# Patient Record
Sex: Male | Born: 1965 | Race: Black or African American | Hispanic: No | Marital: Single | Smoking: Former smoker
Health system: Southern US, Community
[De-identification: ages and names within clinical notes are randomized; demographics above are authoritative.]

## PROBLEM LIST (undated history)

## (undated) DIAGNOSIS — N289 Disorder of kidney and ureter, unspecified: Secondary | ICD-10-CM

## (undated) DIAGNOSIS — Z89519 Acquired absence of unspecified leg below knee: Secondary | ICD-10-CM

## (undated) DIAGNOSIS — M1990 Unspecified osteoarthritis, unspecified site: Secondary | ICD-10-CM

## (undated) DIAGNOSIS — F419 Anxiety disorder, unspecified: Secondary | ICD-10-CM

## (undated) DIAGNOSIS — G629 Polyneuropathy, unspecified: Secondary | ICD-10-CM

## (undated) DIAGNOSIS — E119 Type 2 diabetes mellitus without complications: Secondary | ICD-10-CM

## (undated) HISTORY — PX: REPLACEMENT TOTAL KNEE: SUR1224

---

## 2010-12-15 ENCOUNTER — Emergency Department (HOSPITAL_BASED_OUTPATIENT_CLINIC_OR_DEPARTMENT_OTHER)
Admission: EM | Admit: 2010-12-15 | Discharge: 2010-12-16 | Payer: Self-pay | Source: Home / Self Care | Admitting: Emergency Medicine

## 2010-12-16 ENCOUNTER — Inpatient Hospital Stay (HOSPITAL_COMMUNITY)
Admission: EM | Admit: 2010-12-16 | Discharge: 2010-12-18 | Payer: Self-pay | Attending: Internal Medicine | Admitting: Internal Medicine

## 2010-12-17 ENCOUNTER — Encounter (INDEPENDENT_AMBULATORY_CARE_PROVIDER_SITE_OTHER): Payer: Self-pay | Admitting: Internal Medicine

## 2011-01-10 NOTE — Discharge Summary (Addendum)
David Walsh, David Walsh                ACCOUNT NO.:  [id number]  MEDICAL RECORD NO.:  [id number]          PATIENT TYPE:  INP  LOCATION:  [id number]                         FACILITY:  [facility]  PHYSICIAN:  Romero Belling, MD       DATE OF BIRTH:  Oct 26, 1966  DATE OF ADMISSION:  12/16/2010 DATE OF DISCHARGE:  12/18/2010                        DISCHARGE SUMMARY - REFERRING   PRIMARY CARE PHYSICIAN:  At [facility].  DISCHARGE DIAGNOSES: 1. Hypertension. 2. Probable passed stone with right perinephric stranding, stable. 3. Mild rhabdomyolysis. 4. Type 2 diabetes mellitus. 5. Acute renal failure on chronic kidney disease. 6. Hypoalbuminemic state with possible nephrotic syndrome.  DISCHARGE MEDICATIONS:  For the patient are as follows: 1. Amlodipine 10 mg p.o. daily. 2. Hydralazine 25 mg p.o. 3 times a day. 3. K-Dur 20 mEq by mouth daily. 4. Lasix 40 mg p.o. daily. 5. Labetalol 200 mg by mouth twice daily. 6. NovoLog 70/30 twenty units subcutaneously 3 times a day.  DISPOSITION:  The patient is discharged home.  The patient should follow up with his primary care physician within 1 week.  The patient should obtain a referral to see a nephrologist.  The patient should establish care with a nephrologist, as the patient has chronic kidney disease and the patient could possibly have nephrotic syndrome.  Diet should be carbohydrate-modified.  PROCEDURES PERFORMED:  The patient had a CT scan of the abdomen and pelvis without contrast which showed mild right renal enlargement and perinephric stranding, but no hydronephrosis or radiopaque renal or ureteral calculus.  Findings could represent a recently passed stone or nonradiopaque calculus.  Correlate with urinalysis for possible evidence for infection that could suggest pyelonephritis.  The patient's chest x- ray 2-view showed cardiomegaly with probable mild pulmonary venous congestion.  The patient had an ultrasound of the  abdomen which showed no hydronephrosis in the right and left kidney.  CONSULTATIONS ON THIS CASE:  None.  INITIAL HISTORY AND PHYSICAL:  CHIEF COMPLAINT:  Right flank pain, hypertensive, hematuria.  This 45 year old male with a history of obesity, hypertension, and diabetes presents to [facility] with right flank pain.  The pain was nonradiating and was rather constant.  He has not had fevers or chills.  The patient had a CT scan of the abdomen and pelvis which showed stranding in the right perinephric area.  Urinalysis showed hematuria, as well as protein.  HOSPITAL COURSE: 1. Hypertension.  The patient had elevated blood pressure.  The     patient was receiving IV fluids early on the admission.  The     patient has been started on new antihypertensives with amlodipine     and hydralazine, as well as labetalol.  The patient's Lasix dose     has been increased from home. 2. Probable passed stone and right perinephric stranding.  This is     most likely a stone that has passed.  The patient should drink     plenty of fluids.  The patient had a CT and there was no evidence     of having any stone. 3. Mild rhabdomyolysis.  The  patient has mild rhabdomyolysis.  The     patient's CPK was elevated above 1000.  This should resolve with     time. 4. Type 2 diabetes mellitus.  The patient received Lantus and sliding     scale during the hospitalization.  The patient can go home and the     patient can resume his 70/30 insulin at home. 5. Acute renal failure on chronic kidney disease.  We do not have any     baseline kidney functions for the patient.  It is assumed that the     patient has chronic kidney disease.  The patient's creatinine has     ranged from 2.2 up to 2.31 during the hospitalization.  The patient     also has lower extremity edema.  The patient has had greater than     300 protein on the urinalysis.  Also the patient has a low albumin.     The patient had a 2-D  echocardiogram during this admission, which     showed a left ventricular ejection fraction that was normal and     some grade 1 diastolic dysfunction.  I believe the lower extremity     edema is most likely secondary to a hypoalbuminemic state.  This     could probably represent nephrotic syndrome.  That is why I am     recommending that the patient follow up with a nephrologist.     Romero Belling, MD     NH/MEDQ  D:  12/18/2010  T:  12/18/2010  Job:  [id number]  cc:   [facility]  Electronically Signed by Romero Belling MD on 01/10/2011 06:15:49 PM

## 2011-02-26 LAB — DRUGS OF ABUSE SCREEN W/O ALC, ROUTINE URINE
Benzodiazepines.: NEGATIVE
Cocaine Metabolites: NEGATIVE
Creatinine,U: 191.1 mg/dL
Methadone: NEGATIVE
Opiate Screen, Urine: NEGATIVE
Phencyclidine (PCP): NEGATIVE

## 2011-02-26 LAB — CBC
HCT: 31.8 % — ABNORMAL LOW (ref 39.0–52.0)
Hemoglobin: 10 g/dL — ABNORMAL LOW (ref 13.0–17.0)
Hemoglobin: 10.5 g/dL — ABNORMAL LOW (ref 13.0–17.0)
Hemoglobin: 10.8 g/dL — ABNORMAL LOW (ref 13.0–17.0)
MCH: 26.9 pg (ref 26.0–34.0)
MCV: 79.1 fL (ref 78.0–100.0)
MCV: 83 fL (ref 78.0–100.0)
Platelets: 246 10*3/uL (ref 150–400)
RBC: 3.64 MIL/uL — ABNORMAL LOW (ref 4.22–5.81)
RBC: 3.83 MIL/uL — ABNORMAL LOW (ref 4.22–5.81)
RBC: 4.02 MIL/uL — ABNORMAL LOW (ref 4.22–5.81)
WBC: 8.3 10*3/uL (ref 4.0–10.5)
WBC: 8.6 10*3/uL (ref 4.0–10.5)

## 2011-02-26 LAB — BASIC METABOLIC PANEL
CO2: 26 mEq/L (ref 19–32)
Chloride: 107 mEq/L (ref 96–112)
GFR calc Af Amer: 37 mL/min — ABNORMAL LOW (ref >60)
GFR calc Af Amer: 39 mL/min — ABNORMAL LOW (ref >60)
GFR calc non Af Amer: 30 mL/min — ABNORMAL LOW (ref >60)
Potassium: 3.5 mEq/L (ref 3.5–5.1)
Sodium: 138 mEq/L (ref 135–145)
Sodium: 139 mEq/L (ref 135–145)

## 2011-02-26 LAB — IRON AND TIBC
Iron: 37 ug/dL — ABNORMAL LOW (ref 42–135)
Saturation Ratios: 15 % — ABNORMAL LOW (ref 20–55)
TIBC: 243 ug/dL (ref 215–435)

## 2011-02-26 LAB — COMPREHENSIVE METABOLIC PANEL
ALT: 25 U/L (ref 0–53)
Albumin: 2 g/dL — ABNORMAL LOW (ref 3.5–5.2)
Alkaline Phosphatase: 73 U/L (ref 39–117)
Chloride: 108 mEq/L (ref 96–112)
Potassium: 3.4 mEq/L — ABNORMAL LOW (ref 3.5–5.1)
Sodium: 138 mEq/L (ref 135–145)
Total Protein: 5.2 g/dL — ABNORMAL LOW (ref 6.0–8.3)

## 2011-02-26 LAB — URINALYSIS, ROUTINE W REFLEX MICROSCOPIC
Bilirubin Urine: NEGATIVE
Protein, ur: >300 mg/dL — AB
Urobilinogen, UA: 0.2 mg/dL (ref 0.0–1.0)

## 2011-02-26 LAB — CARDIAC PANEL(CRET KIN+CKTOT+MB+TROPI)
CK, MB: 13.6 ng/mL (ref 0.3–4.0)
CK, MB: 14.6 ng/mL (ref 0.3–4.0)
Total CK: 1611 U/L — ABNORMAL HIGH (ref 7–232)
Total CK: 1745 U/L — ABNORMAL HIGH (ref 7–232)
Troponin I: 0.05 ng/mL (ref 0.00–0.06)
Troponin I: 0.05 ng/mL (ref 0.00–0.06)

## 2011-02-26 LAB — RETICULOCYTES: Retic Ct Pct: 1.2 % (ref 0.4–3.1)

## 2011-02-26 LAB — HEPATIC FUNCTION PANEL
Albumin: 2.3 g/dL — ABNORMAL LOW (ref 3.5–5.2)
Total Bilirubin: 0.2 mg/dL — ABNORMAL LOW (ref 0.3–1.2)
Total Protein: 5.4 g/dL — ABNORMAL LOW (ref 6.0–8.3)

## 2011-02-26 LAB — DIFFERENTIAL
Basophils Relative: 0 % (ref 0–1)
Eosinophils Absolute: 0.3 10*3/uL (ref 0.0–0.7)
Eosinophils Absolute: 0.3 10*3/uL (ref 0.0–0.7)
Eosinophils Relative: 3 % (ref 0–5)
Eosinophils Relative: 3 % (ref 0–5)
Lymphs Abs: 2.1 10*3/uL (ref 0.7–4.0)
Monocytes Absolute: 0.7 10*3/uL (ref 0.1–1.0)
Monocytes Relative: 8 % (ref 3–12)
Monocytes Relative: 9 % (ref 3–12)

## 2011-02-26 LAB — FERRITIN: Ferritin: 84 ng/mL (ref 22–322)

## 2011-02-26 LAB — GLUCOSE, CAPILLARY
Glucose-Capillary: 147 mg/dL — ABNORMAL HIGH (ref 70–99)
Glucose-Capillary: 179 mg/dL — ABNORMAL HIGH (ref 70–99)

## 2011-02-26 LAB — HEMOGLOBIN A1C
Hgb A1c MFr Bld: 7.7 % — ABNORMAL HIGH (ref <5.7)
Mean Plasma Glucose: 174 mg/dL — ABNORMAL HIGH (ref <117)

## 2011-02-26 LAB — URINE MICROSCOPIC-ADD ON

## 2011-02-26 LAB — LIPID PANEL
LDL Cholesterol: 226 mg/dL — ABNORMAL HIGH (ref 0–99)
VLDL: 26 mg/dL (ref 0–40)

## 2011-02-26 LAB — FOLATE: Folate: 5.7 ng/mL

## 2011-02-26 LAB — CK: Total CK: 1963 U/L — ABNORMAL HIGH (ref 7–232)

## 2011-02-26 LAB — VITAMIN B12: Vitamin B-12: 330 pg/mL (ref 211–911)

## 2012-12-17 HISTORY — PX: BELOW KNEE LEG AMPUTATION: SUR23

## 2016-12-28 DIAGNOSIS — Z7682 Awaiting organ transplant status: Secondary | ICD-10-CM | POA: Diagnosis not present

## 2017-03-06 DIAGNOSIS — Z7682 Awaiting organ transplant status: Secondary | ICD-10-CM | POA: Diagnosis not present

## 2018-07-15 ENCOUNTER — Emergency Department (HOSPITAL_COMMUNITY): Payer: Medicare Other

## 2018-07-15 ENCOUNTER — Inpatient Hospital Stay (HOSPITAL_COMMUNITY)
Admission: EM | Admit: 2018-07-15 | Discharge: 2018-07-23 | DRG: 602 | Disposition: A | Discharge status: Home / Self Care | Payer: Medicare Other | Attending: Family Medicine | Admitting: Family Medicine

## 2018-07-15 ENCOUNTER — Other Ambulatory Visit: Payer: Self-pay

## 2018-07-15 ENCOUNTER — Encounter (HOSPITAL_COMMUNITY): Payer: Self-pay | Admitting: Emergency Medicine

## 2018-07-15 DIAGNOSIS — Z4901 Encounter for fitting and adjustment of extracorporeal dialysis catheter: Secondary | ICD-10-CM | POA: Diagnosis not present

## 2018-07-15 DIAGNOSIS — Z89511 Acquired absence of right leg below knee: Secondary | ICD-10-CM | POA: Diagnosis not present

## 2018-07-15 DIAGNOSIS — I10 Essential (primary) hypertension: Secondary | ICD-10-CM | POA: Diagnosis present

## 2018-07-15 DIAGNOSIS — M898X9 Other specified disorders of bone, unspecified site: Secondary | ICD-10-CM | POA: Diagnosis present

## 2018-07-15 DIAGNOSIS — Z880 Allergy status to penicillin: Secondary | ICD-10-CM

## 2018-07-15 DIAGNOSIS — D631 Anemia in chronic kidney disease: Secondary | ICD-10-CM | POA: Diagnosis present

## 2018-07-15 DIAGNOSIS — G8929 Other chronic pain: Secondary | ICD-10-CM | POA: Diagnosis present

## 2018-07-15 DIAGNOSIS — L02212 Cutaneous abscess of back [any part, except buttock]: Principal | ICD-10-CM | POA: Diagnosis present

## 2018-07-15 DIAGNOSIS — E1165 Type 2 diabetes mellitus with hyperglycemia: Secondary | ICD-10-CM | POA: Diagnosis present

## 2018-07-15 DIAGNOSIS — L0291 Cutaneous abscess, unspecified: Secondary | ICD-10-CM | POA: Diagnosis not present

## 2018-07-15 DIAGNOSIS — R109 Unspecified abdominal pain: Secondary | ICD-10-CM | POA: Diagnosis present

## 2018-07-15 DIAGNOSIS — T827XXA Infection and inflammatory reaction due to other cardiac and vascular devices, implants and grafts, initial encounter: Secondary | ICD-10-CM

## 2018-07-15 DIAGNOSIS — E1142 Type 2 diabetes mellitus with diabetic polyneuropathy: Secondary | ICD-10-CM | POA: Diagnosis present

## 2018-07-15 DIAGNOSIS — B957 Other staphylococcus as the cause of diseases classified elsewhere: Secondary | ICD-10-CM | POA: Diagnosis present

## 2018-07-15 DIAGNOSIS — R7881 Bacteremia: Secondary | ICD-10-CM

## 2018-07-15 DIAGNOSIS — R1032 Left lower quadrant pain: Secondary | ICD-10-CM | POA: Diagnosis not present

## 2018-07-15 DIAGNOSIS — A419 Sepsis, unspecified organism: Secondary | ICD-10-CM | POA: Diagnosis not present

## 2018-07-15 DIAGNOSIS — N19 Unspecified kidney failure: Secondary | ICD-10-CM

## 2018-07-15 DIAGNOSIS — I120 Hypertensive chronic kidney disease with stage 5 chronic kidney disease or end stage renal disease: Secondary | ICD-10-CM | POA: Diagnosis not present

## 2018-07-15 DIAGNOSIS — N186 End stage renal disease: Secondary | ICD-10-CM | POA: Diagnosis present

## 2018-07-15 DIAGNOSIS — F17290 Nicotine dependence, other tobacco product, uncomplicated: Secondary | ICD-10-CM | POA: Diagnosis not present

## 2018-07-15 DIAGNOSIS — R1030 Lower abdominal pain, unspecified: Secondary | ICD-10-CM | POA: Diagnosis not present

## 2018-07-15 DIAGNOSIS — Z794 Long term (current) use of insulin: Secondary | ICD-10-CM

## 2018-07-15 DIAGNOSIS — Z6833 Body mass index (BMI) 33.0-33.9, adult: Secondary | ICD-10-CM | POA: Diagnosis not present

## 2018-07-15 DIAGNOSIS — E1122 Type 2 diabetes mellitus with diabetic chronic kidney disease: Secondary | ICD-10-CM | POA: Diagnosis not present

## 2018-07-15 DIAGNOSIS — Z992 Dependence on renal dialysis: Secondary | ICD-10-CM | POA: Diagnosis not present

## 2018-07-15 DIAGNOSIS — L02219 Cutaneous abscess of trunk, unspecified: Secondary | ICD-10-CM | POA: Diagnosis not present

## 2018-07-15 DIAGNOSIS — N62 Hypertrophy of breast: Secondary | ICD-10-CM | POA: Diagnosis present

## 2018-07-15 DIAGNOSIS — E669 Obesity, unspecified: Secondary | ICD-10-CM | POA: Diagnosis present

## 2018-07-15 DIAGNOSIS — Z91013 Allergy to seafood: Secondary | ICD-10-CM | POA: Diagnosis not present

## 2018-07-15 DIAGNOSIS — B9689 Other specified bacterial agents as the cause of diseases classified elsewhere: Secondary | ICD-10-CM | POA: Diagnosis not present

## 2018-07-15 DIAGNOSIS — N2581 Secondary hyperparathyroidism of renal origin: Secondary | ICD-10-CM | POA: Diagnosis present

## 2018-07-15 DIAGNOSIS — I5030 Unspecified diastolic (congestive) heart failure: Secondary | ICD-10-CM | POA: Diagnosis not present

## 2018-07-15 DIAGNOSIS — I4891 Unspecified atrial fibrillation: Secondary | ICD-10-CM | POA: Diagnosis present

## 2018-07-15 DIAGNOSIS — L0390 Cellulitis, unspecified: Secondary | ICD-10-CM | POA: Diagnosis present

## 2018-07-15 DIAGNOSIS — E1129 Type 2 diabetes mellitus with other diabetic kidney complication: Secondary | ICD-10-CM

## 2018-07-15 DIAGNOSIS — E039 Hypothyroidism, unspecified: Secondary | ICD-10-CM | POA: Diagnosis present

## 2018-07-15 DIAGNOSIS — R1031 Right lower quadrant pain: Secondary | ICD-10-CM | POA: Diagnosis not present

## 2018-07-15 HISTORY — DX: Disorder of kidney and ureter, unspecified: N28.9

## 2018-07-15 HISTORY — DX: Anxiety disorder, unspecified: F41.9

## 2018-07-15 HISTORY — DX: Polyneuropathy, unspecified: G62.9

## 2018-07-15 HISTORY — DX: Type 2 diabetes mellitus without complications: E11.9

## 2018-07-15 HISTORY — DX: Unspecified osteoarthritis, unspecified site: M19.90

## 2018-07-15 HISTORY — DX: Acquired absence of unspecified leg below knee: Z89.519

## 2018-07-15 LAB — CBC WITH DIFFERENTIAL/PLATELET
BASOS ABS: 0 10*3/uL (ref 0.0–0.1)
BASOS PCT: 0 %
EOS PCT: 4 %
Eosinophils Absolute: 0.3 10*3/uL (ref 0.0–0.7)
HCT: 36.1 % — ABNORMAL LOW (ref 39.0–52.0)
Hemoglobin: 12.3 g/dL — ABNORMAL LOW (ref 13.0–17.0)
Lymphocytes Relative: 21 %
Lymphs Abs: 1.5 10*3/uL (ref 0.7–4.0)
MCH: 29.8 pg (ref 26.0–34.0)
MCHC: 34.1 g/dL (ref 30.0–36.0)
MCV: 87.4 fL (ref 78.0–100.0)
MONO ABS: 0.4 10*3/uL (ref 0.1–1.0)
Monocytes Relative: 5 %
Neutro Abs: 4.8 10*3/uL (ref 1.7–7.7)
Neutrophils Relative %: 70 %
PLATELETS: 262 10*3/uL (ref 150–400)
RBC: 4.13 MIL/uL — AB (ref 4.22–5.81)
RDW: 14.1 % (ref 11.5–15.5)
WBC: 6.9 10*3/uL (ref 4.0–10.5)

## 2018-07-15 LAB — LIPASE, BLOOD: LIPASE: 54 U/L — AB (ref 11–51)

## 2018-07-15 LAB — COMPREHENSIVE METABOLIC PANEL
ALBUMIN: 3.5 g/dL (ref 3.5–5.0)
ALK PHOS: 139 U/L — AB (ref 38–126)
ALT: 16 U/L (ref 0–44)
AST: 23 U/L (ref 15–41)
Anion gap: 12 (ref 5–15)
BILIRUBIN TOTAL: 0.5 mg/dL (ref 0.3–1.2)
BUN: 43 mg/dL — AB (ref 6–20)
CALCIUM: 9.1 mg/dL (ref 8.9–10.3)
CO2: 25 mmol/L (ref 22–32)
Chloride: 95 mmol/L — ABNORMAL LOW (ref 98–111)
Creatinine, Ser: 7.86 mg/dL — ABNORMAL HIGH (ref 0.61–1.24)
GFR calc Af Amer: 8 mL/min — ABNORMAL LOW (ref >60)
GFR, EST NON AFRICAN AMERICAN: 7 mL/min — AB (ref >60)
GLUCOSE: 339 mg/dL — AB (ref 70–99)
Potassium: 5 mmol/L (ref 3.5–5.1)
Sodium: 132 mmol/L — ABNORMAL LOW (ref 135–145)
TOTAL PROTEIN: 7.7 g/dL (ref 6.5–8.1)

## 2018-07-15 LAB — I-STAT CG4 LACTIC ACID, ED
LACTIC ACID, VENOUS: 1.94 mmol/L — AB (ref 0.5–1.9)
Lactic Acid, Venous: 1.63 mmol/L (ref 0.5–1.9)

## 2018-07-15 MED ORDER — SODIUM CHLORIDE 0.9 % IV SOLN
2.0000 g | Freq: Every day | INTRAVENOUS | Status: DC
  Administered 2018-07-16: 2 g via INTRAVENOUS
  Filled 2018-07-15 (×2): qty 20

## 2018-07-15 MED ORDER — LEVOFLOXACIN IN D5W 750 MG/150ML IV SOLN
750.0000 mg | Freq: Once | INTRAVENOUS | Status: AC
  Administered 2018-07-15: 750 mg via INTRAVENOUS
  Filled 2018-07-15: qty 150

## 2018-07-15 MED ORDER — FENTANYL CITRATE (PF) 100 MCG/2ML IJ SOLN
50.0000 ug | Freq: Once | INTRAMUSCULAR | Status: AC
  Administered 2018-07-15: 50 ug via INTRAVENOUS
  Filled 2018-07-15: qty 2

## 2018-07-15 MED ORDER — VANCOMYCIN HCL IN DEXTROSE 1-5 GM/200ML-% IV SOLN
1000.0000 mg | Freq: Once | INTRAVENOUS | Status: AC
  Administered 2018-07-15: 1000 mg via INTRAVENOUS
  Filled 2018-07-15: qty 200

## 2018-07-15 MED ORDER — SODIUM CHLORIDE 0.9 % IV SOLN
2.0000 g | Freq: Once | INTRAVENOUS | Status: AC
  Administered 2018-07-15: 2 g via INTRAVENOUS
  Filled 2018-07-15: qty 2

## 2018-07-15 MED ORDER — LIDOCAINE-EPINEPHRINE (PF) 2 %-1:200000 IJ SOLN
10.0000 mL | Freq: Once | INTRAMUSCULAR | Status: AC
  Administered 2018-07-15: 10 mL
  Filled 2018-07-15: qty 20

## 2018-07-15 NOTE — ED Notes (Signed)
Turkey sandwich given to pt.  

## 2018-07-15 NOTE — H&P (Signed)
History and Physical    David Walsh [id number] DOB: 03-30-1966 DOA: 07/15/2018  PCP: [facility]   Patient coming from: Home   Chief Complaint: Abdominal Pain, flank pain, back abscess.  HPI: David Walsh is a 52 y.o. male with medical history significant for DM, HTN, ESRD, BKA, to the ED today with multiple complaints.  Patient reports abdominal pain, RLQ of 1 month duration with associated localized swelling, at the site where he had HD access in the recent past.  Patient also reports left lower abdominal pain radiating to his groin of about 1 week duration. No back pain. He denies fevers, or shaking chills.  Patient also c/o back abscess of 2 months duration foer which he has at least 2 courses of antibiotics given at the [place] and [place] or [place]. He cannot remember the name of the medications.  ED Course: tachycardia- 109, initial tachypnea- 22, Ct abd - No acute abnormality, new L2 sub-centimeter lucent foci- ?brown tumors in the setting of ESRD (see detailed report). 2 view chest xray- clear.  Lactic acid- 1.9>> 1.63.  Patient was started on broad-spectrum antibiotics-IV Vanco IV aztreonam and IV Levaquin, for initial concern for sepsis.  Patient had I&D done of his back abscess, with purulent drainage.  Sample cultures pending.   Review of Systems: As per HPI otherwise other systems reviewed and are negative and are negative.   Past Medical History:  Diagnosis Date  . Anxiety   . Diabetes mellitus without complication ([place])   . Hx of BKA ([place])   . Neuropathy   . Renal disorder     Allergies  Allergen Reactions  . Penicillins   . Shellfish Allergy    Family history of hypertension  Prior to Admission medications   Not on File    Physical Exam: Vitals:   07/15/18 1741 07/15/18 1800 07/15/18 1830 07/15/18 1930  BP: (!) 164/79 (!) 155/82 (!) 141/89 (!) 156/93  Pulse: 99 99 96 (!) 102  Resp: 16   18  Temp:      TempSrc:      SpO2: 94% 93% 94% 97%  Weight:       Height:        Constitutional: NAD, calm, comfortable Vitals:   07/15/18 1741 07/15/18 1800 07/15/18 1830 07/15/18 1930  BP: (!) 164/79 (!) 155/82 (!) 141/89 (!) 156/93  Pulse: 99 99 96 (!) 102  Resp: 16   18  Temp:      TempSrc:      SpO2: 94% 93% 94% 97%  Weight:      Height:       Eyes: PERRL, lids and conjunctivae normal ENMT: Mucous membranes are moist. Posterior pharynx clear of any exudate or lesions.Normal dentition.  Neck: normal, supple, no masses, no thyromegaly Respiratory: clear to auscultation bilaterally, no wheezing, no crackles. Normal respiratory effort. No accessory muscle use.  HD access site right upper chest, without surrounding erythema or tenderness. Cardiovascular: Regular rate and rhythm, no murmurs / rubs / gallops. No extremity edema. 2+ pedal pulses. No carotid bruits.  Back-    ~3 cm wound middle upper back, with mild surrounding erythema and tenderness, no fluctuance appreciated.  Abdomen: no tenderness, no masses palpated. No hepatosplenomegaly. Bowel sounds positive.  Musculoskeletal: no clubbing / cyanosis. Right BKA. Skin: no rashes, lesions, ulcers. No induration Neurologic: CN 2-12 grossly intact. Strength 5/5 in all 3-upper and left lower extremities.  Psychiatric: Normal judgment and insight. Alert and oriented x 3. Normal mood.  Labs on Admission: I have personally reviewed following labs and imaging studies  CBC: Recent Labs  Lab 07/15/18 1643  WBC 6.9  NEUTROABS 4.8  HGB 12.3*  HCT 36.1*  MCV 87.4  PLT 852   Basic Metabolic Panel: Recent Labs  Lab 07/15/18 1643  NA 132*  K 5.0  CL 95*  CO2 25  GLUCOSE 339*  BUN 43*  CREATININE 7.86*  CALCIUM 9.1   Liver Function Tests: Recent Labs  Lab 07/15/18 1643  AST 23  ALT 16  ALKPHOS 139*  BILITOT 0.5  PROT 7.7  ALBUMIN 3.5   Recent Labs  Lab 07/15/18 1643  LIPASE 54*   Urine analysis:    Component Value Date/Time   COLORURINE YELLOW 12/15/2010 2309    APPEARANCEUR CLEAR 12/15/2010 2309   LABSPEC 1.021 12/15/2010 2309   PHURINE 6.5 12/15/2010 2309   GLUCOSEU 500 (A) 12/15/2010 2309   HGBUR LARGE (A) 12/15/2010 2309   BILIRUBINUR NEGATIVE 12/15/2010 2309   KETONESUR NEGATIVE 12/15/2010 2309   PROTEINUR >300 (A) 12/15/2010 2309   UROBILINOGEN 0.2 12/15/2010 2309   NITRITE NEGATIVE 12/15/2010 2309   LEUKOCYTESUR NEGATIVE 12/15/2010 2309    Radiological Exams on Admission: Ct Abdomen Pelvis Wo Contrast  Result Date: 07/15/2018 CLINICAL DATA:  52 y/o M; right lower quadrant abdominal pain radiating to the left flank for 2 months. Reports mass to the right abdomen. EXAM: CT ABDOMEN AND PELVIS WITHOUT CONTRAST TECHNIQUE: Multidetector CT imaging of the abdomen and pelvis was performed following the standard protocol without IV contrast. COMPARISON:  12/16/2010 CT abdomen and pelvis. FINDINGS: Lower chest: Severe coronary artery calcific atherosclerosis and dense mitral annular calcification. Right central venous catheter tip projects over the right atrium. 2-3 mm nodules at the lung bases, some obscured by atelectasis on the prior CT. Hepatobiliary: No focal liver abnormality is seen. No gallstones, gallbladder wall thickening, or biliary dilatation. Pancreas: Unremarkable. No pancreatic ductal dilatation or surrounding inflammatory changes. Spleen: Normal in size without focal abnormality. Adrenals/Urinary Tract: Adrenal glands are unremarkable. Atrophic kidneys bilaterally. Kidneys are otherwise normal, without renal calculi, focal lesion, or hydronephrosis. Bladder is unremarkable. Stomach/Bowel: Stomach is within normal limits. Appendix appears normal. No evidence of bowel wall thickening, distention, or inflammatory changes. Vascular/Lymphatic: Aortic atherosclerosis. No enlarged abdominal or pelvic lymph nodes. Reproductive: Prostate is unremarkable. Other: No abdominal wall hernia or abnormality. No abdominopelvic ascites. Musculoskeletal: New L2  vertebral body subcentimeter lucent foci (series 4, image 90). No acute fracture. Increased density of bones. IMPRESSION: 1. No acute process identified. 2. 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 Differential includes lytic metastasis or myeloma. No direct connection to the disc space identified to suggest Schmorl's node. Follow-up recommended to ensure stability. 3. Severe coronary and aortic calcific atherosclerosis. Electronically Signed   By: Kristine Garbe M.D.   On: 07/15/2018 17:46   Dg Chest 2 View  Result Date: 07/15/2018 CLINICAL DATA:  Sepsis.  Right lower quadrant pain. EXAM: CHEST - 2 VIEW COMPARISON:  12/16/2010 FINDINGS: There is a right jugular dialysis catheter with the tip extending into the right atrium. Low lung volumes. Left basilar densities are most compatible with atelectasis/scar and may be chronic. Cardiac silhouette appears to be within normal limits and stable but poorly characterized on this low lung volume examination. Negative for pneumothorax. No significant airspace disease or pulmonary edema. No large pleural effusions. IMPRESSION: Low lung volumes without acute findings. Presence of a dialysis catheter. Electronically Signed  By: Markus Daft M.D.   On: 07/15/2018 17:07    EKG: Independently reviewed.  Polarization abnormalities no old EKG to compare.  QTc 484  Assessment/Plan Principal Problem:   Abscess Active Problems:   DM (diabetes mellitus) (HCC)   HTN (hypertension)   ESRD (end stage renal disease) (HCC)   Abdominal pain   Hx of BKA, right (HCC)   Abscess-2 months duration.  Failed outpatient antibiotic therapy. Purulent drainage status post I&D in the ED. WBC- 6.9.  Lactic acid 1.9.> 1.6.  diabetic.  -Follow-up cultures from I&D -Follow blood cultures -IV Vanco per pharmacy and IV ceftriaxone (prior penicillin allergy- per patient, itching and rash, noted)  Abdominal  pain-CT abdomen and pelvis -no acute abnormality (detailed report), right lower quadrant pain possibly related to scarring from recent HD access.  Also no tenderness to explain pain in left groin. 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 Differential includes lytic metastasis or myeloma. No direct connection to the disc space identified to suggest Schmorl's node. Follow-up recommended to ensure stability.  ESRD- T TH sat.  Last HD session today.  Patient states he would like to have [facility] next [facility] on Thursday, even if he is discharged at day -Nephrology consult in a.m.  DM-  Glucose elevated 339.  Patient reports he is on sliding scale at home -Hgb A1c - SSi- s  HTN-elevated.  Reports he is off antihypertensives. -Monitor   DVT prophylaxis: Lovenox Code Status: full Family Communication: Spouse at bedside Disposition Plan:  Per rounding team Consults called: None Admission status: inpt, tele   Bethena Roys MD [facility] Pager [phone] From 6PM-2AM.  Otherwise please contact night-coverage [email or web address] Password [facility]  07/15/2018, 9:51 PM

## 2018-07-15 NOTE — ED Notes (Signed)
Attempted to call report. Receiving RN was not available at this time.

## 2018-07-15 NOTE — ED Triage Notes (Addendum)
Patient c/o RLQ pain radiating to left flank x2 months.Reports mass to right abdomen. Reports he had dialysis today. Also adds intermittent N/V/D x2 months. Patient also c/o abscess to upper back.

## 2018-07-15 NOTE — Progress Notes (Signed)
A consult was received from an ED physician for vancomycin, aztreonam, and levofloxacin per pharmacy dosing.  The patient's profile has been reviewed for ht/wt/allergies/indication/available labs.   Height/weight not currently in the chart, ordered as STAT  A one time order has been placed for levofloxacin 750 mg IV, vancomycin 1000 mg IV, and aztreonam 2 g IV.  Further antibiotics/pharmacy consults should be ordered by admitting physician if indicated.                       Thank you, Lenis Noon, PharmD 07/15/2018  4:23 PM

## 2018-07-15 NOTE — ED Notes (Signed)
Patient refused rectal temp

## 2018-07-15 NOTE — ED Provider Notes (Signed)
[facility] DEPT Provider Note   CSN: [id number] Arrival date & time: 07/15/18  1423     History   Chief Complaint Chief Complaint  Patient presents with  . Flank Pain  . Abdominal Pain    HPI David Walsh is a 52 y.o. male with past medical history of DM, anxiety, BKA, ESRD on HD, who presents today for evaluation of abdominal pain, flank pain, and abscess.  His abdominal pain is in the right lower quadrant.  It is been present for 2 months however became significantly worse over the past few days.  He reports intermittent nausea.  He was able to attend dialysis this morning for his entire session.  He reports that he has had multiple CAT scans recently of this area, however he reports the only thing he knows is that they were fine.  He does not know if he has a hernia in this area.  He used to have peritoneal dialysis access in this area.  He reports that he feels like he might be constipated, however reports last bowel movement was this morning.  He does report that he still makes small amounts of urine.  Left-sided flank pain: He reports that this is been present for the past few days and gradually worsening.  He reports that pain in this area is unusual for him.  Abscess.  He reports that he has a wound that is draining pus on his upper back.  This is been present for over a month.  He has been treated with 2 different antibiotics by different facilities and it has not improved.  He reports that he is supposed to go back this week for recheck.  He has never had it drained.    HPI  Past Medical History:  Diagnosis Date  . Anxiety   . Diabetes mellitus without complication ([place])   . Hx of BKA ([place])   . Neuropathy   . Renal disorder     Patient Active Problem List   Diagnosis Date Noted  . Abscess 07/15/2018  . DM (diabetes mellitus) ([place]) 07/15/2018  . HTN (hypertension) 07/15/2018  . ESRD (end stage renal disease) ([place]) 07/15/2018  .  Abdominal pain 07/15/2018  . Hx of BKA, right ([place]) 07/15/2018    History reviewed. No pertinent surgical history.      Home Medications    Prior to Admission medications   Not on File    Family History History reviewed. No pertinent family history.  Social History Social History   Tobacco Use  . Smoking status: Not on file  Substance Use Topics  . Alcohol use: Not on file  . Drug use: Not on file     Allergies   Penicillins and Shellfish allergy   Review of Systems Review of Systems  Constitutional: Positive for activity change, appetite change and chills. Negative for fever.  Respiratory: Negative for shortness of breath and wheezing.   Cardiovascular: Negative for chest pain.  Gastrointestinal: Positive for abdominal pain, constipation and nausea. Negative for diarrhea and vomiting.  Genitourinary: Negative for decreased urine volume.  Neurological: Negative for weakness.  All other systems reviewed and are negative.    Physical Exam Updated Vital Signs BP (!) 160/83   Pulse (!) 101   Temp 97.9 F (36.6 C) (Oral)   Resp 18   Ht 6\' 6"  (1.981 m)   Wt 126.6 kg (279 lb)   SpO2 92%   BMI 32.24 kg/m   Physical Exam  Constitutional:  He appears well-developed and well-nourished. No distress.  HENT:  Head: Normocephalic and atraumatic.  Eyes: Conjunctivae are normal. Right eye exhibits no discharge. Left eye exhibits no discharge. No scleral icterus.  Neck: Normal range of motion.  Cardiovascular: Normal rate and regular rhythm.  Pulmonary/Chest: Effort normal. No stridor. No respiratory distress.  Abdominal: Bowel sounds are normal. He exhibits no distension. There is generalized tenderness. There is no rigidity, no rebound and no guarding.  Abdomen is obese.  There is swelling and area of scarring in the right lower quadrant.  Palpation in this area does not reduce it.  Abdomen is generally tender to palpation.  There is no CVA tenderness to percussion.    Musculoskeletal: He exhibits no edema or deformity.  Right-sided BKA.  Neurological: He is alert. He exhibits normal muscle tone.  Skin: Skin is warm and dry. He is not diaphoretic.  There is a wound in patient's upper back.  There is purulent material slowly draining from the wound.  Mild surrounding fluctuance without abnormal erythema.  Psychiatric: He has a normal mood and affect. His behavior is normal.  Nursing note and vitals reviewed.    ED Treatments / Results  Labs (all labs ordered are listed, but only abnormal results are displayed) Labs Reviewed  COMPREHENSIVE METABOLIC PANEL - Abnormal; Notable for the following components:      Result Value   Sodium 132 (*)    Chloride 95 (*)    Glucose, Bld 339 (*)    BUN 43 (*)    Creatinine, Ser 7.86 (*)    Alkaline Phosphatase 139 (*)    GFR calc non Af Amer 7 (*)    GFR calc Af Amer 8 (*)    All other components within normal limits  CBC WITH DIFFERENTIAL/PLATELET - Abnormal; Notable for the following components:   RBC 4.13 (*)    Hemoglobin 12.3 (*)    HCT 36.1 (*)    All other components within normal limits  LIPASE, BLOOD - Abnormal; Notable for the following components:   Lipase 54 (*)    All other components within normal limits  I-STAT CG4 LACTIC ACID, ED - Abnormal; Notable for the following components:   Lactic Acid, Venous 1.94 (*)    All other components within normal limits  CULTURE, BLOOD (ROUTINE X 2)  CULTURE, BLOOD (ROUTINE X 2)  AEROBIC/ANAEROBIC CULTURE (SURGICAL/DEEP WOUND)  URINALYSIS, ROUTINE W REFLEX MICROSCOPIC  I-STAT CG4 LACTIC ACID, ED    EKG None  Radiology Ct Abdomen Pelvis Wo Contrast  Result Date: 07/15/2018 CLINICAL DATA:  52 y/o M; right lower quadrant abdominal pain radiating to the left flank for 2 months. Reports mass to the right abdomen. EXAM: CT ABDOMEN AND PELVIS WITHOUT CONTRAST TECHNIQUE: Multidetector CT imaging of the abdomen and pelvis was performed following the  standard protocol without IV contrast. COMPARISON:  12/16/2010 CT abdomen and pelvis. FINDINGS: Lower chest: Severe coronary artery calcific atherosclerosis and dense mitral annular calcification. Right central venous catheter tip projects over the right atrium. 2-3 mm nodules at the lung bases, some obscured by atelectasis on the prior CT. Hepatobiliary: No focal liver abnormality is seen. No gallstones, gallbladder wall thickening, or biliary dilatation. Pancreas: Unremarkable. No pancreatic ductal dilatation or surrounding inflammatory changes. Spleen: Normal in size without focal abnormality. Adrenals/Urinary Tract: Adrenal glands are unremarkable. Atrophic kidneys bilaterally. Kidneys are otherwise normal, without renal calculi, focal lesion, or hydronephrosis. Bladder is unremarkable. Stomach/Bowel: Stomach is within normal limits. Appendix appears normal. No evidence  of bowel wall thickening, distention, or inflammatory changes. Vascular/Lymphatic: Aortic atherosclerosis. No enlarged abdominal or pelvic lymph nodes. Reproductive: Prostate is unremarkable. Other: No abdominal wall hernia or abnormality. No abdominopelvic ascites. Musculoskeletal: New L2 vertebral body subcentimeter lucent foci (series 4, image 90). No acute fracture. Increased density of bones. IMPRESSION: 1. No acute process identified. 2. 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 Differential includes lytic metastasis or myeloma. No direct connection to the disc space identified to suggest Schmorl's node. Follow-up recommended to ensure stability. 3. Severe coronary and aortic calcific atherosclerosis. Electronically Signed   By: Kristine Garbe M.D.   On: 07/15/2018 17:46   Dg Chest 2 View  Result Date: 07/15/2018 CLINICAL DATA:  Sepsis.  Right lower quadrant pain. EXAM: CHEST - 2 VIEW COMPARISON:  12/16/2010 FINDINGS: There is a right jugular dialysis  catheter with the tip extending into the right atrium. Low lung volumes. Left basilar densities are most compatible with atelectasis/scar and may be chronic. Cardiac silhouette appears to be within normal limits and stable but poorly characterized on this low lung volume examination. Negative for pneumothorax. No significant airspace disease or pulmonary edema. No large pleural effusions. IMPRESSION: Low lung volumes without acute findings. Presence of a dialysis catheter. Electronically Signed   By: Markus Daft M.D.   On: 07/15/2018 17:07    Procedures .Marland KitchenIncision and Drainage Date/Time: 07/15/2018 7:30 PM Performed by: Lorin Glass, PA-C Authorized by: Lorin Glass, PA-C   Consent:    Consent obtained:  Verbal   Consent given by:  Patient   Risks discussed:  Bleeding, incomplete drainage, pain and infection (Damage to other structures, need for additional procedures)   Alternatives discussed:  No treatment, alternative treatment and referral Location:    Type:  Abscess   Size:  Tunnled subcutaneously. about 8cm in largest diameter.    Location:  Trunk   Trunk location:  Back Pre-procedure details:    Skin preparation:  Chloraprep Anesthesia (see MAR for exact dosages):    Anesthesia method:  Local infiltration   Local anesthetic:  Lidocaine 2% WITH epi Procedure type:    Complexity:  Complex Procedure details:    Incision types:  Stab incision   Incision depth:  Subcutaneous   Scalpel blade:  11   Wound management:  Probed and deloculated and irrigated with saline   Drainage:  Purulent   Drainage amount:  Moderate   Wound treatment:  Wound left open   Packing materials:  None Post-procedure details:    Patient tolerance of procedure:  Tolerated well, no immediate complications Comments:     Abscess was chronic in nature, appears to be tunneled in multiple directions/diffusely.  Drainage of purulent material and flushing was performed, I highly suspect he will have  a loculated area that will need additional drainage.    CRITICAL CARE Performed by: Wyn Quaker Total critical care time: 30 minutes Critical care time was exclusive of separately billable procedures and treating other patients. Critical care was necessary to treat or prevent imminent or life-threatening deterioration. Critical care was time spent personally by me on the following activities: development of treatment plan with patient and/or surrogate as well as nursing, discussions with consultants, evaluation of patient's response to treatment, examination of patient, obtaining history from patient or surrogate, ordering and performing treatments and interventions, ordering and review of laboratory studies, ordering and review of radiographic studies, pulse oximetry and re-evaluation of patient's condition.  Medications Ordered in ED Medications  levofloxacin (LEVAQUIN) IVPB 750 mg ( Intravenous Stopped 07/15/18 1917)  aztreonam (AZACTAM) 2 g in sodium chloride 0.9 % 100 mL IVPB ( Intravenous Stopped 07/15/18 2019)  vancomycin (VANCOCIN) IVPB 1000 mg/200 mL premix ( Intravenous Stopped 07/15/18 2204)  fentaNYL (SUBLIMAZE) injection 50 mcg (50 mcg Intravenous Given 07/15/18 1729)  lidocaine-EPINEPHrine (XYLOCAINE W/EPI) 2 %-1:200000 (PF) injection 10 mL (10 mLs Infiltration Given 07/15/18 1819)  fentaNYL (SUBLIMAZE) injection 50 mcg (50 mcg Intravenous Given 07/15/18 2101)     Initial Impression / Assessment and Plan / ED Course  I have reviewed the triage vital signs and the nursing notes.  Pertinent labs & imaging results that were available during my care of the patient were reviewed by me and considered in my medical decision making (see chart for details).  Clinical Course as of Jul 15 2213  Tue Jul 15, 2018  1739 Sepsis re-evaluation completed.    [EH]  2101 RN reported patient is a requesting additional pain medicine.  Hospitalist has not yet seen patient, therefore additional  fentanyl ordered.  Wound cultures were obtained from the wound, and more pus was able to be expressed from the wound however this was scant.   [EH]    Clinical Course User Index [EH] Lorin Glass, PA-C   Patient presents today for evaluation of flank and abdominal pain and mentions that he has a wound on his back.  Initially his presentation was concerning for sepsis, as he was both tachycardic and tachypneic.  He was afebrile with oral temperature and refused rectal temp. his white count is not elevated, therefore do not feel like his clinical picture remains consistent with sepsis.  He had initially been started on broad-spectrum antibiotics given complaints fighting both possible intra-abdominal and skin sources.  Patient reported abdominal pain in left flank, and right lower quadrant.  He is ESRD on HD and still makes some urine, therefore CT abdomen without contrast was obtained without cause for his symptoms found, he does have a new L2 lucency.    He does have an abscess on his back in the upper midline.  He has never had surgery in this area.  I&D was performed with copious amounts of purulent material despite not having a localized swelling.  Suspect that this abscess is more chronic given that he has had approximately 1 month of antibiotics and is still having symptoms.  Suspect that this is the cause for his tachycardia in addition to anxiety.  As his I&D was not localized due to the chronicity rather more spread out and diffuse in the subcutaneous tissues with multiple loculations I suspect that he will most likely require additional procedures to fully drain this.  Cultures were obtained from the wound.     Patient was not given 30/kg fluid bolus as his lactic was under 4 and he was not hypotensive.    I spoke with the hospitalist who agreed to admit patient.  As he appears to have failed outpatient antibiotic therapy with 2 different antibiotics.  Patient will need to be admitted  most [facility] to [facility] as he is a dialysis patient.   Final Clinical Impressions(s) / ED Diagnoses   Final diagnoses:  Abscess    ED Discharge Orders    None       Ollen Gross 07/15/18 2214    Dorie Rank, MD 07/15/18 2226

## 2018-07-15 NOTE — ED Notes (Signed)
ED TO INPATIENT HANDOFF REPORT  Name/Age/Gender David Walsh 52 y.o. male  Code Status Advance Directive Documentation     Most Recent Value  Type of Advance Directive  Living will, Healthcare Power of Attorney  Pre-existing out of facility DNR order (yellow form or pink MOST form)  -  "MOST" Form in Place?  -      Home/SNF/Other Home  Chief Complaint Stomach/Back Pain  Level of Care/Admitting Diagnosis ED Disposition    ED Disposition Condition [place]: [place] [100100]  Level of Care: Telemetry [5]  Diagnosis: Abscess [591638]  Admitting Physician: Bethena Roys [[id number]]  Attending Physician: Bethena Roys [phone]  Estimated length of stay: past midnight tomorrow  Certification:: I certify this patient will need inpatient services for at least 2 midnights  PT Class (Do Not Modify): Inpatient [101]  PT Acc Code (Do Not Modify): Private [1]       Medical History Past Medical History:  Diagnosis Date  . Anxiety   . Diabetes mellitus without complication ([place])   . Hx of BKA ([place])   . Neuropathy   . Renal disorder     Allergies Allergies  Allergen Reactions  . Penicillins   . Shellfish Allergy     IV Location/Drains/Wounds Patient Lines/Drains/Airways Status   Active Line/Drains/Airways    None          Labs/Imaging Results for orders placed or performed during the hospital encounter of 07/15/18 (from the past 48 hour(s))  Comprehensive metabolic panel     Status: Abnormal   Collection Time: 07/15/18  4:43 PM  Result Value Ref Range   Sodium 132 (L) 135 - 145 mmol/L   Potassium 5.0 3.5 - 5.1 mmol/L   Chloride 95 (L) 98 - 111 mmol/L   CO2 25 22 - 32 mmol/L   Glucose, Bld 339 (H) 70 - 99 mg/dL   BUN 43 (H) 6 - 20 mg/dL   Creatinine, Ser 7.86 (H) 0.61 - 1.24 mg/dL   Calcium 9.1 8.9 - 10.3 mg/dL   Total Protein 7.7 6.5 - 8.1 g/dL   Albumin 3.5 3.5 - 5.0 g/dL   AST 23 15 - 41 U/L   ALT 16  0 - 44 U/L   Alkaline Phosphatase 139 (H) 38 - 126 U/L   Total Bilirubin 0.5 0.3 - 1.2 mg/dL   GFR calc non Af Amer 7 (L) >60 mL/min   GFR calc Af Amer 8 (L) >60 mL/min    Comment: (NOTE) The eGFR has been calculated using the CKD EPI equation. This calculation has not been validated in all clinical situations. eGFR's persistently <60 mL/min signify possible Chronic Kidney Disease.    Anion gap 12 5 - 15    Comment: Performed at [facility], [place] [address] [place], [place] [address]  CBC with Differential     Status: Abnormal   Collection Time: 07/15/18  4:43 PM  Result Value Ref Range   WBC 6.9 4.0 - 10.5 K/uL   RBC 4.13 (L) 4.22 - 5.81 MIL/uL   Hemoglobin 12.3 (L) 13.0 - 17.0 g/dL   HCT 36.1 (L) 39.0 - 52.0 %   MCV 87.4 78.0 - 100.0 fL   MCH 29.8 26.0 - 34.0 pg   MCHC 34.1 30.0 - 36.0 g/dL   RDW 14.1 11.5 - 15.5 %   Platelets 262 150 - 400 K/uL   Neutrophils Relative % 70 %   Neutro Abs 4.8  1.7 - 7.7 K/uL   Lymphocytes Relative 21 %   Lymphs Abs 1.5 0.7 - 4.0 K/uL   Monocytes Relative 5 %   Monocytes Absolute 0.4 0.1 - 1.0 K/uL   Eosinophils Relative 4 %   Eosinophils Absolute 0.3 0.0 - 0.7 K/uL   Basophils Relative 0 %   Basophils Absolute 0.0 0.0 - 0.1 K/uL    Comment: Performed at [facility], [place] [address], [place], [place] [address]  Lipase, blood     Status: Abnormal   Collection Time: 07/15/18  4:43 PM  Result Value Ref Range   Lipase 54 (H) 11 - 51 U/L    Comment: Performed at [facility], [place] [address] [place], [place] [address]  I-Stat CG4 Lactic Acid, ED     Status: Abnormal   Collection Time: 07/15/18  5:01 PM  Result Value Ref Range   Lactic Acid, Venous 1.94 (H) 0.5 - 1.9 mmol/L  I-Stat CG4 Lactic Acid, ED     Status: None   Collection Time: 07/15/18  8:06 PM  Result Value Ref Range   Lactic Acid, Venous 1.63 0.5 - 1.9 mmol/L   Ct Abdomen Pelvis Wo Contrast  Result Date:  07/15/2018 CLINICAL DATA:  52 y/o M; right lower quadrant abdominal pain radiating to the left flank for 2 months. Reports mass to the right abdomen. EXAM: CT ABDOMEN AND PELVIS WITHOUT CONTRAST TECHNIQUE: Multidetector CT imaging of the abdomen and pelvis was performed following the standard protocol without IV contrast. COMPARISON:  12/16/2010 CT abdomen and pelvis. FINDINGS: Lower chest: Severe coronary artery calcific atherosclerosis and dense mitral annular calcification. Right central venous catheter tip projects over the right atrium. 2-3 mm nodules at the lung bases, some obscured by atelectasis on the prior CT. Hepatobiliary: No focal liver abnormality is seen. No gallstones, gallbladder wall thickening, or biliary dilatation. Pancreas: Unremarkable. No pancreatic ductal dilatation or surrounding inflammatory changes. Spleen: Normal in size without focal abnormality. Adrenals/Urinary Tract: Adrenal glands are unremarkable. Atrophic kidneys bilaterally. Kidneys are otherwise normal, without renal calculi, focal lesion, or hydronephrosis. Bladder is unremarkable. Stomach/Bowel: Stomach is within normal limits. Appendix appears normal. No evidence of bowel wall thickening, distention, or inflammatory changes. Vascular/Lymphatic: Aortic atherosclerosis. No enlarged abdominal or pelvic lymph nodes. Reproductive: Prostate is unremarkable. Other: No abdominal wall hernia or abnormality. No abdominopelvic ascites. Musculoskeletal: New L2 vertebral body subcentimeter lucent foci (series 4, image 90). No acute fracture. Increased density of bones. IMPRESSION: 1. No acute process identified. 2. 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 Differential includes lytic metastasis or myeloma. No direct connection to the disc space identified to suggest Schmorl's node. Follow-up recommended to ensure stability. 3. Severe coronary and aortic calcific  atherosclerosis. Electronically Signed   By: Kristine Garbe M.D.   On: 07/15/2018 17:46   Dg Chest 2 View  Result Date: 07/15/2018 CLINICAL DATA:  Sepsis.  Right lower quadrant pain. EXAM: CHEST - 2 VIEW COMPARISON:  12/16/2010 FINDINGS: There is a right jugular dialysis catheter with the tip extending into the right atrium. Low lung volumes. Left basilar densities are most compatible with atelectasis/scar and may be chronic. Cardiac silhouette appears to be within normal limits and stable but poorly characterized on this low lung volume examination. Negative for pneumothorax. No significant airspace disease or pulmonary edema. No large pleural effusions. IMPRESSION: Low lung volumes without acute findings. Presence of a dialysis catheter. Electronically Signed   By:  Markus Daft M.D.   On: 07/15/2018 17:07    Pending Labs Unresulted Labs (From admission, onward)   Start     Ordered   07/15/18 2053  Aerobic/Anaerobic Culture (surgical/deep wound)  Once,   R     07/15/18 2052   07/15/18 1616  Urinalysis, Routine w reflex microscopic  STAT,   STAT     07/15/18 1616   07/15/18 1614  Culture, blood (routine x 2)  BLOOD CULTURE X 2,   STAT     07/15/18 1614   Signed and Held  HIV antibody (Routine Testing)  Once,   R     Signed and Held      Vitals/Pain Today's Vitals   07/15/18 2200 07/15/18 2204 07/15/18 2210 07/15/18 2317  BP: (!) 160/83   (!) 154/87  Pulse: (!) 101   98  Resp:    20  Temp:      TempSrc:      SpO2: 92%   94%  Weight:  279 lb (126.6 kg)    Height:  6' 6"  (1.981 m)    PainSc:   5      Isolation Precautions No active isolations  Medications Medications  levofloxacin (LEVAQUIN) IVPB 750 mg ( Intravenous Stopped 07/15/18 1917)  aztreonam (AZACTAM) 2 g in sodium chloride 0.9 % 100 mL IVPB ( Intravenous Stopped 07/15/18 2019)  vancomycin (VANCOCIN) IVPB 1000 mg/200 mL premix ( Intravenous Stopped 07/15/18 2204)  fentaNYL (SUBLIMAZE) injection 50 mcg (50 mcg  Intravenous Given 07/15/18 1729)  lidocaine-EPINEPHrine (XYLOCAINE W/EPI) 2 %-1:200000 (PF) injection 10 mL (10 mLs Infiltration Given 07/15/18 1819)  fentaNYL (SUBLIMAZE) injection 50 mcg (50 mcg Intravenous Given 07/15/18 2101)    Mobility walks

## 2018-07-15 NOTE — ED Notes (Signed)
ED Provider at bedside. 

## 2018-07-15 NOTE — ED Notes (Signed)
Pt aware that a urine sample is needed.Pt given a urinal and ask to alert staff when he has a sample ready.

## 2018-07-16 ENCOUNTER — Other Ambulatory Visit: Payer: Self-pay

## 2018-07-16 ENCOUNTER — Encounter (HOSPITAL_COMMUNITY): Payer: Self-pay | Admitting: *Deleted

## 2018-07-16 LAB — BASIC METABOLIC PANEL
Anion gap: 14 (ref 5–15)
BUN: 50 mg/dL — ABNORMAL HIGH (ref 6–20)
CALCIUM: 9.5 mg/dL (ref 8.9–10.3)
CO2: 25 mmol/L (ref 22–32)
Chloride: 94 mmol/L — ABNORMAL LOW (ref 98–111)
Creatinine, Ser: 9.56 mg/dL — ABNORMAL HIGH (ref 0.61–1.24)
GFR, EST AFRICAN AMERICAN: 6 mL/min — AB (ref >60)
GFR, EST NON AFRICAN AMERICAN: 6 mL/min — AB (ref >60)
Glucose, Bld: 258 mg/dL — ABNORMAL HIGH (ref 70–99)
Potassium: 4.9 mmol/L (ref 3.5–5.1)
Sodium: 133 mmol/L — ABNORMAL LOW (ref 135–145)

## 2018-07-16 LAB — GLUCOSE, CAPILLARY
GLUCOSE-CAPILLARY: 443 mg/dL — AB (ref 70–99)
GLUCOSE-CAPILLARY: 273 mg/dL — AB (ref 70–99)
Glucose-Capillary: 170 mg/dL — ABNORMAL HIGH (ref 70–99)
Glucose-Capillary: 268 mg/dL — ABNORMAL HIGH (ref 70–99)
Glucose-Capillary: 276 mg/dL — ABNORMAL HIGH (ref 70–99)

## 2018-07-16 LAB — BLOOD CULTURE ID PANEL (REFLEXED)
ACINETOBACTER BAUMANNII: NOT DETECTED
CANDIDA PARAPSILOSIS: NOT DETECTED
Candida albicans: NOT DETECTED
Candida glabrata: NOT DETECTED
Candida krusei: NOT DETECTED
Candida tropicalis: NOT DETECTED
ENTEROCOCCUS SPECIES: NOT DETECTED
Enterobacter cloacae complex: NOT DETECTED
Enterobacteriaceae species: NOT DETECTED
Escherichia coli: NOT DETECTED
HAEMOPHILUS INFLUENZAE: NOT DETECTED
Klebsiella oxytoca: NOT DETECTED
Klebsiella pneumoniae: NOT DETECTED
LISTERIA MONOCYTOGENES: NOT DETECTED
METHICILLIN RESISTANCE: NOT DETECTED
Neisseria meningitidis: NOT DETECTED
PSEUDOMONAS AERUGINOSA: NOT DETECTED
Proteus species: NOT DETECTED
STAPHYLOCOCCUS AUREUS BCID: NOT DETECTED
STREPTOCOCCUS AGALACTIAE: NOT DETECTED
STREPTOCOCCUS PNEUMONIAE: NOT DETECTED
Serratia marcescens: NOT DETECTED
Staphylococcus species: DETECTED — AB
Streptococcus pyogenes: NOT DETECTED
Streptococcus species: NOT DETECTED

## 2018-07-16 LAB — MRSA PCR SCREENING: MRSA BY PCR: NEGATIVE

## 2018-07-16 LAB — PHOSPHORUS: Phosphorus: 8 mg/dL — ABNORMAL HIGH (ref 2.5–4.6)

## 2018-07-16 LAB — HIV ANTIBODY (ROUTINE TESTING W REFLEX): HIV Screen 4th Generation wRfx: NONREACTIVE

## 2018-07-16 MED ORDER — VANCOMYCIN HCL IN DEXTROSE 1-5 GM/200ML-% IV SOLN
1000.0000 mg | Freq: Once | INTRAVENOUS | Status: AC
  Administered 2018-07-16: 1000 mg via INTRAVENOUS
  Filled 2018-07-16 (×2): qty 200

## 2018-07-16 MED ORDER — VANCOMYCIN HCL IN DEXTROSE 1-5 GM/200ML-% IV SOLN
1000.0000 mg | INTRAVENOUS | Status: DC
  Administered 2018-07-17: 1000 mg via INTRAVENOUS
  Filled 2018-07-16: qty 200

## 2018-07-16 MED ORDER — HYDRALAZINE HCL 20 MG/ML IJ SOLN
10.0000 mg | Freq: Four times a day (QID) | INTRAMUSCULAR | Status: DC | PRN
  Administered 2018-07-16: 10 mg via INTRAVENOUS
  Filled 2018-07-16: qty 1

## 2018-07-16 MED ORDER — CHLORHEXIDINE GLUCONATE CLOTH 2 % EX PADS
6.0000 | MEDICATED_PAD | Freq: Every day | CUTANEOUS | Status: DC
  Administered 2018-07-16: 6 via TOPICAL

## 2018-07-16 MED ORDER — ACETAMINOPHEN 650 MG RE SUPP
650.0000 mg | Freq: Four times a day (QID) | RECTAL | Status: DC | PRN
Start: 2018-07-16 — End: 2018-07-23

## 2018-07-16 MED ORDER — SEVELAMER CARBONATE 800 MG PO TABS
1600.0000 mg | ORAL_TABLET | Freq: Three times a day (TID) | ORAL | Status: DC
Start: 2018-07-16 — End: 2018-07-20
  Administered 2018-07-16 – 2018-07-20 (×9): 1600 mg via ORAL
  Filled 2018-07-16 (×9): qty 2

## 2018-07-16 MED ORDER — HEPARIN SODIUM (PORCINE) 5000 UNIT/ML IJ SOLN
5000.0000 [IU] | Freq: Three times a day (TID) | INTRAMUSCULAR | Status: DC
  Administered 2018-07-16 – 2018-07-17 (×4): 5000 [IU] via SUBCUTANEOUS
  Filled 2018-07-16 (×4): qty 1

## 2018-07-16 MED ORDER — ONDANSETRON HCL 4 MG PO TABS
4.0000 mg | ORAL_TABLET | Freq: Four times a day (QID) | ORAL | Status: DC | PRN
  Administered 2018-07-21: 4 mg via ORAL
  Filled 2018-07-16: qty 1

## 2018-07-16 MED ORDER — INSULIN ASPART 100 UNIT/ML ~~LOC~~ SOLN
12.0000 [IU] | Freq: Once | SUBCUTANEOUS | Status: AC
Start: 2018-07-16 — End: 2018-07-16
  Administered 2018-07-16: 12 [IU] via SUBCUTANEOUS

## 2018-07-16 MED ORDER — ONDANSETRON HCL 4 MG/2ML IJ SOLN
4.0000 mg | Freq: Four times a day (QID) | INTRAMUSCULAR | Status: DC | PRN
  Administered 2018-07-16 – 2018-07-23 (×4): 4 mg via INTRAVENOUS
  Filled 2018-07-16 (×5): qty 2

## 2018-07-16 MED ORDER — INSULIN ASPART 100 UNIT/ML ~~LOC~~ SOLN
0.0000 [IU] | Freq: Three times a day (TID) | SUBCUTANEOUS | Status: DC
Start: 2018-07-16 — End: 2018-07-23
  Administered 2018-07-16 (×2): 8 [IU] via SUBCUTANEOUS
  Administered 2018-07-16 – 2018-07-17 (×2): 3 [IU] via SUBCUTANEOUS
  Administered 2018-07-17: 5 [IU] via SUBCUTANEOUS
  Administered 2018-07-18: 11 [IU] via SUBCUTANEOUS
  Administered 2018-07-18: 8 [IU] via SUBCUTANEOUS
  Administered 2018-07-18: 11 [IU] via SUBCUTANEOUS
  Administered 2018-07-19: 8 [IU] via SUBCUTANEOUS
  Administered 2018-07-20: 11 [IU] via SUBCUTANEOUS
  Administered 2018-07-20: 8 [IU] via SUBCUTANEOUS
  Administered 2018-07-20: 3 [IU] via SUBCUTANEOUS
  Administered 2018-07-21: 11 [IU] via SUBCUTANEOUS
  Administered 2018-07-21: 8 [IU] via SUBCUTANEOUS
  Administered 2018-07-22: 5 [IU] via SUBCUTANEOUS
  Administered 2018-07-22: 15 [IU] via SUBCUTANEOUS
  Administered 2018-07-23: 8 [IU] via SUBCUTANEOUS
  Administered 2018-07-23: 5 [IU] via SUBCUTANEOUS

## 2018-07-16 MED ORDER — ACETAMINOPHEN 325 MG PO TABS: 650.0000 mg | ORAL_TABLET | Freq: Four times a day (QID) | ORAL | Status: DC | PRN

## 2018-07-16 MED ORDER — INSULIN ASPART 100 UNIT/ML ~~LOC~~ SOLN
4.0000 [IU] | Freq: Once | SUBCUTANEOUS | Status: AC
Start: 2018-07-16 — End: 2018-07-16
  Administered 2018-07-16: 4 [IU] via SUBCUTANEOUS

## 2018-07-16 MED ORDER — AMLODIPINE BESYLATE 5 MG PO TABS
5.0000 mg | ORAL_TABLET | Freq: Every day | ORAL | Status: DC
  Administered 2018-07-16 – 2018-07-23 (×8): 5 mg via ORAL
  Filled 2018-07-16 (×9): qty 1

## 2018-07-16 MED ORDER — HYDROCODONE-ACETAMINOPHEN 5-325 MG PO TABS
1.0000 | ORAL_TABLET | ORAL | Status: DC | PRN
  Administered 2018-07-16 – 2018-07-23 (×9): 2 via ORAL
  Filled 2018-07-16 (×10): qty 2

## 2018-07-16 MED ORDER — POLYETHYLENE GLYCOL 3350 17 G PO PACK
17.0000 g | PACK | Freq: Every day | ORAL | Status: DC
  Administered 2018-07-19 – 2018-07-23 (×3): 17 g via ORAL
  Filled 2018-07-16 (×6): qty 1

## 2018-07-16 MED ORDER — FENTANYL CITRATE (PF) 100 MCG/2ML IJ SOLN
50.0000 ug | INTRAMUSCULAR | Status: DC | PRN
  Administered 2018-07-16 – 2018-07-22 (×2): 50 ug via INTRAVENOUS
  Filled 2018-07-16 (×2): qty 2

## 2018-07-16 NOTE — Progress Notes (Signed)
Attempted to call RN back after hanging IV bolus (as explained to RN when she called to give report). Unable to get through to [facility] ED. Myself and other RN and information @[facility]  attempted. Finally got thru and report obtained.

## 2018-07-16 NOTE — Progress Notes (Signed)
Received pt via [facility] via stretcher. See assessment.

## 2018-07-16 NOTE — Progress Notes (Signed)
Pt needs binders takes 2 Renvela before meals and snacks. Paged FYI Dr. Sloan Leiter

## 2018-07-16 NOTE — Progress Notes (Signed)
PROGRESS NOTE        PATIENT DETAILS Name: David Walsh Age: 52 y.o. Sex: male Date of Birth: 1965-12-24 Admit Date: 07/15/2018 Admitting Physician Ejiroghene Arlyce Dice, MD [facility]  Brief Narrative: Patient is a 52 y.o. male with history of ESRD on HD TTS, right lower extremity BKA, DM, hypertension, chronic abdominal pain (ongoing for the past 2 years) presented to the ED with abdominal pain and a upper back abscess.  Patient underwent I&D-started on empiric antibiotics and is admitted to the hospitalist service.  Subjective: Complains of bilateral intermittent abdominal pain that has been ongoing for the past 2 years.  No nausea vomiting today.  Claims that he had a abscess in his back for which he is required several rounds of antibiotics in the outpatient setting.  Assessment/Plan: Small upper back abscess: Underwent I&D in the emergency room-continue with vancomycin.  Unable to express any purulent material on squeezing wound this morning.  Gram-positive bacteremia: 1 out of 2 blood cultures positive for gram-positive cocci in clusters-patient does have a HD catheter in place.  Continue vancomycin-await final culture results.  Abdominal pain: Abdominal exam is benign-CT scan of the abdomen does not show any acute abnormalities.  I suspect further work-up can be pursued in the outpatient setting.  ESRD: Undergoes HD at the [facility] center TTS-we will consult nephrology.  He completed his hemodialysis on 7/30  DM-2: Claims he is on a sliding scale at home-med rec still not completed-has spoken with pharmacy, continue with SSI for now.  Hypertension: Awaiting med rec to be completed by pharmacy-blood pressure elevated-start amlodipine and follow.  Hx of BKA, right   DVT Prophylaxis: Prophylactic Heparin   Code Status: Full code   Family Communication: None at bedside  Disposition Plan: Remain inpatient-will require a few more days of  hospitalization before consideration of discharge.  Antimicrobial agents: Anti-infectives (From admission, onward)   Start     Dose/Rate Route Frequency Ordered Stop   07/17/18 1200  vancomycin (VANCOCIN) IVPB 1000 mg/200 mL premix     1,000 mg 200 mL/hr over 60 Minutes Intravenous Every T-Th-Sa (Hemodialysis) 07/16/18 0129     07/16/18 0015  vancomycin (VANCOCIN) IVPB 1000 mg/200 mL premix     1,000 mg 200 mL/hr over 60 Minutes Intravenous  Once 07/16/18 0006 07/16/18 0609   07/15/18 2345  cefTRIAXone (ROCEPHIN) 2 g in sodium chloride 0.9 % 100 mL IVPB  Status:  Discontinued     2 g 200 mL/hr over 30 Minutes Intravenous Daily at bedtime 07/15/18 2329 07/16/18 1552   07/15/18 1630  levofloxacin (LEVAQUIN) IVPB 750 mg     750 mg 100 mL/hr over 90 Minutes Intravenous  Once 07/15/18 1616 07/15/18 1917   07/15/18 1630  aztreonam (AZACTAM) 2 g in sodium chloride 0.9 % 100 mL IVPB     2 g 200 mL/hr over 30 Minutes Intravenous  Once 07/15/18 1616 07/15/18 2019   07/15/18 1630  vancomycin (VANCOCIN) IVPB 1000 mg/200 mL premix     1,000 mg 200 mL/hr over 60 Minutes Intravenous  Once 07/15/18 1616 07/15/18 2204      Procedures: None  CONSULTS:  None  Time spent: 25- minutes-Greater than 50% of this time was spent in counseling, explanation of diagnosis, planning of further management, and coordination of care.  MEDICATIONS: Scheduled Meds: . heparin  5,000 Units Subcutaneous  Q8H  . insulin aspart  0-15 Units Subcutaneous TID WC  . polyethylene glycol  17 g Oral Daily  . sevelamer carbonate  1,600 mg Oral TID WC   Continuous Infusions: . [START ON 07/17/2018] vancomycin     PRN Meds:.acetaminophen **OR** acetaminophen, fentaNYL (SUBLIMAZE) injection, HYDROcodone-acetaminophen, ondansetron **OR** ondansetron (ZOFRAN) IV   PHYSICAL EXAM: Vital signs: Vitals:   07/15/18 2317 07/16/18 0115 07/16/18 0359 07/16/18 0854  BP: (!) 154/87 (!) 204/107 (!) 179/92 (!) 174/98  Pulse: 98 96  92 84  Resp: 20 18 18 18   Temp:   97.8 F (36.6 C) 97.8 F (36.6 C)  TempSrc:   Oral Oral  SpO2: 94% 97% 98% 95%  Weight:      Height:       Filed Weights   07/15/18 1648 07/15/18 2204  Weight: 126.6 kg (279 lb) 126.6 kg (279 lb)   Body mass index is 32.24 kg/m.   General appearance :Awake, alert, not in any distress. Eyes:Pink conjunctiva HEENT: Atraumatic and Normocephalic Neck: supple Resp:Good air entry bilaterally, no added sounds  CVS: S1 S2 regular, no murmurs.  GI: Bowel sounds present, Non tender and not distended with no gaurding, rigidity or rebound. Extremities: Right BKA Neurology:  speech clear,Non focal, sensation is grossly intact. Psychiatric: Normal judgment and insight. Alert and oriented x 3. Normal mood. Musculoskeletal:No digital cyanosis Skin:No Rash, warm and dry Wounds:N/A  I have personally reviewed following labs and imaging studies  LABORATORY DATA: CBC: Recent Labs  Lab 07/15/18 1643  WBC 6.9  NEUTROABS 4.8  HGB 12.3*  HCT 36.1*  MCV 87.4  PLT 448    Basic Metabolic Panel: Recent Labs  Lab 07/15/18 1643 07/16/18 0920  NA 132* 133*  K 5.0 4.9  CL 95* 94*  CO2 25 25  GLUCOSE 339* 258*  BUN 43* 50*  CREATININE 7.86* 9.56*  CALCIUM 9.1 9.5    GFR: Estimated Creatinine Clearance: 13.5 mL/min (A) (by C-G formula based on SCr of 9.56 mg/dL (H)).  Liver Function Tests: Recent Labs  Lab 07/15/18 1643  AST 23  ALT 16  ALKPHOS 139*  BILITOT 0.5  PROT 7.7  ALBUMIN 3.5   Recent Labs  Lab 07/15/18 1643  LIPASE 54*   No results for input(s): AMMONIA in the last 168 hours.  Coagulation Profile: No results for input(s): INR, PROTIME in the last 168 hours.  Cardiac Enzymes: No results for input(s): CKTOTAL, CKMB, CKMBINDEX, TROPONINI in the last 168 hours.  BNP (last 3 results) No results for input(s): PROBNP in the last 8760 hours.  HbA1C: No results for input(s): HGBA1C in the last 72 hours.  CBG: Recent Labs    Lab 07/16/18 0112 07/16/18 0739 07/16/18 1200  GLUCAP 443* 273* 170*    Lipid Profile: No results for input(s): CHOL, HDL, LDLCALC, TRIG, CHOLHDL, LDLDIRECT in the last 72 hours.  Thyroid Function Tests: No results for input(s): TSH, T4TOTAL, FREET4, T3FREE, THYROIDAB in the last 72 hours.  Anemia Panel: No results for input(s): VITAMINB12, FOLATE, FERRITIN, TIBC, IRON, RETICCTPCT in the last 72 hours.  Urine analysis:    Component Value Date/Time   COLORURINE YELLOW 12/15/2010 2309   APPEARANCEUR CLEAR 12/15/2010 2309   LABSPEC 1.021 12/15/2010 2309   PHURINE 6.5 12/15/2010 2309   GLUCOSEU 500 (A) 12/15/2010 2309   HGBUR LARGE (A) 12/15/2010 2309   BILIRUBINUR NEGATIVE 12/15/2010 2309   KETONESUR NEGATIVE 12/15/2010 2309   PROTEINUR >300 (A) 12/15/2010 2309   UROBILINOGEN 0.2 12/15/2010 2309  NITRITE NEGATIVE 12/15/2010 2309   LEUKOCYTESUR NEGATIVE 12/15/2010 2309    Sepsis Labs: Lactic Acid, Venous    Component Value Date/Time   LATICACIDVEN 1.63 07/15/2018 2006    MICROBIOLOGY: Recent Results (from the past 240 hour(s))  Culture, blood (routine x 2)     Status: None (Preliminary result)   Collection Time: 07/15/18  4:43 PM  Result Value Ref Range Status   Specimen Description   Final    BLOOD BLOOD LEFT FOREARM Performed at [facility], [place] [address], [place], [place] [address]    Special Requests   Final    BOTTLES DRAWN AEROBIC AND ANAEROBIC Blood Culture adequate volume Performed at [facility] [address] [place], [place] [address]    Culture   Final    NO GROWTH < 24 HOURS Performed at [facility] [address], [place], [place] [address]    Report Status PENDING  Incomplete  Culture, blood (routine x 2)     Status: None (Preliminary result)   Collection Time: 07/15/18  4:47 PM  Result Value Ref Range Status   Specimen Description   Final    BLOOD LEFT ANTECUBITAL Performed at [facility] [address], [place], [place] [address]    Special Requests   Final    BOTTLES DRAWN AEROBIC AND ANAEROBIC Blood Culture adequate volume Performed at [facility] [address], [place], [place] [address]    Culture  Setup Time   Final    GRAM POSITIVE COCCI IN CLUSTERS ANAEROBIC BOTTLE ONLY Organism ID to follow CRITICAL RESULT CALLED TO, READ BACK BY AND VERIFIED WITH: Jene Every PharmD 15:15 07/16/18 ([email or web address])    Culture   Final    NO GROWTH < 24 HOURS Performed at [facility], [address], [place],  [address]    Report Status PENDING  Incomplete  Blood Culture ID Panel (Reflexed)     Status: Abnormal   Collection Time: 07/15/18  4:47 PM  Result Value Ref Range Status   Enterococcus species NOT DETECTED NOT DETECTED Final   Listeria monocytogenes NOT DETECTED NOT DETECTED Final   Staphylococcus species DETECTED (A) NOT DETECTED Final    Comment: Methicillin (oxacillin) susceptible coagulase negative staphylococcus. Possible blood culture contaminant (unless isolated from more than one blood culture draw or clinical case suggests pathogenicity). No antibiotic treatment is indicated for blood  culture contaminants. CRITICAL RESULT CALLED TO, READ BACK BY AND VERIFIED WITH: Jene Every PharmD 15:15 07/16/18 ([email or web address])    Staphylococcus aureus NOT DETECTED NOT DETECTED Final   Methicillin resistance NOT DETECTED NOT DETECTED Final   Streptococcus species NOT DETECTED NOT DETECTED Final   Streptococcus agalactiae NOT DETECTED NOT DETECTED Final   Streptococcus pneumoniae NOT DETECTED NOT DETECTED Final   Streptococcus pyogenes NOT DETECTED NOT DETECTED Final   Acinetobacter baumannii NOT DETECTED NOT DETECTED Final   Enterobacteriaceae species NOT DETECTED NOT DETECTED Final   Enterobacter cloacae complex NOT DETECTED NOT DETECTED Final   Escherichia coli NOT DETECTED NOT DETECTED Final   Klebsiella oxytoca NOT DETECTED NOT  DETECTED Final   Klebsiella pneumoniae NOT DETECTED NOT DETECTED Final   Proteus species NOT DETECTED NOT DETECTED Final   Serratia marcescens NOT DETECTED NOT DETECTED Final   Haemophilus influenzae NOT DETECTED NOT DETECTED Final   Neisseria meningitidis NOT DETECTED NOT DETECTED Final   Pseudomonas aeruginosa NOT DETECTED NOT DETECTED Final   Candida albicans NOT DETECTED NOT  DETECTED Final   Candida glabrata NOT DETECTED NOT DETECTED Final   Candida krusei NOT DETECTED NOT DETECTED Final   Candida parapsilosis NOT DETECTED NOT DETECTED Final   Candida tropicalis NOT DETECTED NOT DETECTED Final    Comment: Performed at [facility], [place] [address] [place], [place] [address]  Aerobic/Anaerobic Culture (surgical/deep wound)     Status: None (Preliminary result)   Collection Time: 07/15/18  8:53 PM  Result Value Ref Range Status   Specimen Description   Final    WOUND BACK Performed at [facility] [address], [place], [place] [address]    Special Requests   Final    NONE Performed at [facility], [place] [address] [place], [place] [address]    Gram Stain   Final    RARE WBC PRESENT, PREDOMINANTLY PMN RARE GRAM POSITIVE COCCI Performed at [facility], [place] [address] [place], [place] [address]    Culture PENDING  Incomplete   Report Status PENDING  Incomplete  MRSA PCR Screening     Status: None   Collection Time: 07/16/18  3:20 AM  Result Value Ref Range Status   MRSA by PCR NEGATIVE NEGATIVE Final    Comment:        The GeneXpert MRSA Assay (FDA approved for NASAL specimens only), is one component of a comprehensive MRSA colonization surveillance program. It is not intended to diagnose MRSA infection nor to guide or monitor treatment for MRSA infections. Performed at [facility], [place] [address], [place], [place] [address]     RADIOLOGY STUDIES/RESULTS: Ct Abdomen Pelvis Wo Contrast  Result Date:  07/15/2018 CLINICAL DATA:  52 y/o M; right lower quadrant abdominal pain radiating to the left flank for 2 months. Reports mass to the right abdomen. EXAM: CT ABDOMEN AND PELVIS WITHOUT CONTRAST TECHNIQUE: Multidetector CT imaging of the abdomen and pelvis was performed following the standard protocol without IV contrast. COMPARISON:  12/16/2010 CT abdomen and pelvis. FINDINGS: Lower chest: Severe coronary artery calcific atherosclerosis and dense mitral annular calcification. Right central venous catheter tip projects over the right atrium. 2-3 mm nodules at the lung bases, some obscured by atelectasis on the prior CT. Hepatobiliary: No focal liver abnormality is seen. No gallstones, gallbladder wall thickening, or biliary dilatation. Pancreas: Unremarkable. No pancreatic ductal dilatation or surrounding inflammatory changes. Spleen: Normal in size without focal abnormality. Adrenals/Urinary Tract: Adrenal glands are unremarkable. Atrophic kidneys bilaterally. Kidneys are otherwise normal, without renal calculi, focal lesion, or hydronephrosis. Bladder is unremarkable. Stomach/Bowel: Stomach is within normal limits. Appendix appears normal. No evidence of bowel wall thickening, distention, or inflammatory changes. Vascular/Lymphatic: Aortic atherosclerosis. No enlarged abdominal or pelvic lymph nodes. Reproductive: Prostate is unremarkable. Other: No abdominal wall hernia or abnormality. No abdominopelvic ascites. Musculoskeletal: New L2 vertebral body subcentimeter lucent foci (series 4, image 90). No acute fracture. Increased density of bones. IMPRESSION: 1. No acute process identified. 2. 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 Differential includes lytic metastasis or myeloma. No direct connection to the disc space identified to suggest Schmorl's node. Follow-up recommended to ensure stability. 3. Severe coronary and aortic calcific  atherosclerosis. Electronically Signed   By: Kristine Garbe M.D.   On: 07/15/2018 17:46   Dg Chest 2 View  Result Date: 07/15/2018 CLINICAL DATA:  Sepsis.  Right lower quadrant pain. EXAM: CHEST - 2 VIEW COMPARISON:  12/16/2010 FINDINGS: There is a right  jugular dialysis catheter with the tip extending into the right atrium. Low lung volumes. Left basilar densities are most compatible with atelectasis/scar and may be chronic. Cardiac silhouette appears to be within normal limits and stable but poorly characterized on this low lung volume examination. Negative for pneumothorax. No significant airspace disease or pulmonary edema. No large pleural effusions. IMPRESSION: Low lung volumes without acute findings. Presence of a dialysis catheter. Electronically Signed   By: Markus Daft M.D.   On: 07/15/2018 17:07     LOS: 1 day   Oren Binet, MD  [facility]  If 7PM-7AM, please contact night-coverage  Please page via [email or web address]-click on MD name and type text message  07/16/2018, 3:56 PM

## 2018-07-16 NOTE — Progress Notes (Signed)
PHARMACY - PHYSICIAN COMMUNICATION CRITICAL VALUE ALERT - BLOOD CULTURE IDENTIFICATION (BCID)  David Walsh is an 52 y.o. male who presented to [facility] on 07/15/2018 with a chief complaint of back abscess.    Assessment:  Pt has been treated with multiple course of abx for this at several sites. I&D was done here. He was placed on empiric abx of vanc/ceftriaxone.   Name of physician (or Provider) Contacted: Dr. Sloan Leiter  Current antibiotics: Vanc/ceftriaxone  Changes to prescribed antibiotics recommended:  Continue vanc. Dc ceftriaxone  Results for orders placed or performed during the hospital encounter of 07/15/18  Blood Culture ID Panel (Reflexed) (Collected: 07/15/2018  4:47 PM)  Result Value Ref Range   Enterococcus species NOT DETECTED NOT DETECTED   Listeria monocytogenes NOT DETECTED NOT DETECTED   Staphylococcus species DETECTED (A) NOT DETECTED   Staphylococcus aureus NOT DETECTED NOT DETECTED   Methicillin resistance NOT DETECTED NOT DETECTED   Streptococcus species NOT DETECTED NOT DETECTED   Streptococcus agalactiae NOT DETECTED NOT DETECTED   Streptococcus pneumoniae NOT DETECTED NOT DETECTED   Streptococcus pyogenes NOT DETECTED NOT DETECTED   Acinetobacter baumannii NOT DETECTED NOT DETECTED   Enterobacteriaceae species NOT DETECTED NOT DETECTED   Enterobacter cloacae complex NOT DETECTED NOT DETECTED   Escherichia coli NOT DETECTED NOT DETECTED   Klebsiella oxytoca NOT DETECTED NOT DETECTED   Klebsiella pneumoniae NOT DETECTED NOT DETECTED   Proteus species NOT DETECTED NOT DETECTED   Serratia marcescens NOT DETECTED NOT DETECTED   Haemophilus influenzae NOT DETECTED NOT DETECTED   Neisseria meningitidis NOT DETECTED NOT DETECTED   Pseudomonas aeruginosa NOT DETECTED NOT DETECTED   Candida albicans NOT DETECTED NOT DETECTED   Candida glabrata NOT DETECTED NOT DETECTED   Candida krusei NOT DETECTED NOT DETECTED   Candida parapsilosis NOT DETECTED NOT DETECTED    Candida tropicalis NOT DETECTED NOT DETECTED   Onnie Boer, PharmD, BCIDP, AAHIVP, CPP Infectious Disease Pharmacist Pager: [phone] 07/16/2018 4:00 PM

## 2018-07-16 NOTE — Progress Notes (Addendum)
Inpatient Diabetes Program Recommendations  AACE/ADA: New Consensus Statement on Inpatient Glycemic Control (2015)  Target Ranges:  Prepandial:   less than 140 mg/dL      Peak postprandial:   less than 180 mg/dL (1-2 hours)      Critically ill patients:  140 - 180 mg/dL   Review of Glycemic Control  Diabetes history: DM 2 Outpatient Diabetes medications: SSI at home Current orders for Inpatient glycemic control: Novolog 0-15 units tid  Inpatient Diabetes Program Recommendations:    Consider an updated A1c level to determine glucose control over the past 2-3 months.   Addendum 1130 am: Spoke with patient at bedside. Patient reports he takes Lantus 5 units Daily and Novolog 0-6 units tid at home. Patient reports having an A1c of 11% 2 months ago when he went for a check up. Patient report being enrolled and going to a DM class next week due to uncontrolled levels. Asked patient about his elevated glucose on admission. Patient reports not getting his insulin for 24 hours at that point. Patient reports changing his lifestyle and hiring a Facilities manager for his renal diet. Patient does not have needs at this time. Patient had questions about bariatric surgery for DM management.  Thanks,  Tama Headings RN, MSN, BC-ADM Inpatient Diabetes Coordinator Team Pager [phone] (8a-5p)

## 2018-07-16 NOTE — ED Notes (Signed)
[facility] contacted for transfer.

## 2018-07-16 NOTE — Consult Note (Signed)
Reason for Consult: To manage dialysis and dialysis related needs Referring Physician: Dr. Garrel Ridgel Damaso is an 52 y.o. male.  HPI:  52yo AAM with ESRD on HD, DM, HTN who presented for evaluation of abdominal pain as well as an abscess on his back.   His abdominal pain is in the RLQ at the site of a PD catheter that was removed 4 years ago.  It's been a chronic issue and has been evaluated at [place], [place], [place] without clear etiology.  The CT scan here doesn't show any etiology.   He also had an abscess on his back I&Dd and he was admitted while cultures were pending.  Rec'd IV vanc, aztreonam, levaquin due to +SIRS criteria on presentation.  Plan was to discharge today but 1 of 2 blood cultures returned +, speciation pending.  He denies fevers, chills at admission.   Current HD catheter has been in for 4(!) years.  No h/o bacteremia.   Dialyzes at [facility] --> will obtain records, says he runs 4.5 hours  Past Medical History:  Diagnosis Date  . Anxiety   . Arthritis   . Diabetes mellitus without complication ([facility])   . Hx of BKA ([facility])   . Neuropathy   . Renal disorder     History reviewed. No pertinent surgical history.  Family History  Problem Relation Age of Onset  . Arthritis Mother   . Heart attack Father   . Hypertension Sister     Social History:  reports that he has been smoking cigars.  He does not have any smokeless tobacco history on file. He reports that he drank alcohol. He reports that he has current or past drug history.  Allergies:  Allergies  Allergen Reactions  . Shellfish Allergy Anaphylaxis  . Penicillins Itching and Rash    Has patient had a PCN reaction causing immediate rash, facial/tongue/throat swelling, SOB or lightheadedness with hypotension: Yes Has patient had a PCN reaction causing severe rash involving mucus membranes or skin necrosis: No Has patient had a PCN reaction that required hospitalization: No Has patient had a PCN reaction  occurring within the last 10 years: No If all of the above answers are "NO", then may proceed with Cephalosporin use.     Medications: I have reviewed the patient's current medications. Hectorol, ESA, venofer doses - will obtain from outpt unit tomorrow.   Results for orders placed or performed during the hospital encounter of 07/15/18 (from the past 48 hour(s))  Comprehensive metabolic panel     Status: Abnormal   Collection Time: 07/15/18  4:43 PM  Result Value Ref Range   Sodium 132 (L) 135 - 145 mmol/L   Potassium 5.0 3.5 - 5.1 mmol/L   Chloride 95 (L) 98 - 111 mmol/L   CO2 25 22 - 32 mmol/L   Glucose, Bld 339 (H) 70 - 99 mg/dL   BUN 43 (H) 6 - 20 mg/dL   Creatinine, Ser 7.86 (H) 0.61 - 1.24 mg/dL   Calcium 9.1 8.9 - 10.3 mg/dL   Total Protein 7.7 6.5 - 8.1 g/dL   Albumin 3.5 3.5 - 5.0 g/dL   AST 23 15 - 41 U/L   ALT 16 0 - 44 U/L   Alkaline Phosphatase 139 (H) 38 - 126 U/L   Total Bilirubin 0.5 0.3 - 1.2 mg/dL   GFR calc non Af Amer 7 (L) >60 mL/min   GFR calc Af Amer 8 (L) >60 mL/min    Comment: (NOTE) The  eGFR has been calculated using the CKD EPI equation. This calculation has not been validated in all clinical situations. eGFR's persistently <60 mL/min signify possible Chronic Kidney Disease.    Anion gap 12 5 - 15    Comment: Performed at [facility], [place] [address] [place], [place] [address]  CBC with Differential     Status: Abnormal   Collection Time: 07/15/18  4:43 PM  Result Value Ref Range   WBC 6.9 4.0 - 10.5 K/uL   RBC 4.13 (L) 4.22 - 5.81 MIL/uL   Hemoglobin 12.3 (L) 13.0 - 17.0 g/dL   HCT 36.1 (L) 39.0 - 52.0 %   MCV 87.4 78.0 - 100.0 fL   MCH 29.8 26.0 - 34.0 pg   MCHC 34.1 30.0 - 36.0 g/dL   RDW 14.1 11.5 - 15.5 %   Platelets 262 150 - 400 K/uL   Neutrophils Relative % 70 %   Neutro Abs 4.8 1.7 - 7.7 K/uL   Lymphocytes Relative 21 %   Lymphs Abs 1.5 0.7 - 4.0 K/uL   Monocytes Relative 5 %   Monocytes Absolute 0.4 0.1 -  1.0 K/uL   Eosinophils Relative 4 %   Eosinophils Absolute 0.3 0.0 - 0.7 K/uL   Basophils Relative 0 %   Basophils Absolute 0.0 0.0 - 0.1 K/uL    Comment: Performed at [facility], [place] [address], [place], [place] [address]  Lipase, blood     Status: Abnormal   Collection Time: 07/15/18  4:43 PM  Result Value Ref Range   Lipase 54 (H) 11 - 51 U/L    Comment: Performed at [facility], [place] [address] [place], [place] [address]  Culture, blood (routine x 2)     Status: None (Preliminary result)   Collection Time: 07/15/18  4:43 PM  Result Value Ref Range   Specimen Description      BLOOD BLOOD LEFT FOREARM Performed at [facility] [address], [place], [place] [address]    Special Requests      BOTTLES DRAWN AEROBIC AND ANAEROBIC Blood Culture adequate volume Performed at [facility] [address], [place], [place] [address]    Culture      NO GROWTH < 24 HOURS Performed at [facility] [address] [place], [place] [address]    Report Status PENDING   Culture, blood (routine x 2)     Status: None (Preliminary result)   Collection Time: 07/15/18  4:47 PM  Result Value Ref Range   Specimen Description      BLOOD LEFT ANTECUBITAL Performed at [facility] [address], [place], [place] [address]    Special Requests      BOTTLES DRAWN AEROBIC AND ANAEROBIC Blood Culture adequate volume Performed at [facility] [address], [place], [place] [address]    Culture  Setup Time      GRAM POSITIVE COCCI IN CLUSTERS ANAEROBIC BOTTLE ONLY Organism ID to follow CRITICAL RESULT CALLED TO, READ BACK BY AND VERIFIED WITH: Jene Every PharmD 15:15 07/16/18 ([email or web address])    Culture      NO GROWTH < 24 HOURS Performed at [facility], [address], [place], [place] [address]    Report Status PENDING   Blood Culture ID Panel (Reflexed)     Status:  Abnormal   Collection Time: 07/15/18  4:47 PM  Result Value Ref Range   Enterococcus  species NOT DETECTED NOT DETECTED   Listeria monocytogenes NOT DETECTED NOT DETECTED   Staphylococcus species DETECTED (A) NOT DETECTED    Comment: Methicillin (oxacillin) susceptible coagulase negative staphylococcus. Possible blood culture contaminant (unless isolated from more than one blood culture draw or clinical case suggests pathogenicity). No antibiotic treatment is indicated for blood  culture contaminants. CRITICAL RESULT CALLED TO, READ BACK BY AND VERIFIED WITH: Jene Every PharmD 15:15 07/16/18 ([email or web address])    Staphylococcus aureus NOT DETECTED NOT DETECTED   Methicillin resistance NOT DETECTED NOT DETECTED   Streptococcus species NOT DETECTED NOT DETECTED   Streptococcus agalactiae NOT DETECTED NOT DETECTED   Streptococcus pneumoniae NOT DETECTED NOT DETECTED   Streptococcus pyogenes NOT DETECTED NOT DETECTED   Acinetobacter baumannii NOT DETECTED NOT DETECTED   Enterobacteriaceae species NOT DETECTED NOT DETECTED   Enterobacter cloacae complex NOT DETECTED NOT DETECTED   Escherichia coli NOT DETECTED NOT DETECTED   Klebsiella oxytoca NOT DETECTED NOT DETECTED   Klebsiella pneumoniae NOT DETECTED NOT DETECTED   Proteus species NOT DETECTED NOT DETECTED   Serratia marcescens NOT DETECTED NOT DETECTED   Haemophilus influenzae NOT DETECTED NOT DETECTED   Neisseria meningitidis NOT DETECTED NOT DETECTED   Pseudomonas aeruginosa NOT DETECTED NOT DETECTED   Candida albicans NOT DETECTED NOT DETECTED   Candida glabrata NOT DETECTED NOT DETECTED   Candida krusei NOT DETECTED NOT DETECTED   Candida parapsilosis NOT DETECTED NOT DETECTED   Candida tropicalis NOT DETECTED NOT DETECTED    Comment: Performed at [facility] [address], [place], [place] [address]  I-Stat CG4 Lactic Acid, ED     Status: Abnormal   Collection Time: 07/15/18  5:01 PM  Result Value Ref Range   Lactic Acid, Venous  1.94 (H) 0.5 - 1.9 mmol/L  I-Stat CG4 Lactic Acid, ED     Status: None   Collection Time: 07/15/18  8:06 PM  Result Value Ref Range   Lactic Acid, Venous 1.63 0.5 - 1.9 mmol/L  Aerobic/Anaerobic Culture (surgical/deep wound)     Status: None (Preliminary result)   Collection Time: 07/15/18  8:53 PM  Result Value Ref Range   Specimen Description      WOUND BACK Performed at [facility] [address] [place], [place] [address]    Special Requests      NONE Performed at [facility], [place] [address], [place], [place] [address]    Gram Stain      RARE WBC PRESENT, PREDOMINANTLY PMN RARE GRAM POSITIVE COCCI Performed at [facility], [place] [address], [place], [place] [address]    Culture PENDING    Report Status PENDING   Glucose, capillary     Status: Abnormal   Collection Time: 07/16/18  1:12 AM  Result Value Ref Range   Glucose-Capillary 443 (H) 70 - 99 mg/dL  HIV antibody (Routine Testing)     Status: None   Collection Time: 07/16/18  2:49 AM  Result Value Ref Range   HIV Screen 4th Generation wRfx Non Reactive Non Reactive    Comment: (NOTE) Performed At: [facility] [place], [place] [id number] Rush Farmer MD DX:[id number]   MRSA PCR Screening     Status: None   Collection Time: 07/16/18  3:20 AM  Result Value Ref Range   MRSA by PCR NEGATIVE NEGATIVE    Comment:        The GeneXpert MRSA Assay (FDA approved for NASAL specimens only), is one  component of a comprehensive MRSA colonization surveillance program. It is not intended to diagnose MRSA infection nor to guide or monitor treatment for MRSA infections. Performed at [facility], [place] [address] [place], [place] [address]   Glucose, capillary     Status: Abnormal   Collection Time: 07/16/18  7:39 AM  Result Value Ref Range   Glucose-Capillary 273 (H) 70 - 99 mg/dL  Basic metabolic panel     Status: Abnormal   Collection Time:  07/16/18  9:20 AM  Result Value Ref Range   Sodium 133 (L) 135 - 145 mmol/L   Potassium 4.9 3.5 - 5.1 mmol/L   Chloride 94 (L) 98 - 111 mmol/L   CO2 25 22 - 32 mmol/L   Glucose, Bld 258 (H) 70 - 99 mg/dL   BUN 50 (H) 6 - 20 mg/dL   Creatinine, Ser 9.56 (H) 0.61 - 1.24 mg/dL   Calcium 9.5 8.9 - 10.3 mg/dL   GFR calc non Af Amer 6 (L) >60 mL/min   GFR calc Af Amer 6 (L) >60 mL/min    Comment: (NOTE) The eGFR has been calculated using the CKD EPI equation. This calculation has not been validated in all clinical situations. eGFR's persistently <60 mL/min signify possible Chronic Kidney Disease.    Anion gap 14 5 - 15    Comment: Performed at [facility] [address] [place], [place] [address]  Glucose, capillary     Status: Abnormal   Collection Time: 07/16/18 12:00 PM  Result Value Ref Range   Glucose-Capillary 170 (H) 70 - 99 mg/dL  Glucose, capillary     Status: Abnormal   Collection Time: 07/16/18  4:39 PM  Result Value Ref Range   Glucose-Capillary 268 (H) 70 - 99 mg/dL    Ct Abdomen Pelvis Wo Contrast  Result Date: 07/15/2018 CLINICAL DATA:  52 y/o M; right lower quadrant abdominal pain radiating to the left flank for 2 months. Reports mass to the right abdomen. EXAM: CT ABDOMEN AND PELVIS WITHOUT CONTRAST TECHNIQUE: Multidetector CT imaging of the abdomen and pelvis was performed following the standard protocol without IV contrast. COMPARISON:  12/16/2010 CT abdomen and pelvis. FINDINGS: Lower chest: Severe coronary artery calcific atherosclerosis and dense mitral annular calcification. Right central venous catheter tip projects over the right atrium. 2-3 mm nodules at the lung bases, some obscured by atelectasis on the prior CT. Hepatobiliary: No focal liver abnormality is seen. No gallstones, gallbladder wall thickening, or biliary dilatation. Pancreas: Unremarkable. No pancreatic ductal dilatation or surrounding inflammatory changes. Spleen: Normal in size without  focal abnormality. Adrenals/Urinary Tract: Adrenal glands are unremarkable. Atrophic kidneys bilaterally. Kidneys are otherwise normal, without renal calculi, focal lesion, or hydronephrosis. Bladder is unremarkable. Stomach/Bowel: Stomach is within normal limits. Appendix appears normal. No evidence of bowel wall thickening, distention, or inflammatory changes. Vascular/Lymphatic: Aortic atherosclerosis. No enlarged abdominal or pelvic lymph nodes. Reproductive: Prostate is unremarkable. Other: No abdominal wall hernia or abnormality. No abdominopelvic ascites. Musculoskeletal: New L2 vertebral body subcentimeter lucent foci (series 4, image 90). No acute fracture. Increased density of bones. IMPRESSION: 1. No acute process identified. 2. 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 Differential includes lytic metastasis or myeloma. No direct connection to the disc space identified to suggest Schmorl's node. Follow-up recommended to ensure stability. 3. Severe coronary and aortic calcific atherosclerosis. Electronically Signed   By: Kristine Garbe M.D.   On: 07/15/2018 17:46  Dg Chest 2 View  Result Date: 07/15/2018 CLINICAL DATA:  Sepsis.  Right lower quadrant pain. EXAM: CHEST - 2 VIEW COMPARISON:  12/16/2010 FINDINGS: There is a right jugular dialysis catheter with the tip extending into the right atrium. Low lung volumes. Left basilar densities are most compatible with atelectasis/scar and may be chronic. Cardiac silhouette appears to be within normal limits and stable but poorly characterized on this low lung volume examination. Negative for pneumothorax. No significant airspace disease or pulmonary edema. No large pleural effusions. IMPRESSION: Low lung volumes without acute findings. Presence of a dialysis catheter. Electronically Signed   By: Markus Daft M.D.   On: 07/15/2018 17:07    ROS: 10 system ROS per HPI above Blood  pressure (!) 174/98, pulse 84, temperature 97.8 F (36.6 C), temperature source Oral, resp. rate 18, height 6' 6"  (1.981 m), weight 126.6 kg (279 lb), SpO2 95 %. Gen: appears well seated in chair Eyes: anicteric ENT: MMM CV: RRR Lungs: clear Abd: area between umbilicus and RLQ with ~0GQQP circular mild protrusion at the site he says hurts, mildly TTP, no erythema or drainage, no hernia.  L flank also with an area TTP without other discernable abnormality Accesss; RIJ TDC c/d/i  Assessment/Plan: 1 Bacteremia:  Will need line exchanged if truly bacteremic.  Antibiotics per primary.  Likely will be able to complete course at outpt HD as needed. 2 ESRD: Plan HD tomorrow.  Will obtain records from outpt unit.   3 Hypertension: Hypertensive now, follow post HD.  He says his BP normally controlled.  Amlodipine 63m on MAR. 4. 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  5. 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 Ca 9.5.  Will obtain records for VDA/RA doses with HD; PTH value.    KJannifer HickA 07/16/2018, 4:44 PM

## 2018-07-16 NOTE — Progress Notes (Addendum)
Pharmacy Antibiotic Note  David Walsh is a 52 y.o. male admitted on 07/15/2018 with cellulitis/abscess.  Pharmacy has been consulted for Vancomycin dosing. WBC WNL. ESRD on HD TTS.   Plan: Vancomycin 2000 mg IV x 1, then 1000 mg IV qHD TTS Ceftriaxone per MD Trend WBC, temp, HD schedule F/U infectious work-up Drug levels as indicated   Height: 6\' 6"  (198.1 cm) Weight: 279 lb (126.6 kg) IBW/kg (Calculated) : 91.4  Temp (24hrs), Avg:97.9 F (36.6 C), Min:97.9 F (36.6 C), Max:97.9 F (36.6 C)  Recent Labs  Lab 07/15/18 1643 07/15/18 1701 07/15/18 2006  WBC 6.9  --   --   CREATININE 7.86*  --   --   LATICACIDVEN  --  1.94* 1.63    Estimated Creatinine Clearance: 16.4 mL/min (A) (by C-G formula based on SCr of 7.86 mg/dL (H)).    Allergies  Allergen Reactions  . Penicillins   . Shellfish Allergy     Narda Bonds 07/16/2018 1:26 AM

## 2018-07-17 ENCOUNTER — Inpatient Hospital Stay (HOSPITAL_COMMUNITY): Payer: Medicare Other

## 2018-07-17 ENCOUNTER — Encounter (HOSPITAL_COMMUNITY): Payer: Self-pay | Admitting: Physician Assistant

## 2018-07-17 DIAGNOSIS — R1030 Lower abdominal pain, unspecified: Secondary | ICD-10-CM

## 2018-07-17 LAB — RENAL FUNCTION PANEL
ALBUMIN: 3.4 g/dL — AB (ref 3.5–5.0)
ANION GAP: 19 — AB (ref 5–15)
BUN: 63 mg/dL — ABNORMAL HIGH (ref 6–20)
CALCIUM: 9.4 mg/dL (ref 8.9–10.3)
CO2: 22 mmol/L (ref 22–32)
Chloride: 93 mmol/L — ABNORMAL LOW (ref 98–111)
Creatinine, Ser: 11.79 mg/dL — ABNORMAL HIGH (ref 0.61–1.24)
GFR calc Af Amer: 5 mL/min — ABNORMAL LOW (ref >60)
GFR calc non Af Amer: 4 mL/min — ABNORMAL LOW (ref >60)
Glucose, Bld: 315 mg/dL — ABNORMAL HIGH (ref 70–99)
PHOSPHORUS: 9.6 mg/dL — AB (ref 2.5–4.6)
POTASSIUM: 5.5 mmol/L — AB (ref 3.5–5.1)
SODIUM: 134 mmol/L — AB (ref 135–145)

## 2018-07-17 LAB — CBC
HEMATOCRIT: 21.3 % — AB (ref 39.0–52.0)
HEMATOCRIT: 34.9 % — AB (ref 39.0–52.0)
HEMOGLOBIN: 6.7 g/dL — AB (ref 13.0–17.0)
Hemoglobin: 11.3 g/dL — ABNORMAL LOW (ref 13.0–17.0)
MCH: 29.1 pg (ref 26.0–34.0)
MCH: 29.4 pg (ref 26.0–34.0)
MCHC: 31.5 g/dL (ref 30.0–36.0)
MCHC: 32.4 g/dL (ref 30.0–36.0)
MCV: 92.6 fL (ref 78.0–100.0)
MCV: 90.9 fL (ref 78.0–100.0)
PLATELETS: 219 10*3/uL (ref 150–400)
Platelets: 281 10*3/uL (ref 150–400)
RBC: 2.3 MIL/uL — AB (ref 4.22–5.81)
RBC: 3.84 MIL/uL — AB (ref 4.22–5.81)
RDW: 14 % (ref 11.5–15.5)
RDW: 14.1 % (ref 11.5–15.5)
WBC: 9.3 10*3/uL (ref 4.0–10.5)
WBC: 6.1 10*3/uL (ref 4.0–10.5)

## 2018-07-17 LAB — HEPATITIS B SURFACE ANTIGEN: Hepatitis B Surface Ag: NEGATIVE

## 2018-07-17 LAB — GLUCOSE, CAPILLARY
GLUCOSE-CAPILLARY: 247 mg/dL — AB (ref 70–99)
GLUCOSE-CAPILLARY: 220 mg/dL — AB (ref 70–99)
Glucose-Capillary: 191 mg/dL — ABNORMAL HIGH (ref 70–99)

## 2018-07-17 MED ORDER — VANCOMYCIN HCL IN DEXTROSE 1-5 GM/200ML-% IV SOLN
INTRAVENOUS | Status: AC
Start: 2018-07-17 — End: 2018-07-17
  Filled 2018-07-17: qty 200

## 2018-07-17 MED ORDER — PANTOPRAZOLE SODIUM 40 MG PO TBEC: 40.0000 mg | DELAYED_RELEASE_TABLET | Freq: Every day | ORAL | Status: DC

## 2018-07-17 MED ORDER — SENNA 8.6 MG PO TABS
2.0000 | ORAL_TABLET | Freq: Every day | ORAL | Status: DC
  Administered 2018-07-17 – 2018-07-22 (×6): 17.2 mg via ORAL
  Filled 2018-07-17 (×6): qty 2

## 2018-07-17 MED ORDER — HEPARIN SODIUM (PORCINE) 5000 UNIT/ML IJ SOLN
5000.0000 [IU] | Freq: Three times a day (TID) | INTRAMUSCULAR | Status: DC
  Administered 2018-07-18: 5000 [IU] via SUBCUTANEOUS
  Filled 2018-07-17 (×3): qty 1

## 2018-07-17 MED ORDER — PANTOPRAZOLE SODIUM 40 MG PO TBEC
40.0000 mg | DELAYED_RELEASE_TABLET | Freq: Every day | ORAL | Status: DC
  Administered 2018-07-17 – 2018-07-23 (×6): 40 mg via ORAL
  Filled 2018-07-17 (×8): qty 1

## 2018-07-17 MED ORDER — INSULIN GLARGINE 100 UNIT/ML ~~LOC~~ SOLN
5.0000 [IU] | Freq: Every day | SUBCUTANEOUS | Status: DC
  Administered 2018-07-17: 5 [IU] via SUBCUTANEOUS
  Filled 2018-07-17: qty 0.05

## 2018-07-17 MED ORDER — VANCOMYCIN HCL IN DEXTROSE 1-5 GM/200ML-% IV SOLN
INTRAVENOUS | Status: AC
  Filled 2018-07-17: qty 200

## 2018-07-17 NOTE — Progress Notes (Addendum)
PROGRESS NOTE        PATIENT DETAILS Name: David Walsh Age: 52 y.o. Sex: male Date of Birth: March 16, 1966 Admit Date: 07/15/2018 Admitting Physician Ejiroghene Arlyce Dice, MD VOZ:[facility]  Brief Narrative: Patient is a 52 y.o. male with history of ESRD on HD TTS, right lower extremity BKA, DM, hypertension, chronic abdominal pain (ongoing for the past 2 years) presented to the ED with abdominal pain and a upper back abscess.  Patient underwent I&D-started on empiric antibiotics and is admitted to the hospitalist service.  Subjective: Seen earlier at dialysis-and again this afternoon.  He has worsening abdominal pain today-claims he vomited last night.  Assessment/Plan: Small upper back abscess: Underwent I&D in the emergency room, continue with vancomycin.  Wound in the upper back looks stable without any purulent discharge.   Coag negative staph bacteremia: 1 out of 2 blood cultures positive for gram-negative staph, since patient does have a HD catheter in place-nephrology recommending HD catheter removal, with plans to replace late Friday or Saturday (brief central line holiday).  Continue vancomycin.    Abdominal pain: Worsening abdominal pain today compared to yesterday- apparently this has been ongoing for past 2 years but worsening recently.  He did have a PD catheter in place in the past.  CT scan of the abdomen on admission did not show any acute abnormalities.  Claims he had a colonoscopy at the [place] system last year that was unremarkable.  He does have more tenderness in the bilateral mid abdominal area-we will go ahead and check a x-ray of the abdomen.  Per patient last BM was on 7/30, CT did not show any significant constipation.  ESRD: Defer care to nephrology.  DM-2: Start low-dose Lantus 5 units nightly, continue SSI and follow CBGs.  Hypertension: Relatively controlled with amlodipine-still awaiting med rec to be completed by pharmacy.     Hx of BKA, right   DVT Prophylaxis: Prophylactic Heparin   Code Status: Full code   Family Communication: None at bedside  Disposition Plan: Remain inpatient-will require a few more days of hospitalization before consideration of discharge.  Antimicrobial agents: Anti-infectives (From admission, onward)   Start     Dose/Rate Route Frequency Ordered Stop   07/17/18 1200  vancomycin (VANCOCIN) IVPB 1000 mg/200 mL premix     1,000 mg 200 mL/hr over 60 Minutes Intravenous Every T-Th-Sa (Hemodialysis) 07/16/18 0129     07/17/18 0849  vancomycin (VANCOCIN) 1-5 GM/200ML-% IVPB    Note to Pharmacy:  Rodell Perna   : cabinet override      07/17/18 0849 07/17/18 1020   07/17/18 0849  vancomycin (VANCOCIN) 1-5 GM/200ML-% IVPB    Note to Pharmacy:  Rodell Perna   : cabinet override      07/17/18 0849 07/17/18 1020   07/16/18 0015  vancomycin (VANCOCIN) IVPB 1000 mg/200 mL premix     1,000 mg 200 mL/hr over 60 Minutes Intravenous  Once 07/16/18 0006 07/16/18 2117   07/15/18 2345  cefTRIAXone (ROCEPHIN) 2 g in sodium chloride 0.9 % 100 mL IVPB  Status:  Discontinued     2 g 200 mL/hr over 30 Minutes Intravenous Daily at bedtime 07/15/18 2329 07/16/18 1552   07/15/18 1630  levofloxacin (LEVAQUIN) IVPB 750 mg     750 mg 100 mL/hr over 90 Minutes Intravenous  Once 07/15/18 1616 07/15/18 1917  07/15/18 1630  aztreonam (AZACTAM) 2 g in sodium chloride 0.9 % 100 mL IVPB     2 g 200 mL/hr over 30 Minutes Intravenous  Once 07/15/18 1616 07/15/18 2019   07/15/18 1630  vancomycin (VANCOCIN) IVPB 1000 mg/200 mL premix     1,000 mg 200 mL/hr over 60 Minutes Intravenous  Once 07/15/18 1616 07/15/18 2204      Procedures: None  CONSULTS:  None  Time spent: 25- minutes-Greater than 50% of this time was spent in counseling, explanation of diagnosis, planning of further management, and coordination of care.  MEDICATIONS: Scheduled Meds: . amLODipine  5 mg Oral Daily  .  Chlorhexidine Gluconate Cloth  6 each Topical Q0600  . [START ON 07/18/2018] heparin  5,000 Units Subcutaneous Q8H  . insulin aspart  0-15 Units Subcutaneous TID WC  . insulin glargine  5 Units Subcutaneous QHS  . polyethylene glycol  17 g Oral Daily  . senna  2 tablet Oral QHS  . sevelamer carbonate  1,600 mg Oral TID WC   Continuous Infusions: . vancomycin 1,000 mg (07/17/18 1058)   PRN Meds:.acetaminophen **OR** acetaminophen, fentaNYL (SUBLIMAZE) injection, hydrALAZINE, HYDROcodone-acetaminophen, ondansetron **OR** ondansetron (ZOFRAN) IV   PHYSICAL EXAM: Vital signs: Vitals:   07/17/18 1130 07/17/18 1200 07/17/18 1218 07/17/18 1257  BP: 122/60 126/72 (!) 141/75 (!) 155/83  Pulse: 92 (!) 102 98 97  Resp: (!) 22 (!) 21 20 18   Temp:   98.5 F (36.9 C) 98.2 F (36.8 C)  TempSrc:   Oral Oral  SpO2:   98% 92%  Weight:   133.5 kg (294 lb 5 oz)   Height:       Filed Weights   07/16/18 2047 07/17/18 0735 07/17/18 1218  Weight: 126.5 kg (278 lb 14.1 oz) (!) 136.1 kg (300 lb 0.7 oz) 133.5 kg (294 lb 5 oz)   Body mass index is 34.01 kg/m.   General appearance:Awake, alert, not in any distress.  Eyes:no scleral icterus. HEENT: Atraumatic and Normocephalic Neck: supple, no JVD. Resp:Good air entry bilaterally,no rales or rhonchi CVS: S1 S2 regular, no murmurs.  GI: Bowel sounds present, tender in the mid abdominal area bilaterally-but overall soft. Extremities: Right BKA Neurology:  Non focal Psychiatric: Normal judgment and insight. Normal mood. Musculoskeletal:No digital cyanosis Skin:No Rash, warm and dry Wounds:N/A  I have personally reviewed following labs and imaging studies  LABORATORY DATA: CBC: Recent Labs  Lab 07/15/18 1643 07/17/18 0625 07/17/18 0828  WBC 6.9 9.3 6.1  NEUTROABS 4.8  --   --   HGB 12.3* 6.7* 11.3*  HCT 36.1* 21.3* 34.9*  MCV 87.4 92.6 90.9  PLT 262 281 270    Basic Metabolic Panel: Recent Labs  Lab 07/15/18 1643 07/16/18 0920  07/16/18 1807 07/17/18 0625  NA 132* 133*  --  134*  K 5.0 4.9  --  5.5*  CL 95* 94*  --  93*  CO2 25 25  --  22  GLUCOSE 339* 258*  --  315*  BUN 43* 50*  --  63*  CREATININE 7.86* 9.56*  --  11.79*  CALCIUM 9.1 9.5  --  9.4  PHOS  --   --  8.0* 9.6*    GFR: Estimated Creatinine Clearance: 11.2 mL/min (A) (by C-G formula based on SCr of 11.79 mg/dL (H)).  Liver Function Tests: Recent Labs  Lab 07/15/18 1643 07/17/18 0625  AST 23  --   ALT 16  --   ALKPHOS 139*  --   BILITOT  0.5  --   PROT 7.7  --   ALBUMIN 3.5 3.4*   Recent Labs  Lab 07/15/18 1643  LIPASE 54*   No results for input(s): AMMONIA in the last 168 hours.  Coagulation Profile: No results for input(s): INR, PROTIME in the last 168 hours.  Cardiac Enzymes: No results for input(s): CKTOTAL, CKMB, CKMBINDEX, TROPONINI in the last 168 hours.  BNP (last 3 results) No results for input(s): PROBNP in the last 8760 hours.  HbA1C: No results for input(s): HGBA1C in the last 72 hours.  CBG: Recent Labs  Lab 07/16/18 0739 07/16/18 1200 07/16/18 1639 07/16/18 2045 07/17/18 1320  GLUCAP 273* 170* 268* 276* 191*    Lipid Profile: No results for input(s): CHOL, HDL, LDLCALC, TRIG, CHOLHDL, LDLDIRECT in the last 72 hours.  Thyroid Function Tests: No results for input(s): TSH, T4TOTAL, FREET4, T3FREE, THYROIDAB in the last 72 hours.  Anemia Panel: No results for input(s): VITAMINB12, FOLATE, FERRITIN, TIBC, IRON, RETICCTPCT in the last 72 hours.  Urine analysis:    Component Value Date/Time   COLORURINE YELLOW 12/15/2010 2309   APPEARANCEUR CLEAR 12/15/2010 2309   LABSPEC 1.021 12/15/2010 2309   PHURINE 6.5 12/15/2010 2309   GLUCOSEU 500 (A) 12/15/2010 2309   HGBUR LARGE (A) 12/15/2010 2309   BILIRUBINUR NEGATIVE 12/15/2010 2309   KETONESUR NEGATIVE 12/15/2010 2309   PROTEINUR >300 (A) 12/15/2010 2309   UROBILINOGEN 0.2 12/15/2010 2309   NITRITE NEGATIVE 12/15/2010 2309   LEUKOCYTESUR  NEGATIVE 12/15/2010 2309    Sepsis Labs: Lactic Acid, Venous    Component Value Date/Time   LATICACIDVEN 1.63 07/15/2018 2006    MICROBIOLOGY: Recent Results (from the past 240 hour(s))  Culture, blood (routine x 2)     Status: None (Preliminary result)   Collection Time: 07/15/18  4:43 PM  Result Value Ref Range Status   Specimen Description   Final    BLOOD BLOOD LEFT FOREARM Performed at [facility], [place] [address] [place], [place] [address]    Special Requests   Final    BOTTLES DRAWN AEROBIC AND ANAEROBIC Blood Culture adequate volume Performed at [facility] [address], [place], [place] [address]    Culture   Final    NO GROWTH 2 DAYS Performed at [facility] [address] [place], [place] [address]    Report Status PENDING  Incomplete  Culture, blood (routine x 2)     Status: Abnormal (Preliminary result)   Collection Time: 07/15/18  4:47 PM  Result Value Ref Range Status   Specimen Description   Final    BLOOD LEFT ANTECUBITAL Performed at [facility] [address] [place], [place] [address]    Special Requests   Final    BOTTLES DRAWN AEROBIC AND ANAEROBIC Blood Culture adequate volume Performed at [facility] [address] [place], [place] [address]    Culture  Setup Time   Final    GRAM POSITIVE COCCI IN CLUSTERS ANAEROBIC BOTTLE ONLY CRITICAL RESULT CALLED TO, READ BACK BY AND VERIFIED WITH: Jene Every PharmD 15:15 07/16/18 ([email or web address]) Performed at [facility], [place] [address], [place],  [address]    Culture STAPHYLOCOCCUS SPECIES (COAGULASE NEGATIVE) (A)  Final   Report Status PENDING  Incomplete  Blood Culture ID Panel (Reflexed)     Status: Abnormal   Collection Time: 07/15/18  4:47 PM  Result Value Ref Range Status   Enterococcus species NOT DETECTED NOT  DETECTED Final   Listeria monocytogenes NOT DETECTED NOT DETECTED Final   Staphylococcus  species DETECTED (A) NOT DETECTED Final    Comment: Methicillin (oxacillin) susceptible coagulase negative staphylococcus. Possible blood culture contaminant (unless isolated from more than one blood culture draw or clinical case suggests pathogenicity). No antibiotic treatment is indicated for blood  culture contaminants. CRITICAL RESULT CALLED TO, READ BACK BY AND VERIFIED WITH: Jene Every PharmD 15:15 07/16/18 ([email or web address])    Staphylococcus aureus NOT DETECTED NOT DETECTED Final   Methicillin resistance NOT DETECTED NOT DETECTED Final   Streptococcus species NOT DETECTED NOT DETECTED Final   Streptococcus agalactiae NOT DETECTED NOT DETECTED Final   Streptococcus pneumoniae NOT DETECTED NOT DETECTED Final   Streptococcus pyogenes NOT DETECTED NOT DETECTED Final   Acinetobacter baumannii NOT DETECTED NOT DETECTED Final   Enterobacteriaceae species NOT DETECTED NOT DETECTED Final   Enterobacter cloacae complex NOT DETECTED NOT DETECTED Final   Escherichia coli NOT DETECTED NOT DETECTED Final   Klebsiella oxytoca NOT DETECTED NOT DETECTED Final   Klebsiella pneumoniae NOT DETECTED NOT DETECTED Final   Proteus species NOT DETECTED NOT DETECTED Final   Serratia marcescens NOT DETECTED NOT DETECTED Final   Haemophilus influenzae NOT DETECTED NOT DETECTED Final   Neisseria meningitidis NOT DETECTED NOT DETECTED Final   Pseudomonas aeruginosa NOT DETECTED NOT DETECTED Final   Candida albicans NOT DETECTED NOT DETECTED Final   Candida glabrata NOT DETECTED NOT DETECTED Final   Candida krusei NOT DETECTED NOT DETECTED Final   Candida parapsilosis NOT DETECTED NOT DETECTED Final   Candida tropicalis NOT DETECTED NOT DETECTED Final    Comment: Performed at [facility], [address], [place], [place] [address]  Aerobic/Anaerobic Culture (surgical/deep wound)     Status: None (Preliminary result)   Collection Time: 07/15/18  8:53 PM  Result Value Ref Range Status   Specimen Description   Final     WOUND BACK Performed at [facility] [address] [place], [place] [address]    Special Requests   Final    NONE Performed at [facility], [place] [address] [place], [place] [address]    Gram Stain   Final    RARE WBC PRESENT, PREDOMINANTLY PMN RARE GRAM POSITIVE COCCI    Culture   Final    NO GROWTH 1 DAY Performed at [facility] [address], [place], [place] [address]    Report Status PENDING  Incomplete  MRSA PCR Screening     Status: None   Collection Time: 07/16/18  3:20 AM  Result Value Ref Range Status   MRSA by PCR NEGATIVE NEGATIVE Final    Comment:        The GeneXpert MRSA Assay (FDA approved for NASAL specimens only), is one component of a comprehensive MRSA colonization surveillance program. It is not intended to diagnose MRSA infection nor to guide or monitor treatment for MRSA infections. Performed at [facility], [place] [address] [place], [place] [address]     RADIOLOGY STUDIES/RESULTS: Ct Abdomen Pelvis Wo Contrast  Result Date: 07/15/2018 CLINICAL DATA:  52 y/o M; right lower quadrant abdominal pain radiating to the left flank for 2 months. Reports mass to the right abdomen. EXAM: CT ABDOMEN AND PELVIS WITHOUT CONTRAST TECHNIQUE: Multidetector CT imaging of the abdomen and pelvis was performed following the standard protocol without IV contrast. COMPARISON:  12/16/2010 CT abdomen and pelvis. FINDINGS: Lower chest: Severe coronary artery calcific atherosclerosis and dense  mitral annular calcification. Right central venous catheter tip projects over the right atrium. 2-3 mm nodules at the lung bases, some obscured by atelectasis on the prior CT. Hepatobiliary: No focal liver abnormality is seen. No gallstones, gallbladder wall thickening, or biliary dilatation. Pancreas: Unremarkable. No pancreatic ductal dilatation or surrounding inflammatory changes. Spleen: Normal in size without focal abnormality.  Adrenals/Urinary Tract: Adrenal glands are unremarkable. Atrophic kidneys bilaterally. Kidneys are otherwise normal, without renal calculi, focal lesion, or hydronephrosis. Bladder is unremarkable. Stomach/Bowel: Stomach is within normal limits. Appendix appears normal. No evidence of bowel wall thickening, distention, or inflammatory changes. Vascular/Lymphatic: Aortic atherosclerosis. No enlarged abdominal or pelvic lymph nodes. Reproductive: Prostate is unremarkable. Other: No abdominal wall hernia or abnormality. No abdominopelvic ascites. Musculoskeletal: New L2 vertebral body subcentimeter lucent foci (series 4, image 90). No acute fracture. Increased density of bones. IMPRESSION: 1. No acute process identified. 2. 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 Differential includes lytic metastasis or myeloma. No direct connection to the disc space identified to suggest Schmorl's node. Follow-up recommended to ensure stability. 3. Severe coronary and aortic calcific atherosclerosis. Electronically Signed   By: Kristine Garbe M.D.   On: 07/15/2018 17:46   Dg Chest 2 View  Result Date: 07/15/2018 CLINICAL DATA:  Sepsis.  Right lower quadrant pain. EXAM: CHEST - 2 VIEW COMPARISON:  12/16/2010 FINDINGS: There is a right jugular dialysis catheter with the tip extending into the right atrium. Low lung volumes. Left basilar densities are most compatible with atelectasis/scar and may be chronic. Cardiac silhouette appears to be within normal limits and stable but poorly characterized on this low lung volume examination. Negative for pneumothorax. No significant airspace disease or pulmonary edema. No large pleural effusions. IMPRESSION: Low lung volumes without acute findings. Presence of a dialysis catheter. Electronically Signed   By: Markus Daft M.D.   On: 07/15/2018 17:07     LOS: 2 days   Oren Binet, MD  [facility]  If 7PM-7AM, please contact night-coverage  Please page via [email or web address]-Password [email or web address]-click on MD name and type text message  07/17/2018, 2:09 PM

## 2018-07-17 NOTE — Progress Notes (Signed)
Repeat Hem 11.3. Paged Dr. Donnie Aho.

## 2018-07-17 NOTE — Progress Notes (Addendum)
[facility] Progress Note   Subjective:   Feeling slightly better.  No new complaints. Telling me about his saga with the abdominal pain - says he may be willing to get AVF in future  Objective Vitals:   07/16/18 1713 07/16/18 2047 07/17/18 0520 07/17/18 0748  BP: (!) 178/98 (!) 192/96 (!) 161/98 (!) 194/101  Pulse: 90 94 98 97  Resp: 18 18 18 19   Temp: 98.4 F (36.9 C) 99.1 F (37.3 C) 97.6 F (36.4 C)   TempSrc: Oral Oral Oral   SpO2: 96% 98% 98%   Weight:  126.5 kg (278 lb 14.1 oz)    Height:       Physical Exam General:NAD, obese, pleasant male Heart:RRR Lungs:CTAB Abdomen:obese, +BS, mild swelling around old PD cath site  Extremities:no edema Dialysis Access: [facility] accessed  [facility] Weights   07/15/18 1648 07/15/18 2204 07/16/18 2047  Weight: 126.6 kg (279 lb) 126.6 kg (279 lb) 126.5 kg (278 lb 14.1 oz)    Intake/Output Summary (Last 24 hours) at 07/17/2018 0813 Last data filed at 07/17/2018 0520 Gross per 24 hour  Intake 0 ml  Output 0 ml  Net 0 ml    Additional Objective Labs: Basic Metabolic Panel: Recent Labs  Lab 07/15/18 1643 07/16/18 0920 07/16/18 1807  NA 132* 133*  --   K 5.0 4.9  --   CL 95* 94*  --   CO2 25 25  --   GLUCOSE 339* 258*  --   BUN 43* 50*  --   CREATININE 7.86* 9.56*  --   CALCIUM 9.1 9.5  --   PHOS  --   --  8.0*   Liver Function Tests: Recent Labs  Lab 07/15/18 1643  AST 23  ALT 16  ALKPHOS 139*  BILITOT 0.5  PROT 7.7  ALBUMIN 3.5   Recent Labs  Lab 07/15/18 1643  LIPASE 54*   CBC: Recent Labs  Lab 07/15/18 1643  WBC 6.9  NEUTROABS 4.8  HGB 12.3*  HCT 36.1*  MCV 87.4  PLT 262   Blood Culture    Component Value Date/Time   SDES  07/15/2018 2053    WOUND BACK Performed at [facility], [place] [address] [place], [place] [address]    SPECREQUEST  07/15/2018 2053    NONE Performed at [facility], [place] [address], [place], [place] [address]     CULT PENDING 07/15/2018 2053   REPTSTATUS PENDING 07/15/2018 2053    CBG: Recent Labs  Lab 07/16/18 0112 07/16/18 0739 07/16/18 1200 07/16/18 1639 07/16/18 2045  GLUCAP 443* 273* 170* 268* 276*   Studies/Results: Ct Abdomen Pelvis Wo Contrast  Result Date: 07/15/2018 CLINICAL DATA:  52 y/o M; right lower quadrant abdominal pain radiating to the left flank for 2 months. Reports mass to the right abdomen. EXAM: CT ABDOMEN AND PELVIS WITHOUT CONTRAST TECHNIQUE: Multidetector CT imaging of the abdomen and pelvis was performed following the standard protocol without IV contrast. COMPARISON:  12/16/2010 CT abdomen and pelvis. FINDINGS: Lower chest: Severe coronary artery calcific atherosclerosis and dense mitral annular calcification. Right central venous catheter tip projects over the right atrium. 2-3 mm nodules at the lung bases, some obscured by atelectasis on the prior CT. Hepatobiliary: No focal liver abnormality is seen. No gallstones, gallbladder wall thickening, or biliary dilatation. Pancreas: Unremarkable. No pancreatic ductal dilatation or surrounding inflammatory changes. Spleen: Normal in size without focal abnormality. Adrenals/Urinary Tract: Adrenal glands are unremarkable. Atrophic kidneys bilaterally.  Kidneys are otherwise normal, without renal calculi, focal lesion, or hydronephrosis. Bladder is unremarkable. Stomach/Bowel: Stomach is within normal limits. Appendix appears normal. No evidence of bowel wall thickening, distention, or inflammatory changes. Vascular/Lymphatic: Aortic atherosclerosis. No enlarged abdominal or pelvic lymph nodes. Reproductive: Prostate is unremarkable. Other: No abdominal wall hernia or abnormality. No abdominopelvic ascites. Musculoskeletal: New L2 vertebral body subcentimeter lucent foci (series 4, image 90). No acute fracture. Increased density of bones. IMPRESSION: 1. No acute process identified. 2. 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 Differential includes lytic metastasis or myeloma. No direct connection to the disc space identified to suggest Schmorl's node. Follow-up recommended to ensure stability. 3. Severe coronary and aortic calcific atherosclerosis. Electronically Signed   By: Kristine Garbe M.D.   On: 07/15/2018 17:46   Dg Chest 2 View  Result Date: 07/15/2018 CLINICAL DATA:  Sepsis.  Right lower quadrant pain. EXAM: CHEST - 2 VIEW COMPARISON:  12/16/2010 FINDINGS: There is a right jugular dialysis catheter with the tip extending into the right atrium. Low lung volumes. Left basilar densities are most compatible with atelectasis/scar and may be chronic. Cardiac silhouette appears to be within normal limits and stable but poorly characterized on this low lung volume examination. Negative for pneumothorax. No significant airspace disease or pulmonary edema. No large pleural effusions. IMPRESSION: Low lung volumes without acute findings. Presence of a dialysis catheter. Electronically Signed   By: Markus Daft M.D.   On: 07/15/2018 17:07    Medications: . vancomycin     . amLODipine  5 mg Oral Daily  . Chlorhexidine Gluconate Cloth  6 each Topical Q0600  . heparin  5,000 Units Subcutaneous Q8H  . insulin aspart  0-15 Units Subcutaneous TID WC  . polyethylene glycol  17 g Oral Daily  . sevelamer carbonate  1,600 mg Oral TID WC    Dialysis Orders:  [facility] [place]- TTS  Assessment/Plan: 1. Bacteremia -  With resultant abscess +BC for staph-MSSE. Need line change. Consulting IR for [facility] removal today post HD for line holiday - plan to  place a new TDC on  Late Fri vs Sat. On Vanc- probably can simplify to ancef ?Marland Kitchen  Can get abx as OP at kidney center 2. ESRD - HD with [facility].  UFG 3.5L. Consulting IR for [facility] removal today post HD for line holiday, then place new one on sat to facilitate dialysis per regular schedule. Will need to stay in pt thru Sat to  make sure new line is functional.  Understands the utility of having AVF but would not place in the setting of active infection- in a few weeks would be fine, sounds like [facility] are in motion [facility] 3. Anemia of CKD- Hgb 11.3, no indication for ESA at this time.  4. Secondary hyperparathyroidism - Ca in goal. Phos elevated. Continue binders (renvela).  5. HTN/volume - BP elevated. Expect improvement post HD. Also on amlodipine 5 mg daily  6. Nutrition - Alb 3.4. Renal diet with fluid restrictions.   Jen Mow, PA-C [facility] Pager: [phone] 07/17/2018,8:13 AM  LOS: 2 days    Patient seen and examined, agree with above note with above modifications. Seen in HD- NAD- blood cultures positive for MSSE- line has been in a long time and with complicating abscess.  I think needs line removal and replacement- have contacted IR for removal after HD today - to replace late Fri or Sat to  get next HD Sat here (make sure line works).  Plans to get AVF as OP which I have told him is a good idea  Corliss Parish, MD 07/17/2018

## 2018-07-17 NOTE — Progress Notes (Signed)
Referring Physician(s): Dr Shirl Harris  Supervising Physician: Markus Daft  Patient Status:  David Walsh - In-pt  Chief Complaint:  ESRD Bacteremia  Subjective:  Pt came to [facility] for abd pain and back abscess Back abscess was treated as OP with 2 courses of antibiotics I/D in ED 7/30- [facility] +Staph 7/30 Rt IJ tunneled dialysis catheter placed at [facility] 3 yrs ago With new bacteremia-- Renal MD asking for removal - Holiday - and replacement On Vancomycin IV now  Pt states he is due for dialysis arm graft to be placed in [facility] in a few weeks He also states that his wife has been deemed a match to donate kidney-- this too is in process    Of arrangement.    Allergies: Shellfish allergy and Penicillins  Medications: Prior to Admission medications   Not on File     Vital Signs: BP (!) 157/93   Pulse 98   Temp 97.8 F (36.6 C) (Oral)   Resp 20   Ht 6\' 6"  (1.981 m)   Wt (!) 300 lb 0.7 oz (136.1 kg)   SpO2 98%   BMI 34.67 kg/m   Physical Exam  Constitutional: He is oriented to person, place, and time.  Neurological: He is alert and oriented to person, place, and time.  Psychiatric: He has a normal mood and affect. His behavior is normal.  Vitals reviewed.   Imaging: Ct Abdomen Pelvis Wo Contrast  Result Date: 07/15/2018 CLINICAL DATA:  52 y/o M; right lower quadrant abdominal pain radiating to the left flank for 2 months. Reports mass to the right abdomen. EXAM: CT ABDOMEN AND PELVIS WITHOUT CONTRAST TECHNIQUE: Multidetector CT imaging of the abdomen and pelvis was performed following the standard protocol without IV contrast. COMPARISON:  12/16/2010 CT abdomen and pelvis. FINDINGS: Lower chest: Severe coronary artery calcific atherosclerosis and dense mitral annular calcification. Right central venous catheter tip projects over the right atrium. 2-3 mm nodules at the lung bases, some obscured by atelectasis on the prior CT. Hepatobiliary: No focal  liver abnormality is seen. No gallstones, gallbladder wall thickening, or biliary dilatation. Pancreas: Unremarkable. No pancreatic ductal dilatation or surrounding inflammatory changes. Spleen: Normal in size without focal abnormality. Adrenals/Urinary Tract: Adrenal glands are unremarkable. Atrophic kidneys bilaterally. Kidneys are otherwise normal, without renal calculi, focal lesion, or hydronephrosis. Bladder is unremarkable. Stomach/Bowel: Stomach is within normal limits. Appendix appears normal. No evidence of bowel wall thickening, distention, or inflammatory changes. Vascular/Lymphatic: Aortic atherosclerosis. No enlarged abdominal or pelvic lymph nodes. Reproductive: Prostate is unremarkable. Other: No abdominal wall hernia or abnormality. No abdominopelvic ascites. Musculoskeletal: New L2 vertebral body subcentimeter lucent foci (series 4, image 90). No acute fracture. Increased density of bones. IMPRESSION: 1. No acute process identified. 2. 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 Differential includes lytic metastasis or myeloma. No direct connection to the disc space identified to suggest Schmorl's node. Follow-up recommended to ensure stability. 3. Severe coronary and aortic calcific atherosclerosis. Electronically Signed   By: Kristine Garbe M.D.   On: 07/15/2018 17:46   Dg Chest 2 View  Result Date: 07/15/2018 CLINICAL DATA:  Sepsis.  Right lower quadrant pain. EXAM: CHEST - 2 VIEW COMPARISON:  12/16/2010 FINDINGS: There is a right jugular dialysis catheter with the tip extending into the right atrium. Low lung volumes. Left basilar densities are most compatible with atelectasis/scar and may be chronic. Cardiac silhouette appears to be within normal limits  and stable but poorly characterized on this low lung volume examination. Negative for pneumothorax. No significant airspace disease or pulmonary edema. No large  pleural effusions. IMPRESSION: Low lung volumes without acute findings. Presence of a dialysis catheter. Electronically Signed   By: Markus Daft M.D.   On: 07/15/2018 17:07    Labs:  CBC: Recent Labs    07/15/18 1643 07/17/18 0625 07/17/18 0828  WBC 6.9 9.3 6.1  HGB 12.3* 6.7* 11.3*  HCT 36.1* 21.3* 34.9*  PLT 262 281 219    COAGS: No results for input(s): INR, APTT in the last 8760 hours.  BMP: Recent Labs    07/15/18 1643 07/16/18 0920 07/17/18 0625  NA 132* 133* 134*  K 5.0 4.9 5.5*  CL 95* 94* 93*  CO2 25 25 22   GLUCOSE 339* 258* 315*  BUN 43* 50* 63*  CALCIUM 9.1 9.5 9.4  CREATININE 7.86* 9.56* 11.79*  GFRNONAA 7* 6* 4*  GFRAA 8* 6* 5*    LIVER FUNCTION TESTS: Recent Labs    07/15/18 1643 07/17/18 0625  BILITOT 0.5  --   AST 23  --   ALT 16  --   ALKPHOS 139*  --   PROT 7.7  --   ALBUMIN 3.5 3.4*    Assessment and Plan:  Bacteremia + staph For Rt IJ tunneled dialysis catheter removal  (MD wants replacement Sat-- will discuss with IR Rad for scheduling--poss Mon?) Pt is aware of procedure benefits and risks- including But not limited to: infection; vessel damage; bleeding Agreeable to proceed Consent signed and in chart  Electronically Signed: Jeremie Giangrande A, PA-C 07/17/2018, 9:47 AM   I spent a total of 25 Minutes at the the patient's bedside AND on the patient's hospital floor or unit, greater than 50% of which was counseling/coordinating care for HD tunneled cath removal

## 2018-07-17 NOTE — Progress Notes (Signed)
Paged Dr. Sloan Leiter, pts critical lab Hemoglobin 6.7,  FYI.

## 2018-07-17 NOTE — Progress Notes (Signed)
Inpatient Diabetes Program Recommendations  AACE/ADA: New Consensus Statement on Inpatient Glycemic Control (2015)  Target Ranges:  Prepandial:   less than 140 mg/dL      Peak postprandial:   less than 180 mg/dL (1-2 hours)      Critically ill patients:  140 - 180 mg/dL   Review of Glycemic Control  Diabetes history: DM 2 Outpatient Diabetes medications: Lantus 5 units Daily, Novolog 0-6 units tid Current orders for Inpatient glycemic control: Novolog 0-15 units tid  Inpatient Diabetes Program Recommendations:    Per patient report he takes Lantus 5 units in the morning at home. Please consider adding Lantus 5 units Daily.  Thanks,  Tama Headings RN, MSN, BC-ADM Inpatient Diabetes Coordinator Team Pager [phone] (8a-5p)

## 2018-07-17 NOTE — Consult Note (Addendum)
[facility] Consult: 2:57 PM 07/17/2018  LOS: 2 days    Referring Provider: Dr Sloan Leiter  Primary Care Physician:  [place] Primary Gastroenterologist:  At [facility] in [facility]    Reason for Consultation:  Abdominal pain and vomiting.    HPI: David Walsh is a 52 y.o. male.  Hx ESRD on hemodialysis.  Obesity.  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  IDDM.   Anemia of chronic dz.  A fib, not on AC.    Chronic abdominal pain.  Primarily this is in the lower abdomen and more on the right side.  Present for many years, says it started during the 2 years or so he had PD catheter in place in RLQ.  It is not exacerbated as a result of hemodialysis.  Not exacerbated by food.  Not relieved by bowel movements.  Moves his bowels daily, they are brown and nonbloody.   Has been evaluated in the ED at [facility] and [facility] and at [place] in [place].  CT scans have been unrevealing as to cause.   He just had a CTs abdomen pelvis without contrast on the 17th and 23rd May, one was at [facility], the other at [facility].   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  Scattered hypoattenuating lesions in the lumbar spine new compared with 2015 favored to reflect venous malformations versus Schmorl's nodes.  Gynecomastia.  Minimal nodular stranding in the anterior left abdominal wall, likely related to injection sites.   The second CT showed some fecal retention and a small, fat-containing, mid abdominal hernia on the right as well as at the umbilicus region.  There was suspicion for small hemangiomas in the L2 vertebral body. He had a colonoscopy in the spring 2018 that was unremarkable.    Over several years the patient has improved his diet and his weight has dropped ~ 100#. Generally he has a  good appetite.  No nausea.  No dysphagia.  Patient has not ever had upper endoscopy.  He is not on PPI or H2 blocker at home.  Because of his kidney disease he does not take NSAIDs and he does not take daily aspirin.  He does not drink alcohol.  Last night pain got worse and was pretty bad in the left lower quadrant as well as the right lower quadrant.  He had nonbloody emesis, mostly clear material.  He vomited same material this morning.  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  Other differential includes lytic mets or myeloma.  It also showed severe coronary and aortic calcific atherosclerosis.   All GI organs were unremarkable   On his upper back there was an abscess.  This is been incised/drained.  Initial Hgb at 630 this morning was 6.7, it was rechecked and measured 11.3 at 830 this morning. LFTs reveal elevated alkaline phosphatase at 139.  His lipase is slightly elevated at 54.  Total bilirubin, transaminases normal.  Glucose 339.  Past Medical History:  Diagnosis Date  . Anxiety   . Arthritis   . Diabetes mellitus without complication ([place])   . Hx of BKA ([place])   . Neuropathy   . Renal disorder     Past Surgical History:  Procedure Laterality Date  . BELOW KNEE LEG AMPUTATION  2014   Right.  Due to non-healing foot ulcer.  . REPLACEMENT TOTAL KNEE Left     Prior to Admission medications   Not on File    Scheduled Meds: . amLODipine  5 mg Oral Daily  . Chlorhexidine Gluconate Cloth  6 each Topical Q0600  . [START ON 07/18/2018] heparin  5,000 Units Subcutaneous Q8H  . insulin aspart  0-15 Units Subcutaneous TID WC  . insulin glargine  5 Units Subcutaneous QHS  . polyethylene glycol  17 g Oral Daily  . senna  2 tablet Oral QHS  . sevelamer carbonate  1,600 mg Oral TID WC   Infusions: . vancomycin 1,000 mg (07/17/18 1058)   PRN Meds: acetaminophen **OR** acetaminophen, fentaNYL  (SUBLIMAZE) injection, hydrALAZINE, HYDROcodone-acetaminophen, ondansetron **OR** ondansetron (ZOFRAN) IV   Allergies as of 07/15/2018 - Review Complete 07/15/2018  Allergen Reaction Noted  . Penicillins  07/15/2018  . Shellfish allergy  07/15/2018    Family History  Problem Relation Age of Onset  . Arthritis Mother   . Heart attack Father   . Hypertension Sister     Social History   Socioeconomic History  . Marital status: Single    Spouse name: Not on file  . Number of children: Not on file  . Years of education: Not on file  . Highest education level: Not on file  Occupational History  . Not on file  Social Needs  . Financial resource strain: Not on file  . Food insecurity:    Worry: Not on file    Inability: Not on file  . Transportation needs:    Medical: Not on file    Non-medical: Not on file  Tobacco Use  . Smoking status: Current Some Day Smoker    Types: Cigars  . Smokeless tobacco: Never Used  Substance and Sexual Activity  . Alcohol use: Not Currently  . Drug use: Not Currently  . Sexual activity: Yes    Birth control/protection: None  Lifestyle  . Physical activity:    Days per week: Not on file    Minutes per session: Not on file  . Stress: Not on file  Relationships  . Social connections:    Talks on phone: Not on file    Gets together: Not on file    Attends religious service: Not on file    Active member of club or organization: Not on file    Attends meetings of clubs or organizations: Not on file    Relationship status: Not on file  . Intimate partner violence:    Fear of current or ex partner: Not on file    Emotionally abused: Not on file    Physically abused: Not on file    Forced sexual activity: Not on file  Other Topics Concern  . Not on file  Social History Narrative  . Not on file    REVIEW OF SYSTEMS: Constitutional: Some fatigue and weakness. ENT:  No nose bleeds Pulm: No new dyspnea.  No cough. CV:  No palpitations,  no LE edema.  No chest pain. GU:  No hematuria, no frequency GI:  Per HPI Heme: No unusual bleeding or bruising. Transfusions: None.  Neuro:  No headaches, no peripheral tingling or numbness Derm:  No itching, no rash or sores.  Endocrine:  No sweats or chills.  No polyuria or dysuria Immunization: Not queried. Travel:  None beyond local counties in last few months.    PHYSICAL EXAM: Vital signs in last 24 hours: Vitals:   07/17/18 1218 07/17/18 1257  BP: (!) 141/75 (!) 155/83  Pulse: 98 97  Resp: 20 18  Temp: 98.5 F (36.9 C) 98.2 F (36.8 C)  SpO2: 98% 92%   Wt Readings from Last 3 Encounters:  07/17/18 294 lb 5 oz (133.5 kg)    General: Obese, uncomfortable, unwell looking but not toxic appearing AAM Head: No facial asymmetry or swelling.  No signs of head trauma. Eyes: No scleral icterus, no conjunctival pallor.  EOMI. Ears: Not hard of hearing Nose: No congestion or discharge Mouth: Tongue midline.  Oral mucosa pink, moist, clear. Neck: No JVD, no masses.  No thyromegaly. Lungs: Clear bilaterally overall reduced breath sounds.  No adventitious sounds.  No dyspnea, no cough Heart: RRR.  No MRG.  S1, S2 present Abdomen: Soft, nondistended.  Bowel sounds hypoactive but normal quality.  No masses, HSM, bruits, hernias.  Tenderness throughout the abdomen but no guarding or rebound..   Rectal: Deferred Musc/Skeltl: No joint redness.  Surgical scar consistent with left TKR Extremities: Right BKA, prosthetic in place not removed for exam. Neurologic: Alert.  Moves all 4 limbs, no tremor, no obvious deficits. Skin: No rashes, sores or suspicious lesions. Nodes: No cervical adenopathy. Psych: Affect a bit blunted, pleasant, cooperative.  Calm.  Intake/Output from previous day: No intake/output data recorded. Intake/Output this shift: Total I/O In: 0  Out: 2600 [Other:2600]  LAB RESULTS: Recent Labs    07/15/18 1643 07/17/18 0625 07/17/18 0828  WBC 6.9 9.3 6.1    HGB 12.3* 6.7* 11.3*  HCT 36.1* 21.3* 34.9*  PLT 262 281 219   BMET Lab Results  Component Value Date   NA 134 (L) 07/17/2018   NA 133 (L) 07/16/2018   NA 132 (L) 07/15/2018   K 5.5 (H) 07/17/2018   K 4.9 07/16/2018   K 5.0 07/15/2018   CL 93 (L) 07/17/2018   CL 94 (L) 07/16/2018   CL 95 (L) 07/15/2018   CO2 22 07/17/2018   CO2 25 07/16/2018   CO2 25 07/15/2018   GLUCOSE 315 (H) 07/17/2018   GLUCOSE 258 (H) 07/16/2018   GLUCOSE 339 (H) 07/15/2018   BUN 63 (H) 07/17/2018   BUN 50 (H) 07/16/2018   BUN 43 (H) 07/15/2018   CREATININE 11.79 (H) 07/17/2018   CREATININE 9.56 (H) 07/16/2018   CREATININE 7.86 (H) 07/15/2018   CALCIUM 9.4 07/17/2018   CALCIUM 9.5 07/16/2018   CALCIUM 9.1 07/15/2018   LFT Recent Labs    07/15/18 1643 07/17/18 0625  PROT 7.7  --   ALBUMIN 3.5 3.4*  AST 23  --   ALT 16  --   ALKPHOS 139*  --   BILITOT 0.5  --    PT/INR No results found for: INR, PROTIME Hepatitis Panel Recent Labs    07/17/18 0847  HEPBSAG Negative   C-Diff No components found for: CDIFF Lipase     Component Value Date/Time   LIPASE 54 (H) 07/15/2018 1643    Drugs of Abuse     Component Value Date/Time   LABOPIA NEGATIVE 12/16/2010 [place] 12/16/2010 1600   LABBENZ NEGATIVE 12/16/2010 1600   AMPHETMU NEGATIVE 12/16/2010  1600     RADIOLOGY STUDIES: Ct Abdomen Pelvis Wo Contrast  Result Date: 07/15/2018 CLINICAL DATA:  52 y/o M; right lower quadrant abdominal pain radiating to the left flank for 2 months. Reports mass to the right abdomen. EXAM: CT ABDOMEN AND PELVIS WITHOUT CONTRAST TECHNIQUE: Multidetector CT imaging of the abdomen and pelvis was performed following the standard protocol without IV contrast. COMPARISON:  12/16/2010 CT abdomen and pelvis. FINDINGS: Lower chest: Severe coronary artery calcific atherosclerosis and dense mitral annular calcification. Right central venous catheter tip projects over the right atrium. 2-3 mm  nodules at the lung bases, some obscured by atelectasis on the prior CT. Hepatobiliary: No focal liver abnormality is seen. No gallstones, gallbladder wall thickening, or biliary dilatation. Pancreas: Unremarkable. No pancreatic ductal dilatation or surrounding inflammatory changes. Spleen: Normal in size without focal abnormality. Adrenals/Urinary Tract: Adrenal glands are unremarkable. Atrophic kidneys bilaterally. Kidneys are otherwise normal, without renal calculi, focal lesion, or hydronephrosis. Bladder is unremarkable. Stomach/Bowel: Stomach is within normal limits. Appendix appears normal. No evidence of bowel wall thickening, distention, or inflammatory changes. Vascular/Lymphatic: Aortic atherosclerosis. No enlarged abdominal or pelvic lymph nodes. Reproductive: Prostate is unremarkable. Other: No abdominal wall hernia or abnormality. No abdominopelvic ascites. Musculoskeletal: New L2 vertebral body subcentimeter lucent foci (series 4, image 90). No acute fracture. Increased density of bones. IMPRESSION: 1. No acute process identified. 2. 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 Differential includes lytic metastasis or myeloma. No direct connection to the disc space identified to suggest Schmorl's node. Follow-up recommended to ensure stability. 3. Severe coronary and aortic calcific atherosclerosis. Electronically Signed   By: Kristine Garbe M.D.   On: 07/15/2018 17:46   Dg Chest 2 View  Result Date: 07/15/2018 CLINICAL DATA:  Sepsis.  Right lower quadrant pain. EXAM: CHEST - 2 VIEW COMPARISON:  12/16/2010 FINDINGS: There is a right jugular dialysis catheter with the tip extending into the right atrium. Low lung volumes. Left basilar densities are most compatible with atelectasis/scar and may be chronic. Cardiac silhouette appears to be within normal limits and stable but poorly characterized on this low lung volume  examination. Negative for pneumothorax. No significant airspace disease or pulmonary edema. No large pleural effusions. IMPRESSION: Low lung volumes without acute findings. Presence of a dialysis catheter. Electronically Signed   By: Markus Daft M.D.   On: 07/15/2018 17:07      IMPRESSION:   *     Acute on chronic abdominal pain.  Previous CT scans, colonoscopy in 2018 unrevealing as to cause.  Acute nonbloody emesis. ? Diabetic gastroparesis, ? Scar tissue without obstruction from previous PD catheter.    *    IDDM.  Does not appear well-controlled.  *     ESRD.  Has been on hemodialysis for 5 years, previously on peritoneal dialysis for a couple of years before that.  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 Followed by [facility] renal transplant team.  Transplant being postponed until patient is able to get his glucose under better control and he is closer to the goal weight of 250 #  *   Upper back abscess, s/p I&D.  Coag negative staph on blood clx PCR.  On Vancomycin IV.      PLAN:     *    Patient is to undergo plain abdominal films shortly.  *   Starting PPI.    *  Consider upper endoscopy.   Azucena Freed  07/17/2018, 2:57 PM Phone [phone]

## 2018-07-17 NOTE — Procedures (Signed)
Patient was seen on dialysis and the procedure was supervised.  BFR 350  Via TDC BP is  157/93.   Patient appears to be tolerating treatment well- has positive blood cultures- needs PC removed after tx  David Walsh A 07/17/2018

## 2018-07-18 ENCOUNTER — Encounter (HOSPITAL_COMMUNITY): Payer: Self-pay | Admitting: Physician Assistant

## 2018-07-18 ENCOUNTER — Inpatient Hospital Stay (HOSPITAL_COMMUNITY): Payer: Medicare Other

## 2018-07-18 HISTORY — PX: IR REMOVAL TUN CV CATH W/O FL: IMG2289

## 2018-07-18 LAB — CBC
HEMATOCRIT: 35 % — AB (ref 39.0–52.0)
HEMOGLOBIN: 11.2 g/dL — AB (ref 13.0–17.0)
MCH: 29.4 pg (ref 26.0–34.0)
MCHC: 32 g/dL (ref 30.0–36.0)
MCV: 91.9 fL (ref 78.0–100.0)
Platelets: 192 10*3/uL (ref 150–400)
RBC: 3.81 MIL/uL — AB (ref 4.22–5.81)
RDW: 14.1 % (ref 11.5–15.5)
WBC: 6.9 10*3/uL (ref 4.0–10.5)

## 2018-07-18 LAB — RENAL FUNCTION PANEL
ANION GAP: 16 — AB (ref 5–15)
Albumin: 3.3 g/dL — ABNORMAL LOW (ref 3.5–5.0)
BUN: 39 mg/dL — ABNORMAL HIGH (ref 6–20)
CHLORIDE: 94 mmol/L — AB (ref 98–111)
CO2: 25 mmol/L (ref 22–32)
Calcium: 9.3 mg/dL (ref 8.9–10.3)
Creatinine, Ser: 8.97 mg/dL — ABNORMAL HIGH (ref 0.61–1.24)
GFR calc non Af Amer: 6 mL/min — ABNORMAL LOW (ref >60)
GFR, EST AFRICAN AMERICAN: 7 mL/min — AB (ref >60)
GLUCOSE: 326 mg/dL — AB (ref 70–99)
Phosphorus: 7.8 mg/dL — ABNORMAL HIGH (ref 2.5–4.6)
Potassium: 5.3 mmol/L — ABNORMAL HIGH (ref 3.5–5.1)
Sodium: 135 mmol/L (ref 135–145)

## 2018-07-18 LAB — GLUCOSE, CAPILLARY
Glucose-Capillary: 329 mg/dL — ABNORMAL HIGH (ref 70–99)
Glucose-Capillary: 286 mg/dL — ABNORMAL HIGH (ref 70–99)
Glucose-Capillary: 322 mg/dL — ABNORMAL HIGH (ref 70–99)
Glucose-Capillary: 216 mg/dL — ABNORMAL HIGH (ref 70–99)

## 2018-07-18 LAB — HEPATITIS B CORE ANTIBODY, TOTAL: Hep B Core Total Ab: NEGATIVE

## 2018-07-18 LAB — PROTIME-INR
INR: 1.12
Prothrombin Time: 14.3 seconds (ref 11.4–15.2)

## 2018-07-18 MED ORDER — DULOXETINE HCL 20 MG PO CPEP
20.0000 mg | ORAL_CAPSULE | Freq: Every day | ORAL | Status: DC
  Filled 2018-07-18: qty 1

## 2018-07-18 MED ORDER — CEFAZOLIN SODIUM-DEXTROSE 1-4 GM/50ML-% IV SOLN
1.0000 g | INTRAVENOUS | Status: AC
  Administered 2018-07-18 – 2018-07-21 (×4): 1 g via INTRAVENOUS
  Filled 2018-07-18 (×5): qty 50

## 2018-07-18 MED ORDER — VANCOMYCIN HCL IN DEXTROSE 1-5 GM/200ML-% IV SOLN
1000.0000 mg | INTRAVENOUS | Status: AC
  Filled 2018-07-18: qty 200

## 2018-07-18 MED ORDER — CHLORHEXIDINE GLUCONATE CLOTH 2 % EX PADS
6.0000 | MEDICATED_PAD | Freq: Every day | CUTANEOUS | Status: DC
  Administered 2018-07-20 – 2018-07-23 (×2): 6 via TOPICAL

## 2018-07-18 MED ORDER — INSULIN GLARGINE 100 UNIT/ML ~~LOC~~ SOLN
8.0000 [IU] | Freq: Every day | SUBCUTANEOUS | Status: DC
  Administered 2018-07-18 – 2018-07-20 (×3): 8 [IU] via SUBCUTANEOUS
  Filled 2018-07-18 (×4): qty 0.08

## 2018-07-18 MED ORDER — HYDRALAZINE HCL 50 MG PO TABS
50.0000 mg | ORAL_TABLET | Freq: Three times a day (TID) | ORAL | Status: DC
  Administered 2018-07-18 – 2018-07-23 (×12): 50 mg via ORAL
  Filled 2018-07-18 (×12): qty 1

## 2018-07-18 MED ORDER — RENA-VITE PO TABS
1.0000 | ORAL_TABLET | Freq: Every day | ORAL | Status: DC
Start: 2018-07-18 — End: 2018-07-23
  Administered 2018-07-18 – 2018-07-22 (×5): 1 via ORAL
  Filled 2018-07-18 (×5): qty 1

## 2018-07-18 MED ORDER — LEVOTHYROXINE SODIUM 25 MCG PO TABS
25.0000 ug | ORAL_TABLET | Freq: Every day | ORAL | Status: DC
  Administered 2018-07-19 – 2018-07-23 (×5): 25 ug via ORAL
  Filled 2018-07-18 (×6): qty 1

## 2018-07-18 MED ORDER — GABAPENTIN 600 MG PO TABS
300.0000 mg | ORAL_TABLET | Freq: Every day | ORAL | Status: DC
  Administered 2018-07-18 – 2018-07-22 (×5): 300 mg via ORAL
  Filled 2018-07-18 (×5): qty 1

## 2018-07-18 MED ORDER — GABAPENTIN 300 MG PO CAPS: 300.0000 mg | ORAL_CAPSULE | Freq: Two times a day (BID) | ORAL | Status: DC

## 2018-07-18 MED ORDER — HEPARIN SODIUM (PORCINE) 5000 UNIT/ML IJ SOLN
5000.0000 [IU] | Freq: Three times a day (TID) | INTRAMUSCULAR | Status: AC
  Administered 2018-07-18: 5000 [IU] via SUBCUTANEOUS
  Filled 2018-07-18: qty 1

## 2018-07-18 MED ORDER — METOPROLOL TARTRATE 100 MG PO TABS
100.0000 mg | ORAL_TABLET | Freq: Two times a day (BID) | ORAL | Status: DC
  Administered 2018-07-18 – 2018-07-23 (×9): 100 mg via ORAL
  Filled 2018-07-18 (×10): qty 1

## 2018-07-18 MED ORDER — CHLORHEXIDINE GLUCONATE 4 % EX LIQD
CUTANEOUS | Status: AC
  Filled 2018-07-18: qty 15

## 2018-07-18 MED ORDER — LIDOCAINE HCL (PF) 2 % IJ SOLN
INTRAMUSCULAR | Status: AC
Start: 2018-07-18 — End: 2018-07-19
  Filled 2018-07-18: qty 20

## 2018-07-18 MED ORDER — LIDOCAINE 5 % EX PTCH
2.0000 | MEDICATED_PATCH | CUTANEOUS | Status: DC
  Administered 2018-07-18 – 2018-07-23 (×6): 2 via TRANSDERMAL
  Filled 2018-07-18 (×6): qty 2

## 2018-07-18 NOTE — Progress Notes (Signed)
Pharmacy Antibiotic Note  David Walsh is a 52 y.o. male admitted on 07/15/2018 with cellulitis/abscess.  Pharmacy now narrowing antibiotics for MSSE in blood cultures. Pharmacy has been consulted to narrow to Cefazolin. ESRD on HD TTS however planning for [facility] removal/replacement and it is uncertain when this will be completed.   Plan: - D/c Vancomycin - Start Cefazolin 1g IV every 24 hours for now - Once [facility] removed/replaced and HD on schedule - can transition to 2g on HD days only - Will continue to follow HD schedule/duration, culture results, LOT, and antibiotic de-escalation plans   Height: 6\' 6"  (198.1 cm) Weight: 294 lb 5 oz (133.5 kg) IBW/kg (Calculated) : 91.4  Temp (24hrs), Avg:98.3 F (36.8 C), Min:98.2 F (36.8 C), Max:98.5 F (36.9 C)  Recent Labs  Lab 07/15/18 1643 07/15/18 1701 07/15/18 2006 07/16/18 0920 07/17/18 0625 07/17/18 0828 07/18/18 0625  WBC 6.9  --   --   --  9.3 6.1 6.9  CREATININE 7.86*  --   --  9.56* 11.79*  --  8.97*  LATICACIDVEN  --  1.94* 1.63  --   --   --   --     Estimated Creatinine Clearance: 14.7 mL/min (A) (by C-G formula based on SCr of 8.97 mg/dL (H)).    Allergies  Allergen Reactions  . Shellfish Allergy Anaphylaxis  . Penicillins Itching and Rash    Has patient had a PCN reaction causing immediate rash, facial/tongue/throat swelling, SOB or lightheadedness with hypotension: Yes Has patient had a PCN reaction causing severe rash involving mucus membranes or skin necrosis: No Has patient had a PCN reaction that required hospitalization: No Has patient had a PCN reaction occurring within the last 10 years: No If all of the above answers are "NO", then may proceed with Cephalosporin use.    Azactam 7/30 x 1 LVQ 7/30 x 1 Vanc 7/30 >> 8/2 CTX 7/31 >>7/31 Cefazolin 8/2 >>  7/30 BCx >> 1/2 MSSE 7/30 Wound cx (I&D) >> GPC  7/31 MRSA PCR >> negative  Thank you for allowing pharmacy to be a part of this patient's  care.  Alycia Rossetti, PharmD, BCPS Clinical Pharmacist Pager: [phone] Clinical phone for 07/18/2018 from 7a-3:30p: [phone] If after 3:30p, please call main pharmacy at: [phone] Please check AMION for all [place] numbers 07/18/2018 10:03 AM

## 2018-07-18 NOTE — Progress Notes (Addendum)
PROGRESS NOTE        PATIENT DETAILS Name: David Walsh Age: 52 y.o. Sex: male Date of Birth: 03/06/1966 Admit Date: 07/15/2018 Admitting Physician Ejiroghene Arlyce Dice, MD [id number]  Brief Narrative: Patient is a 52 y.o. male with history of ESRD on HD TTS, right lower extremity BKA, DM, hypertension, chronic abdominal pain (ongoing for the past 2 years) presented to the ED with abdominal pain and a upper back abscess.  Patient underwent I&D-started on empiric antibiotics and is admitted to the hospitalist service.  1 out of 2 blood cultures was positive for coag negative staph, subsequently tunneled dialysis catheter was removed by IR on 8/2, with plans to replace dialysis catheter either on 8/3 or 8/4.  See below for further details  Subjective: Continues to complain of abdominal pain-mostly in the bilateral flank area.  Claims he vomited last night.  Assessment/Plan: Small upper back abscess: Underwent I&D in the emergency room.  Wound in the upper back looks stable without any significant purulent discharge.  Empiric IV Ancef-previously on vancomycin.  Coag negative staph bacteremia: 1 out of 2 blood cultures positive for gram-negative staph, since patient does have a HD catheter in place-nephrology recommending HD catheter removal, this was subsequently done on 8/2.  Nephrology plans to replace HD catheter in the next day or so.  After discussion with pharmacy-have changed from vancomycin to Ancef.    Abdominal pain: Ongoing for the past 2 years but worsening lately, CT of the abdomen did not show any acute abnormalities.  Patient apparently did have a colonoscopy last year at the [place] system which was reportedly normal.  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  Having daily BMs-on MiraLAX and senna.  ESRD: Defer care to nephrology.  DM-2:  BG still uncontrolled-increase Lantus to 8 units, continue with SSI and follow CBGs.    Hypertension: Controlled-med rec finally completed by pharmacy-resume metoprolol and hydralazine, continue with amlodipine.   Hypothyroidism: Resume levothyroxine  Hx of BKA, right   DVT Prophylaxis: Prophylactic Heparin   Code Status: Full code   Family Communication: None at bedside  Disposition Plan: Remain inpatient-will require a few more days of hospitalization before consideration of discharge.  Antimicrobial agents: Anti-infectives (From admission, onward)   Start     Dose/Rate Route Frequency Ordered Stop   07/18/18 2200  ceFAZolin (ANCEF) IVPB 1 g/50 mL premix     1 g 100 mL/hr over 30 Minutes Intravenous Every 24 hours 07/18/18 1006     07/17/18 1200  vancomycin (VANCOCIN) IVPB 1000 mg/200 mL premix  Status:  Discontinued     1,000 mg 200 mL/hr over 60 Minutes Intravenous Every T-Th-Sa (Hemodialysis) 07/16/18 0129 07/18/18 0911   07/17/18 0849  vancomycin (VANCOCIN) 1-5 GM/200ML-% IVPB    Note to Pharmacy:  Rodell Perna   : cabinet override      07/17/18 0849 07/17/18 1020   07/17/18 0849  vancomycin (VANCOCIN) 1-5 GM/200ML-% IVPB    Note to Pharmacy:  Rodell Perna   : cabinet override      07/17/18 0849 07/17/18 1020   07/16/18 0015  vancomycin (VANCOCIN) IVPB 1000 mg/200 mL premix     1,000 mg 200 mL/hr over 60 Minutes Intravenous  Once 07/16/18 0006 07/16/18 2117   07/15/18 2345  cefTRIAXone (ROCEPHIN) 2 g in sodium chloride 0.9 % 100 mL IVPB  Status:  Discontinued     2 g 200 mL/hr over 30 Minutes Intravenous Daily at bedtime 07/15/18 2329 07/16/18 1552   07/15/18 1630  levofloxacin (LEVAQUIN) IVPB 750 mg     750 mg 100 mL/hr over 90 Minutes Intravenous  Once 07/15/18 1616 07/15/18 1917   07/15/18 1630  aztreonam (AZACTAM) 2 g in sodium chloride 0.9 % 100 mL IVPB     2 g 200 mL/hr over 30 Minutes Intravenous  Once 07/15/18 1616 07/15/18 2019   07/15/18 1630   vancomycin (VANCOCIN) IVPB 1000 mg/200 mL premix     1,000 mg 200 mL/hr over 60 Minutes Intravenous  Once 07/15/18 1616 07/15/18 2204      Procedures: 8/2>> HD catheter removal by IR  CONSULTS:  None  Time spent: 25- minutes-Greater than 50% of this time was spent in counseling, explanation of diagnosis, planning of further management, and coordination of care.  MEDICATIONS: Scheduled Meds: . amLODipine  5 mg Oral Daily  . chlorhexidine      . Chlorhexidine Gluconate Cloth  6 each Topical Q0600  . gabapentin  300 mg Oral QHS  . heparin  5,000 Units Subcutaneous Q8H  . insulin aspart  0-15 Units Subcutaneous TID WC  . insulin glargine  5 Units Subcutaneous QHS  . lidocaine  2 patch Transdermal Q24H  . lidocaine      . multivitamin  1 tablet Oral QHS  . pantoprazole  40 mg Oral Q0600  . polyethylene glycol  17 g Oral Daily  . senna  2 tablet Oral QHS  . sevelamer carbonate  1,600 mg Oral TID WC   Continuous Infusions: .  ceFAZolin (ANCEF) IV     PRN Meds:.acetaminophen **OR** acetaminophen, fentaNYL (SUBLIMAZE) injection, hydrALAZINE, HYDROcodone-acetaminophen, ondansetron **OR** ondansetron (ZOFRAN) IV   PHYSICAL EXAM: Vital signs: Vitals:   07/17/18 2100 07/18/18 0522 07/18/18 1315 07/18/18 1317  BP: (!) 152/83 (!) 180/101 (!) 213/100 (!) 204/101  Pulse: (!) 101 95 96 94  Resp: 18 18    Temp: 98.3 F (36.8 C) 98.3 F (36.8 C) 98.1 F (36.7 C)   TempSrc: Oral Oral Oral   SpO2: 97% 94% 97% 92%  Weight:      Height:       Filed Weights   07/16/18 2047 07/17/18 0735 07/17/18 1218  Weight: 126.5 kg (278 lb 14.1 oz) (!) 136.1 kg (300 lb 0.7 oz) 133.5 kg (294 lb 5 oz)   Body mass index is 34.01 kg/m.   General appearance:Awake, alert, not in any distress.  Eyes:no scleral icterus. HEENT: Atraumatic and Normocephalic Neck: supple, no JVD. Resp:Good air entry bilaterally,no rales or rhonchi CVS: S1 S2 regular, no murmurs.  GI: Bowel sounds present, mildly  tender in the bilateral flank area but without any peritoneal signs.   Extremities: B/L Lower Ext shows no edema, both legs are warm to touch Neurology:  Non focal Psychiatric: Normal judgment and insight. Normal mood. Musculoskeletal:No digital cyanosis Skin:No Rash, warm and dry Wounds:N/A  I have personally reviewed following labs and imaging studies  LABORATORY DATA: CBC: Recent Labs  Lab 07/15/18 1643 07/17/18 0625 07/17/18 0828 07/18/18 0625  WBC 6.9 9.3 6.1 6.9  NEUTROABS 4.8  --   --   --   HGB 12.3* 6.7* 11.3* 11.2*  HCT 36.1* 21.3* 34.9* 35.0*  MCV 87.4 92.6 90.9 91.9  PLT 262 281 219 440    Basic Metabolic Panel: Recent  Labs  Lab 07/15/18 1643 07/16/18 0920 07/16/18 1807 07/17/18 0625 07/18/18 0625  NA 132* 133*  --  134* 135  K 5.0 4.9  --  5.5* 5.3*  CL 95* 94*  --  93* 94*  CO2 25 25  --  22 25  GLUCOSE 339* 258*  --  315* 326*  BUN 43* 50*  --  63* 39*  CREATININE 7.86* 9.56*  --  11.79* 8.97*  CALCIUM 9.1 9.5  --  9.4 9.3  PHOS  --   --  8.0* 9.6* 7.8*    GFR: Estimated Creatinine Clearance: 14.7 mL/min (A) (by C-G formula based on SCr of 8.97 mg/dL (H)).  Liver Function Tests: Recent Labs  Lab 07/15/18 1643 07/17/18 0625 07/18/18 0625  AST 23  --   --   ALT 16  --   --   ALKPHOS 139*  --   --   BILITOT 0.5  --   --   PROT 7.7  --   --   ALBUMIN 3.5 3.4* 3.3*   Recent Labs  Lab 07/15/18 1643  LIPASE 54*   No results for input(s): AMMONIA in the last 168 hours.  Coagulation Profile: No results for input(s): INR, PROTIME in the last 168 hours.  Cardiac Enzymes: No results for input(s): CKTOTAL, CKMB, CKMBINDEX, TROPONINI in the last 168 hours.  BNP (last 3 results) No results for input(s): PROBNP in the last 8760 hours.  HbA1C: No results for input(s): HGBA1C in the last 72 hours.  CBG: Recent Labs  Lab 07/17/18 1320 07/17/18 1723 07/17/18 2058 07/18/18 0810 07/18/18 1144  GLUCAP 191* 247* 220* 329* 286*    Lipid  Profile: No results for input(s): CHOL, HDL, LDLCALC, TRIG, CHOLHDL, LDLDIRECT in the last 72 hours.  Thyroid Function Tests: No results for input(s): TSH, T4TOTAL, FREET4, T3FREE, THYROIDAB in the last 72 hours.  Anemia Panel: No results for input(s): VITAMINB12, FOLATE, FERRITIN, TIBC, IRON, RETICCTPCT in the last 72 hours.  Urine analysis:    Component Value Date/Time   COLORURINE YELLOW 12/15/2010 2309   APPEARANCEUR CLEAR 12/15/2010 2309   LABSPEC 1.021 12/15/2010 2309   PHURINE 6.5 12/15/2010 2309   GLUCOSEU 500 (A) 12/15/2010 2309   HGBUR LARGE (A) 12/15/2010 2309   BILIRUBINUR NEGATIVE 12/15/2010 2309   KETONESUR NEGATIVE 12/15/2010 2309   PROTEINUR >300 (A) 12/15/2010 2309   UROBILINOGEN 0.2 12/15/2010 2309   NITRITE NEGATIVE 12/15/2010 2309   LEUKOCYTESUR NEGATIVE 12/15/2010 2309    Sepsis Labs: Lactic Acid, Venous    Component Value Date/Time   LATICACIDVEN 1.63 07/15/2018 2006    MICROBIOLOGY: Recent Results (from the past 240 hour(s))  Culture, blood (routine x 2)     Status: None (Preliminary result)   Collection Time: 07/15/18  4:43 PM  Result Value Ref Range Status   Specimen Description   Final    BLOOD BLOOD LEFT FOREARM Performed at [facility], [place] [address], [place], [place] [address]    Special Requests   Final    BOTTLES DRAWN AEROBIC AND ANAEROBIC Blood Culture adequate volume Performed at [place] [address], [place], [place] [address]    Culture   Final    NO GROWTH 3 DAYS Performed at [facility], [place] [address] [place], [place] [address]    Report Status PENDING  Incomplete  Culture, blood (routine x 2)     Status: Abnormal (Preliminary result)   Collection Time: 07/15/18  4:47 PM  Result Value Ref Range Status   Specimen Description   Final    BLOOD LEFT ANTECUBITAL Performed at [place] [address], [place], [place] [address]    Special Requests    Final    BOTTLES DRAWN AEROBIC AND ANAEROBIC Blood Culture adequate volume Performed at [place] [address] [place], [place] [address]    Culture  Setup Time   Final    GRAM POSITIVE COCCI IN CLUSTERS ANAEROBIC BOTTLE ONLY CRITICAL RESULT CALLED TO, READ BACK BY AND VERIFIED WITH: Jene Every PharmD 15:15 07/16/18 ([email or web address])    Culture (A)  Final    STAPHYLOCOCCUS SPECIES (COAGULASE NEGATIVE) THE SIGNIFICANCE OF ISOLATING THIS ORGANISM FROM A SINGLE SET OF BLOOD CULTURES WHEN MULTIPLE SETS ARE DRAWN IS UNCERTAIN. PLEASE NOTIFY THE MICROBIOLOGY DEPARTMENT WITHIN ONE WEEK IF SPECIATION AND SENSITIVITIES ARE REQUIRED. Performed at [facility], [place] [address] [place], [place] [address]    Report Status PENDING  Incomplete  Blood Culture ID Panel (Reflexed)     Status: Abnormal   Collection Time: 07/15/18  4:47 PM  Result Value Ref Range Status   Enterococcus species NOT DETECTED NOT DETECTED Final   Listeria monocytogenes NOT DETECTED NOT DETECTED Final   Staphylococcus species DETECTED (A) NOT DETECTED Final    Comment: Methicillin (oxacillin) susceptible coagulase negative staphylococcus. Possible blood culture contaminant (unless isolated from more than one blood culture draw or clinical case suggests pathogenicity). No antibiotic treatment is indicated for blood  culture contaminants. CRITICAL RESULT CALLED TO, READ BACK BY AND VERIFIED WITH: Jene Every PharmD 15:15 07/16/18 ([email or web address])    Staphylococcus aureus NOT DETECTED NOT DETECTED Final   Methicillin resistance NOT DETECTED NOT DETECTED Final   Streptococcus species NOT DETECTED NOT DETECTED Final   Streptococcus agalactiae NOT DETECTED NOT DETECTED Final   Streptococcus pneumoniae NOT DETECTED NOT DETECTED Final   Streptococcus pyogenes NOT DETECTED NOT DETECTED Final   Acinetobacter baumannii NOT DETECTED NOT DETECTED Final   Enterobacteriaceae species NOT DETECTED NOT DETECTED Final   Enterobacter  cloacae complex NOT DETECTED NOT DETECTED Final   Escherichia coli NOT DETECTED NOT DETECTED Final   Klebsiella oxytoca NOT DETECTED NOT DETECTED Final   Klebsiella pneumoniae NOT DETECTED NOT DETECTED Final   Proteus species NOT DETECTED NOT DETECTED Final   Serratia marcescens NOT DETECTED NOT DETECTED Final   Haemophilus influenzae NOT DETECTED NOT DETECTED Final   Neisseria meningitidis NOT DETECTED NOT DETECTED Final   Pseudomonas aeruginosa NOT DETECTED NOT DETECTED Final   Candida albicans NOT DETECTED NOT DETECTED Final   Candida glabrata NOT DETECTED NOT DETECTED Final   Candida krusei NOT DETECTED NOT DETECTED Final   Candida parapsilosis NOT DETECTED NOT DETECTED Final   Candida tropicalis NOT DETECTED NOT DETECTED Final    Comment: Performed at [facility], [address], [place], [place] [address]  Aerobic/Anaerobic Culture (surgical/deep wound)     Status: None (Preliminary result)   Collection Time: 07/15/18  8:53 PM  Result Value Ref Range Status   Specimen Description   Final    WOUND BACK Performed at [place] [address] [place],  [address]    Special Requests   Final    NONE Performed at [facility], [place] [address] [place], [place] [address]    Gram Stain   Final    RARE WBC PRESENT, PREDOMINANTLY PMN RARE GRAM POSITIVE COCCI    Culture   Final  NO GROWTH 2 DAYS NO ANAEROBES ISOLATED; CULTURE IN PROGRESS FOR 5 DAYS Performed at [place] [address], [place], [place] [address]    Report Status PENDING  Incomplete  MRSA PCR Screening     Status: None   Collection Time: 07/16/18  3:20 AM  Result Value Ref Range Status   MRSA by PCR NEGATIVE NEGATIVE Final    Comment:        The GeneXpert MRSA Assay (FDA approved for NASAL specimens only), is one component of a comprehensive MRSA colonization surveillance program. It is not intended to diagnose MRSA infection nor to guide  or monitor treatment for MRSA infections. Performed at [facility], [place] [address], [place], [place] [address]     RADIOLOGY STUDIES/RESULTS: Ct Abdomen Pelvis Wo Contrast  Result Date: 07/15/2018 CLINICAL DATA:  52 y/o M; right lower quadrant abdominal pain radiating to the left flank for 2 months. Reports mass to the right abdomen. EXAM: CT ABDOMEN AND PELVIS WITHOUT CONTRAST TECHNIQUE: Multidetector CT imaging of the abdomen and pelvis was performed following the standard protocol without IV contrast. COMPARISON:  12/16/2010 CT abdomen and pelvis. FINDINGS: Lower chest: Severe coronary artery calcific atherosclerosis and dense mitral annular calcification. Right central venous catheter tip projects over the right atrium. 2-3 mm nodules at the lung bases, some obscured by atelectasis on the prior CT. Hepatobiliary: No focal liver abnormality is seen. No gallstones, gallbladder wall thickening, or biliary dilatation. Pancreas: Unremarkable. No pancreatic ductal dilatation or surrounding inflammatory changes. Spleen: Normal in size without focal abnormality. Adrenals/Urinary Tract: Adrenal glands are unremarkable. Atrophic kidneys bilaterally. Kidneys are otherwise normal, without renal calculi, focal lesion, or hydronephrosis. Bladder is unremarkable. Stomach/Bowel: Stomach is within normal limits. Appendix appears normal. No evidence of bowel wall thickening, distention, or inflammatory changes. Vascular/Lymphatic: Aortic atherosclerosis. No enlarged abdominal or pelvic lymph nodes. Reproductive: Prostate is unremarkable. Other: No abdominal wall hernia or abnormality. No abdominopelvic ascites. Musculoskeletal: New L2 vertebral body subcentimeter lucent foci (series 4, image 90). No acute fracture. Increased density of bones. IMPRESSION: 1. No acute process identified. 2. 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 Differential  includes lytic metastasis or myeloma. No direct connection to the disc space identified to suggest Schmorl's node. Follow-up recommended to ensure stability. 3. Severe coronary and aortic calcific atherosclerosis. Electronically Signed   By: Kristine Garbe M.D.   On: 07/15/2018 17:46   Dg Chest 2 View  Result Date: 07/15/2018 CLINICAL DATA:  Sepsis.  Right lower quadrant pain. EXAM: CHEST - 2 VIEW COMPARISON:  12/16/2010 FINDINGS: There is a right jugular dialysis catheter with the tip extending into the right atrium. Low lung volumes. Left basilar densities are most compatible with atelectasis/scar and may be chronic. Cardiac silhouette appears to be within normal limits and stable but poorly characterized on this low lung volume examination. Negative for pneumothorax. No significant airspace disease or pulmonary edema. No large pleural effusions. IMPRESSION: Low lung volumes without acute findings. Presence of a dialysis catheter. Electronically Signed   By: Markus Daft M.D.   On: 07/15/2018 17:07   Dg Abd 2 Views  Result Date: 07/17/2018 CLINICAL DATA:  Abdominal pain.  LEFT lower quadrant pain. EXAM: ABDOMEN - 2 VIEW COMPARISON:  None. FINDINGS: Suboptimal images due to body habitus. The bowel gas pattern is normal. There is no evidence of free air. No radio-opaque calculi or other significant radiographic abnormality is seen. IMPRESSION: Grossly  negative flat and upright abdominal radiographs. Electronically Signed   By: Staci Righter M.D.   On: 07/17/2018 15:26     LOS: 3 days   Oren Binet, MD  [facility]  If 7PM-7AM, please contact night-coverage  Please page via [email or web address]-click on MD name and type text message  07/18/2018, 1:26 PM

## 2018-07-18 NOTE — Plan of Care (Signed)
  Problem: Health Behavior/Discharge Planning: Goal: Ability to manage health-related needs will improve Outcome: Progressing   Problem: Clinical Measurements: Goal: Ability to maintain clinical measurements within normal limits will improve Outcome: Progressing   

## 2018-07-18 NOTE — Progress Notes (Signed)
Patient brought to IR for tunneled dialysis catheter removal due to bacteremia. Catheter removed without immediate complications, patient tolerated procedure well. Returned to floor following procedure.  Candiss Norse, PA-C

## 2018-07-18 NOTE — Progress Notes (Signed)
Results for RITHIK, ODEA (MRN [id number]) as of 07/18/2018 11:09  Ref. Range 07/16/2018 20:45 07/17/2018 13:20 07/17/2018 17:23 07/17/2018 20:58 07/18/2018 08:10  Glucose-Capillary Latest Ref Range: 70 - 99 mg/dL 276 (H) 191 (H) 247 (H) 220 (H) 329 (H)  Noted that blood sugars continue to be greater than 200 mg/dl.  Recommend increasing Lantus to 12 units daily and continuing Novolog 0-15 units TID.  Titrate insulin dosage as needed. Will continue to monitor blood sugars while in the hospital.  Harvel Ricks RN BSN CDE Diabetes Coordinator Pager: [phone]  8am-5pm

## 2018-07-18 NOTE — Progress Notes (Addendum)
[facility] Progress Note   Dialysis Orders: TTS 4.5 Hr [place] [phone] EDW 279# (127.6) prosthesis weighs 33# =15#  Assessment/Plan: 1. Bacteremia + MSSE-1/2 BC IR to remove catheter today; catheter replace Sat - on Ancef 2. ESRD -HD after catheter replaced - I will contact [facility] for the remainder of HD orders K 5.3 surprisingly high given he had HD yesterday though glu is 326; however, he had been on a heart healthy diet which is high in K - changed to renal carb mod diet- plan for HD Saturday 3. Anemia - hgb 11.2- no meds   4. Secondary hyperparathyroidism - on renvela 5. HTN/volume - net UF 2.6 L post wt 133.5 - if weights are correct - he is sig above EDW BP is up; much different from 7/30 and 7/31 weights 6. Nutrition - change to renal carb mod diet + vits 7. Abscess  Back s/p I and D - on Ancef 8. Abdominal pain - per GI some better after BM 9. DM - tells me his A!C runs 9 - 10, per primary  Myriam Jacobson, PA-C [place] [phone] 07/18/2018,9:23 AM  LOS: 3 days    Patient seen and examined, agree with above note with above modifications. No new c/o's- for [facility] removal today ? But some confusion regarding when with replacement also confusion- HD for tomorrow- 2 K and volume removal as able given generous BP and supposedly above EDW  Corliss Parish, MD 07/18/2018      Subjective:   Had BM.  Still some abdominal pain.  Very talkative. Multiple questions. HD yest- removed 2600  Objective Vitals:   07/17/18 1257 07/17/18 1727 07/17/18 2100 07/18/18 0522  BP: (!) 155/83 (!) 169/89 (!) 152/83 (!) 180/101  Pulse: 97 97 (!) 101 95  Resp: 18 18 18 18   Temp: 98.2 F (36.8 C) 98.2 F (36.8 C) 98.3 F (36.8 C) 98.3 F (36.8 C)  TempSrc: Oral Oral Oral Oral  SpO2: 92% 92% 97% 94%  Weight:      Height:       Physical Exam General: NAD sitting in chair talkative Heart: RRR Lungs:  No rales Back: dressing upper central  back Abdomen: obese some distension and right sided tenderness and left sided tenderness + BS Extremities: LLE tr edema, right LE prosthesis in place Dialysis Access:  Right IJ [facility]   Additional Objective Labs: Basic Metabolic Panel: Recent Labs  Lab 07/16/18 0920 07/16/18 1807 07/17/18 0625 07/18/18 0625  NA 133*  --  134* 135  K 4.9  --  5.5* 5.3*  CL 94*  --  93* 94*  CO2 25  --  22 25  GLUCOSE 258*  --  315* 326*  BUN 50*  --  63* 39*  CREATININE 9.56*  --  11.79* 8.97*  CALCIUM 9.5  --  9.4 9.3  PHOS  --  8.0* 9.6* 7.8*   Liver Function Tests: Recent Labs  Lab 07/15/18 1643 07/17/18 0625 07/18/18 0625  AST 23  --   --   ALT 16  --   --   ALKPHOS 139*  --   --   BILITOT 0.5  --   --   PROT 7.7  --   --   ALBUMIN 3.5 3.4* 3.3*   Recent Labs  Lab 07/15/18 1643  LIPASE 54*   CBC: Recent Labs  Lab 07/15/18 1643 07/17/18 0625 07/17/18 0828 07/18/18 0625  WBC 6.9 9.3 6.1 6.9  NEUTROABS 4.8  --   --   --  HGB 12.3* 6.7* 11.3* 11.2*  HCT 36.1* 21.3* 34.9* 35.0*  MCV 87.4 92.6 90.9 91.9  PLT 262 281 219 192   Blood Culture    Component Value Date/Time   SDES  07/15/2018 2053    WOUND BACK Performed at [facility], [place] [address] [place], [place] [address]    SPECREQUEST  07/15/2018 2053    NONE Performed at [facility], [place] [address], [place], [place] [address]    CULT  07/15/2018 2053    NO GROWTH 1 DAY Performed at [place] [address] [place], [place] [address]    REPTSTATUS PENDING 07/15/2018 2053    Cardiac Enzymes: No results for input(s): CKTOTAL, CKMB, CKMBINDEX, TROPONINI in the last 168 hours. CBG: Recent Labs  Lab 07/16/18 2045 07/17/18 1320 07/17/18 1723 07/17/18 2058 07/18/18 0810  GLUCAP 276* 191* 247* 220* 329*   Iron Studies: No results for input(s): IRON, TIBC, TRANSFERRIN, FERRITIN in the last 72 hours. No results found for: INR, PROTIME Studies/Results: Dg  Abd 2 Views  Result Date: 07/17/2018 CLINICAL DATA:  Abdominal pain.  LEFT lower quadrant pain. EXAM: ABDOMEN - 2 VIEW COMPARISON:  None. FINDINGS: Suboptimal images due to body habitus. The bowel gas pattern is normal. There is no evidence of free air. No radio-opaque calculi or other significant radiographic abnormality is seen. IMPRESSION: Grossly negative flat and upright abdominal radiographs. Electronically Signed   By: Staci Righter M.D.   On: 07/17/2018 15:26   Medications:  . amLODipine  5 mg Oral Daily  . Chlorhexidine Gluconate Cloth  6 each Topical Q0600  . heparin  5,000 Units Subcutaneous Q8H  . insulin aspart  0-15 Units Subcutaneous TID WC  . insulin glargine  5 Units Subcutaneous QHS  . pantoprazole  40 mg Oral Q0600  . polyethylene glycol  17 g Oral Daily  . senna  2 tablet Oral QHS  . sevelamer carbonate  1,600 mg Oral TID WC

## 2018-07-18 NOTE — Progress Notes (Addendum)
Daily Rounding Note  07/18/2018, 9:06 AM  LOS: 3 days   SUBJECTIVE:   Chief complaint:  Abdominal pain. Continues.   No N/V this AM     OBJECTIVE:         Vital signs in last 24 hours:    Temp:  [98.2 F (36.8 C)-98.5 F (36.9 C)] 98.3 F (36.8 C) (08/02 0522) Pulse Rate:  [87-102] 95 (08/02 0522) Resp:  [18-23] 18 (08/02 0522) BP: (112-180)/(46-101) 180/101 (08/02 0522) SpO2:  [92 %-98 %] 94 % (08/02 0522) Weight:  [294 lb 5 oz (133.5 kg)] 294 lb 5 oz (133.5 kg) (08/01 1218) Last BM Date: 08/16/18 Filed Weights   07/16/18 2047 07/17/18 0735 07/17/18 1218  Weight: 278 lb 14.1 oz (126.5 kg) (!) 300 lb 0.7 oz (136.1 kg) 294 lb 5 oz (133.5 kg)   General: obese, not ill or uncomfortable looking   Spoke with pt, did not reexamine   Intake/Output from previous day: 08/01 0701 - 08/02 0700 In: 320 [P.O.:120; IV Piggyback:200] Out: 2600   Intake/Output this shift: Total I/O In: 220 [P.O.:220] Out: -   Lab Results: Recent Labs    07/17/18 0625 07/17/18 0828 07/18/18 0625  WBC 9.3 6.1 6.9  HGB 6.7* 11.3* 11.2*  HCT 21.3* 34.9* 35.0*  PLT 281 219 192   BMET Recent Labs    07/16/18 0920 07/17/18 0625 07/18/18 0625  NA 133* 134* 135  K 4.9 5.5* 5.3*  CL 94* 93* 94*  CO2 25 22 25   GLUCOSE 258* 315* 326*  BUN 50* 63* 39*  CREATININE 9.56* 11.79* 8.97*  CALCIUM 9.5 9.4 9.3   LFT Recent Labs    07/15/18 1643 07/17/18 0625 07/18/18 0625  PROT 7.7  --   --   ALBUMIN 3.5 3.4* 3.3*  AST 23  --   --   ALT 16  --   --   ALKPHOS 139*  --   --   BILITOT 0.5  --   --    PT/INR No results for input(s): LABPROT, INR in the last 72 hours. Hepatitis Panel Recent Labs    07/17/18 0847  HEPBSAG Negative    Studies/Results: Dg Abd 2 Views  Result Date: 07/17/2018 CLINICAL DATA:  Abdominal pain.  LEFT lower quadrant pain. EXAM: ABDOMEN - 2 VIEW COMPARISON:  None. FINDINGS: Suboptimal images due to  body habitus. The bowel gas pattern is normal. There is no evidence of free air. No radio-opaque calculi or other significant radiographic abnormality is seen. IMPRESSION: Grossly negative flat and upright abdominal radiographs. Electronically Signed   By: Staci Righter M.D.   On: 07/17/2018 15:26   Scheduled Meds: . amLODipine  5 mg Oral Daily  . Chlorhexidine Gluconate Cloth  6 each Topical Q0600  . heparin  5,000 Units Subcutaneous Q8H  . insulin aspart  0-15 Units Subcutaneous TID WC  . insulin glargine  5 Units Subcutaneous QHS  . pantoprazole  40 mg Oral Q0600  . polyethylene glycol  17 g Oral Daily  . senna  2 tablet Oral QHS  . sevelamer carbonate  1,600 mg Oral TID WC   Continuous Infusions: . vancomycin 1,000 mg (07/17/18 1058)   PRN Meds:.acetaminophen **OR** acetaminophen, fentaNYL (SUBLIMAZE) injection, hydrALAZINE, HYDROcodone-acetaminophen, ondansetron **OR** ondansetron (ZOFRAN) IV   ASSESMENT:   *     Chronic abdominal pain.  Previous CT scans, colonoscopy in 2018 unrevealing as to cause.  Acute nonbloody emesis. Suspect neuropathic pain.  Dr Havery Moros suggested trial of lidocaine patch and starting him on gabapentin or cymbalta / TCA  *    IDDM.  Does not appear well-controlled.  *     ESRD.  Has been on hemodialysis for 5 years, previously on peritoneal dialysis for a couple of years before that.  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 Followed by [facility] renal transplant team.  Transplant being postponed until patient is able to get his glucose under better control and he is closer to the goal weight of 250 #  *   Upper back abscess, s/p I&D.  Coag negative staph on blood clx PCR.  On Vancomycin IV.      PLAN   *   Ordered Lidocaine patches (1 to RLQ, 1 to LLQ), Gabapentin (low dose to start).  Holding off on Cymbalta as med caution/contridication if Cr clearance < 30.  If needed, amitriptyline may be  safer choice.  Also given how "head shy" pt is, better to work in 1 new med at a time.      Azucena Freed  07/18/2018, 9:06 AM Phone [phone]

## 2018-07-19 ENCOUNTER — Inpatient Hospital Stay (HOSPITAL_COMMUNITY): Payer: Medicare Other

## 2018-07-19 ENCOUNTER — Encounter (HOSPITAL_COMMUNITY): Payer: Self-pay | Admitting: Interventional Radiology

## 2018-07-19 HISTORY — PX: IR FLUORO GUIDE CV LINE LEFT: IMG2282

## 2018-07-19 HISTORY — PX: IR US GUIDE VASC ACCESS LEFT: IMG2389

## 2018-07-19 LAB — RENAL FUNCTION PANEL
Albumin: 3.3 g/dL — ABNORMAL LOW (ref 3.5–5.0)
Anion gap: 15 (ref 5–15)
BUN: 52 mg/dL — ABNORMAL HIGH (ref 6–20)
CALCIUM: 9.4 mg/dL (ref 8.9–10.3)
CO2: 26 mmol/L (ref 22–32)
CREATININE: 10.81 mg/dL — AB (ref 0.61–1.24)
Chloride: 94 mmol/L — ABNORMAL LOW (ref 98–111)
GFR, EST AFRICAN AMERICAN: 6 mL/min — AB (ref >60)
GFR, EST NON AFRICAN AMERICAN: 5 mL/min — AB (ref >60)
Glucose, Bld: 309 mg/dL — ABNORMAL HIGH (ref 70–99)
Phosphorus: 9.3 mg/dL — ABNORMAL HIGH (ref 2.5–4.6)
Potassium: 5.3 mmol/L — ABNORMAL HIGH (ref 3.5–5.1)
SODIUM: 135 mmol/L (ref 135–145)

## 2018-07-19 LAB — GLUCOSE, CAPILLARY
GLUCOSE-CAPILLARY: 283 mg/dL — AB (ref 70–99)
GLUCOSE-CAPILLARY: 300 mg/dL — AB (ref 70–99)
GLUCOSE-CAPILLARY: 274 mg/dL — AB (ref 70–99)
Glucose-Capillary: 263 mg/dL — ABNORMAL HIGH (ref 70–99)

## 2018-07-19 LAB — CULTURE, BLOOD (ROUTINE X 2): SPECIAL REQUESTS: ADEQUATE

## 2018-07-19 MED ORDER — LIDOCAINE HCL 1 % IJ SOLN
INTRAMUSCULAR | Status: AC
  Filled 2018-07-19: qty 20

## 2018-07-19 MED ORDER — HEPARIN SODIUM (PORCINE) 1000 UNIT/ML IJ SOLN
INTRAMUSCULAR | Status: AC
  Administered 2018-07-19: 10:00:00
  Filled 2018-07-19: qty 1

## 2018-07-19 MED ORDER — MIDAZOLAM HCL 2 MG/2ML IJ SOLN
INTRAMUSCULAR | Status: AC
  Filled 2018-07-19: qty 4

## 2018-07-19 MED ORDER — ONDANSETRON HCL 4 MG/2ML IJ SOLN
4.0000 mg | Freq: Once | INTRAMUSCULAR | Status: DC
  Filled 2018-07-19: qty 2

## 2018-07-19 MED ORDER — ONDANSETRON HCL 4 MG/2ML IJ SOLN
INTRAMUSCULAR | Status: AC
  Filled 2018-07-19: qty 2

## 2018-07-19 MED ORDER — FENTANYL CITRATE (PF) 100 MCG/2ML IJ SOLN
INTRAMUSCULAR | Status: AC | PRN
  Administered 2018-07-19 (×2): 50 ug via INTRAVENOUS

## 2018-07-19 MED ORDER — GELATIN ABSORBABLE 12-7 MM EX MISC
CUTANEOUS | Status: AC
  Filled 2018-07-19: qty 1

## 2018-07-19 MED ORDER — ONDANSETRON HCL 4 MG/2ML IJ SOLN
INTRAMUSCULAR | Status: AC | PRN
  Administered 2018-07-19: 4 mg via INTRAVENOUS

## 2018-07-19 MED ORDER — MIDAZOLAM HCL 2 MG/2ML IJ SOLN
INTRAMUSCULAR | Status: AC | PRN
  Administered 2018-07-19: 2 mg via INTRAVENOUS

## 2018-07-19 MED ORDER — CEFAZOLIN SODIUM-DEXTROSE 2-4 GM/100ML-% IV SOLN
INTRAVENOUS | Status: AC
  Administered 2018-07-19: 2000 mg
  Filled 2018-07-19: qty 100

## 2018-07-19 MED ORDER — FENTANYL CITRATE (PF) 100 MCG/2ML IJ SOLN
INTRAMUSCULAR | Status: AC
  Filled 2018-07-19: qty 2

## 2018-07-19 MED ORDER — LIDOCAINE HCL (PF) 1 % IJ SOLN
INTRAMUSCULAR | Status: AC | PRN
  Administered 2018-07-19: 10 mL

## 2018-07-19 MED ORDER — HEPARIN SODIUM (PORCINE) 5000 UNIT/ML IJ SOLN
5000.0000 [IU] | Freq: Three times a day (TID) | INTRAMUSCULAR | Status: DC
  Administered 2018-07-19 – 2018-07-23 (×9): 5000 [IU] via SUBCUTANEOUS
  Filled 2018-07-19 (×9): qty 1

## 2018-07-19 NOTE — Procedures (Signed)
Interventional Radiology Procedure Note  Procedure: Left IJ tunneled HD catheter (Palindrome) placement.  Catheter tips in the upper RA.   Complications: None  Estimated Blood Loss: None  Recommendations: - Catheter ready for use.  - Routine line care.   Signed,  Criselda Peaches, MD

## 2018-07-19 NOTE — Progress Notes (Addendum)
[facility] Progress Note   Dialysis Orders: TTS 4.5 Hr [place] [phone] EDW 279# (127.6) prosthesis weighs 33# =15#  Assessment/Plan: 1. Bacteremia + MSSE-1/2 BC IR removed catheter 8/2 for replacement today - on Ancef - cath tip not sent for culture that I can see 2. ESRD -HD after catheter replaced  3. Anemia - hgb 11.2- no meds   4. Secondary hyperparathyroidism - on renvela 5. HTN/volume - net UF 2.6 L 8/1 post wt 133.5 - if weights are correct - he is sig above EDW- will see what pre HD weights are today 6. Nutrition - change to renal carb mod diet + vits 7. Abscess  Back s/p I and D - on Ancef gram + cocci 8. Abdominal pain - per GI some better after BM 9. DM - tells me his A1C runs 9 - 10, per primary  Myriam Jacobson, PA-C [place] [phone] 07/19/2018,9:22 AM  LOS: 4 days     Subjective:  Leaving for IR shortly  Objective Vitals:   07/18/18 1317 07/18/18 1656 07/18/18 2141 07/19/18 0550  BP: (!) 204/101 (!) 171/102 (!) 183/103 (!) 169/92  Pulse: 94 (!) 106 83 79  Resp:  18 18 16   Temp:  98.9 F (37.2 C) 98.3 F (36.8 C) 98.9 F (37.2 C)  TempSrc:  Oral Oral Oral  SpO2: 92% 96% 98% 95%  Weight:      Height:       Physical Exam General: NAD sitting in chair talkative putting on prosthesis Heart: RRR Lungs:  No rales Back: dressing upper central back Abdomen: obese some distension and right sided tenderness and left sided tenderness + BS Extremities: LLE tr edema, right LE prosthesis in place Dialysis Access:  none   Additional Objective Labs: Basic Metabolic Panel: Recent Labs  Lab 07/17/18 0625 07/18/18 0625 07/19/18 0415  NA 134* 135 135  K 5.5* 5.3* 5.3*  CL 93* 94* 94*  CO2 22 25 26   GLUCOSE 315* 326* 309*  BUN 63* 39* 52*  CREATININE 11.79* 8.97* 10.81*  CALCIUM 9.4 9.3 9.4  PHOS 9.6* 7.8* 9.3*   Liver Function Tests: Recent Labs  Lab 07/15/18 1643 07/17/18 0625 07/18/18 0625  07/19/18 0415  AST 23  --   --   --   ALT 16  --   --   --   ALKPHOS 139*  --   --   --   BILITOT 0.5  --   --   --   PROT 7.7  --   --   --   ALBUMIN 3.5 3.4* 3.3* 3.3*   Recent Labs  Lab 07/15/18 1643  LIPASE 54*   CBC: Recent Labs  Lab 07/15/18 1643 07/17/18 0625 07/17/18 0828 07/18/18 0625  WBC 6.9 9.3 6.1 6.9  NEUTROABS 4.8  --   --   --   HGB 12.3* 6.7* 11.3* 11.2*  HCT 36.1* 21.3* 34.9* 35.0*  MCV 87.4 92.6 90.9 91.9  PLT 262 281 219 192   Blood Culture    Component Value Date/Time   SDES  07/15/2018 2053    WOUND BACK Performed at [facility], [place] [address], [place], [place] [address]    SPECREQUEST  07/15/2018 2053    NONE Performed at [facility], [place] [address], [place], [place] [address]    CULT  07/15/2018 2053    NO GROWTH 2 DAYS NO ANAEROBES ISOLATED; CULTURE IN PROGRESS FOR 5 DAYS Performed at  [facility], [place] [address], [place], [place] [address]    REPTSTATUS PENDING 07/15/2018 2053    Cardiac Enzymes: No results for input(s): CKTOTAL, CKMB, CKMBINDEX, TROPONINI in the last 168 hours. CBG: Recent Labs  Lab 07/18/18 0810 07/18/18 1144 07/18/18 1630 07/18/18 2130 07/19/18 0735  GLUCAP 329* 286* 322* 216* 283*   Iron Studies: No results for input(s): IRON, TIBC, TRANSFERRIN, FERRITIN in the last 72 hours. Lab Results  Component Value Date   INR 1.12 07/18/2018   Studies/Results: Ir Removal Tun Cv Cath W/o Fl  Result Date: 07/18/2018 INDICATION: Request for removal of tunneled HD catheter which was placed approximately 3 years ago at [facility] hospital in [place]. Patient admitted to [facility] for bacteremia - request to remove tunneled HD catheter today as possible infectious source with plans to replace tomorrow. EXAM: REMOVAL TUNNELED CENTRAL VENOUS CATHETER MEDICATIONS: 10 mL 2% lidocaine. ANESTHESIA/SEDATION: None. FLUOROSCOPY TIME:  None. COMPLICATIONS: None immediate. PROCEDURE: Informed written  consent was obtained from the patient after a thorough discussion of the procedural risks, benefits and alternatives. All questions were addressed. Maximal Sterile Barrier Technique was utilized including mask, sterile gloves, sterile drape, hand hygiene and skin antiseptic. A timeout was performed prior to the initiation of the procedure. The patient's right chest and catheter was prepped and draped in a normal sterile fashion. Heparin was removed from both ports of catheter. 1% lidocaine was used for local anesthesia. Using gentle blunt dissection the cuff of the catheter was exposed and the catheter was removed in it's entirety. Pressure was held until hemostasis was obtained. A sterile dressing was applied. The patient tolerated the procedure well with no immediate complications. IMPRESSION: Successful catheter removal as described above. Read by Candiss Norse, PA-C Electronically Signed   By: Jacqulynn Cadet M.D.   On: 07/18/2018 14:32   Dg Abd 2 Views  Result Date: 07/17/2018 CLINICAL DATA:  Abdominal pain.  LEFT lower quadrant pain. EXAM: ABDOMEN - 2 VIEW COMPARISON:  None. FINDINGS: Suboptimal images due to body habitus. The bowel gas pattern is normal. There is no evidence of free air. No radio-opaque calculi or other significant radiographic abnormality is seen. IMPRESSION: Grossly negative flat and upright abdominal radiographs. Electronically Signed   By: Staci Righter M.D.   On: 07/17/2018 15:26   Medications: .  ceFAZolin (ANCEF) IV 1 g (07/18/18 2150)  . vancomycin     . amLODipine  5 mg Oral Daily  . Chlorhexidine Gluconate Cloth  6 each Topical Q0600  . gabapentin  300 mg Oral QHS  . hydrALAZINE  50 mg Oral Q8H  . insulin aspart  0-15 Units Subcutaneous TID WC  . insulin glargine  8 Units Subcutaneous QHS  . levothyroxine  25 mcg Oral QAC breakfast  . lidocaine  2 patch Transdermal Q24H  . metoprolol tartrate  100 mg Oral BID  . multivitamin  1 tablet Oral QHS  .  pantoprazole  40 mg Oral Q0600  . polyethylene glycol  17 g Oral Daily  . senna  2 tablet Oral QHS  . sevelamer carbonate  1,600 mg Oral TID WC    I personally evaluated the patient and agree with the documentation in note detailed above.  Jannifer Hick MD

## 2018-07-19 NOTE — Progress Notes (Signed)
Patient trmt initiated via new left IJ tunneled perm cath.  Patient refusing ordered UF goal 3-4 kg.  Attempted to explain to patient that he is 10 kg over EDW via bed scale.  Also explained that goal to include flushes d/t no heparin for this trmt.  Patient adamant that he speak to MD.  Dr. Johnney Ou notified of above.  She spoke to patient and revised goal.

## 2018-07-19 NOTE — Procedures (Addendum)
Patient seen on HD and the procedure was supervised.  New TDC running well.  UF 4L - he states the maximum he can tolerated is 4L (total UF).  We will set UF for 4L although per bed weight is more than 4kg > EDW.   Jannifer Hick MD

## 2018-07-19 NOTE — Progress Notes (Signed)
PROGRESS NOTE        PATIENT DETAILS Name: David Walsh Age: 52 y.o. Sex: male Date of Birth: Sep 26, 1966 Admit Date: 07/15/2018 Admitting Physician Ejiroghene Arlyce Dice, MD [id number]  Brief Narrative: Patient is a 52 y.o. male with history of ESRD on HD TTS, right lower extremity BKA, DM, hypertension, chronic abdominal pain (ongoing for the past 2 years) presented to the ED with abdominal pain and a upper back abscess.  Patient underwent I&D-started on empiric antibiotics and is admitted to the hospitalist service.  1 out of 2 blood cultures was positive for coag negative staph, subsequently tunneled dialysis catheter was removed by IR on 8/2, with plans to replace dialysis catheter either on 8/3 or 8/4.  See below for further details  Subjective: Patient in bed, appears comfortable, denies any headache, no fever, no chest pain or pressure, no shortness of breath , does have chronic more than 90-year-old generalized abdominal pain. No focal weakness.  Assessment/Plan:  Small upper back abscess: Underwent I&D in the emergency room.  Wound in the upper back looks stable without any significant purulent discharge.  Empiric IV Ancef-previously on vancomycin.  Coag negative staph bacteremia: 1 out of 2 blood cultures positive for gram-negative staph, since patient does have a HD catheter in place-nephrology recommending HD catheter removal, this was subsequently done on 8/2.  Tunneled catheter was replaced by IR on 07/19/2018, discussed the case with nephrologist Dr. Moshe Cipro and also ID physician Dr. Drucilla Schmidt by me on 07/19/2018, patient will require total of 2 more weeks of IV Ancef, this will have to be communicated to [facility] nephrology service prior to discharge through social work/ case management.  Abdominal pain: Ongoing for the past 2 years but worsening lately, CT of the abdomen did not show any acute abnormalities.  Patient apparently did have a colonoscopy  last year at the [place] system which was reportedly normal.  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  Having daily BMs-on MiraLAX and senna.  [facility]  ESRD: Defer care to nephrology.  DM-2: on Lantus and sliding scale will monitor and adjust.  CBG (last 3)  Recent Labs    07/18/18 2130 07/19/18 0735 07/19/18 1151  GLUCAP 216* 283* 300*     Hypertension: Controlled-med rec finally completed by pharmacy-resume metoprolol and hydralazine, continue with amlodipine.   Hypothyroidism: Resume levothyroxine  Hx of BKA, right   DVT Prophylaxis: Prophylactic Heparin   Code Status: Full code   Family Communication: None at bedside  Disposition Plan: Remain inpatient-will require a few more days of hospitalization before consideration of discharge.  Antimicrobial agents: Anti-infectives (From admission, onward)   Start     Dose/Rate Route Frequency Ordered Stop   07/19/18 0933  ceFAZolin (ANCEF) 2-4 GM/100ML-% IVPB    Note to Pharmacy:  Hilma Favors   : cabinet override      07/19/18 0933 07/19/18 0940   07/18/18 2200  ceFAZolin (ANCEF) IVPB 1 g/50 mL premix     1 g 100 mL/hr over 30 Minutes Intravenous Every 24 hours 07/18/18 1006     07/18/18 1445  vancomycin (VANCOCIN) IVPB 1000 mg/200 mL premix     1,000 mg 200 mL/hr over 60 Minutes Intravenous On call 07/18/18 1437 07/19/18 1445  07/17/18 1200  vancomycin (VANCOCIN) IVPB 1000 mg/200 mL premix  Status:  Discontinued     1,000 mg 200 mL/hr over 60 Minutes Intravenous Every T-Th-Sa (Hemodialysis) 07/16/18 0129 07/18/18 0911   07/17/18 0849  vancomycin (VANCOCIN) 1-5 GM/200ML-% IVPB    Note to Pharmacy:  Rodell Perna   : cabinet override      07/17/18 0849 07/17/18 1020   07/17/18 0849  vancomycin (VANCOCIN) 1-5 GM/200ML-% IVPB    Note to Pharmacy:  Rodell Perna   : cabinet  override      07/17/18 0849 07/17/18 1020   07/16/18 0015  vancomycin (VANCOCIN) IVPB 1000 mg/200 mL premix     1,000 mg 200 mL/hr over 60 Minutes Intravenous  Once 07/16/18 0006 07/16/18 2117   07/15/18 2345  cefTRIAXone (ROCEPHIN) 2 g in sodium chloride 0.9 % 100 mL IVPB  Status:  Discontinued     2 g 200 mL/hr over 30 Minutes Intravenous Daily at bedtime 07/15/18 2329 07/16/18 1552   07/15/18 1630  levofloxacin (LEVAQUIN) IVPB 750 mg     750 mg 100 mL/hr over 90 Minutes Intravenous  Once 07/15/18 1616 07/15/18 1917   07/15/18 1630  aztreonam (AZACTAM) 2 g in sodium chloride 0.9 % 100 mL IVPB     2 g 200 mL/hr over 30 Minutes Intravenous  Once 07/15/18 1616 07/15/18 2019   07/15/18 1630  vancomycin (VANCOCIN) IVPB 1000 mg/200 mL premix     1,000 mg 200 mL/hr over 60 Minutes Intravenous  Once 07/15/18 1616 07/15/18 2204      Procedures: 8/2>> HD catheter removal by IR 07/19/2018.  Tunneled catheter replaced by IR  CONSULTS: Nephrology, GI, ID over the phone Dr. Drucilla Schmidt.  Time spent: 25- minutes-Greater than 50% of this time was spent in counseling, explanation of diagnosis, planning of further management, and coordination of care.  MEDICATIONS: Scheduled Meds: . amLODipine  5 mg Oral Daily  . Chlorhexidine Gluconate Cloth  6 each Topical Q0600  . fentaNYL      . gabapentin  300 mg Oral QHS  . gelatin adsorbable      . heparin  5,000 Units Subcutaneous Q8H  . hydrALAZINE  50 mg Oral Q8H  . insulin aspart  0-15 Units Subcutaneous TID WC  . insulin glargine  8 Units Subcutaneous QHS  . levothyroxine  25 mcg Oral QAC breakfast  . lidocaine  2 patch Transdermal Q24H  . lidocaine      . metoprolol tartrate  100 mg Oral BID  . midazolam      . multivitamin  1 tablet Oral QHS  . ondansetron      . ondansetron (ZOFRAN) IV  4 mg Intravenous Once  . pantoprazole  40 mg Oral Q0600  . polyethylene glycol  17 g Oral Daily  . senna  2 tablet Oral QHS  . sevelamer carbonate  1,600  mg Oral TID WC   Continuous Infusions: .  ceFAZolin (ANCEF) IV 1 g (07/18/18 2150)  . vancomycin     PRN Meds:.acetaminophen **OR** acetaminophen, fentaNYL (SUBLIMAZE) injection, hydrALAZINE, HYDROcodone-acetaminophen, ondansetron **OR** ondansetron (ZOFRAN) IV   PHYSICAL EXAM: Vital signs: Vitals:   07/19/18 1008 07/19/18 1010 07/19/18 1015 07/19/18 1123  BP: (!) 179/98 (!) 193/107 (!) 203/105 136/67  Pulse: 77 77 78 76  Resp: _0 Temp:    97.8 F (36.6 C)  TempSrc:    Oral  SpO2: 92% 90% 100% 92%  Weight:      Height:  Filed Weights   07/16/18 2047 07/17/18 0735 07/17/18 1218  Weight: 126.5 kg (278 lb 14.1 oz) (!) 136.1 kg (300 lb 0.7 oz) 133.5 kg (294 lb 5 oz)   Body mass index is 34.01 kg/m.   Awake Alert, Oriented X 3, No new F.N deficits, Normal affect [place].AT,PERRAL Supple Neck,No JVD, No cervical lymphadenopathy appriciated.  Symmetrical Chest wall movement, Good air movement bilaterally, CTAB RRR,No Gallops, Rubs or new Murmurs, No Parasternal Heave +ve B.Sounds, Abd Soft, No tenderness, No organomegaly appriciated, No rebound - guarding or rigidity. No Cyanosis, Clubbing or edema, No new Rash or bruise  I have personally reviewed following labs and imaging studies  LABORATORY DATA: CBC: Recent Labs  Lab 07/15/18 1643 07/17/18 0625 07/17/18 0828 07/18/18 0625  WBC 6.9 9.3 6.1 6.9  NEUTROABS 4.8  --   --   --   HGB 12.3* 6.7* 11.3* 11.2*  HCT 36.1* 21.3* 34.9* 35.0*  MCV 87.4 92.6 90.9 91.9  PLT 262 281 219 409    Basic Metabolic Panel: Recent Labs  Lab 07/15/18 1643 07/16/18 0920 07/16/18 1807 07/17/18 0625 07/18/18 0625 07/19/18 0415  NA 132* 133*  --  134* 135 135  K 5.0 4.9  --  5.5* 5.3* 5.3*  CL 95* 94*  --  93* 94* 94*  CO2 25 25  --  _0 GLUCOSE 339* 258*  --  315* 326* 309*  BUN 43* 50*  --  63* 39* 52*  CREATININE 7.86* 9.56*  --  11.79* 8.97* 10.81*  CALCIUM 9.1 9.5  --  9.4 9.3 9.4  PHOS  --   --  8.0* 9.6*  7.8* 9.3*    GFR: Estimated Creatinine Clearance: 12.2 mL/min (A) (by C-G formula based on SCr of 10.81 mg/dL (H)).  Liver Function Tests: Recent Labs  Lab 07/15/18 1643 07/17/18 0625 07/18/18 0625 07/19/18 0415  AST 23  --   --   --   ALT 16  --   --   --   ALKPHOS 139*  --   --   --   BILITOT 0.5  --   --   --   PROT 7.7  --   --   --   ALBUMIN 3.5 3.4* 3.3* 3.3*   Recent Labs  Lab 07/15/18 1643  LIPASE 54*   No results for input(s): AMMONIA in the last 168 hours.  Coagulation Profile: Recent Labs  Lab 07/18/18 1505  INR 1.12    Cardiac Enzymes: No results for input(s): CKTOTAL, CKMB, CKMBINDEX, TROPONINI in the last 168 hours.  BNP (last 3 results) No results for input(s): PROBNP in the last 8760 hours.  HbA1C: No results for input(s): HGBA1C in the last 72 hours.  CBG: Recent Labs  Lab 07/18/18 1144 07/18/18 1630 07/18/18 2130 07/19/18 0735 07/19/18 1151  GLUCAP 286* 322* 216* 283* 300*    Lipid Profile: No results for input(s): CHOL, HDL, LDLCALC, TRIG, CHOLHDL, LDLDIRECT in the last 72 hours.  Thyroid Function Tests: No results for input(s): TSH, T4TOTAL, FREET4, T3FREE, THYROIDAB in the last 72 hours.  Anemia Panel: No results for input(s): VITAMINB12, FOLATE, FERRITIN, TIBC, IRON, RETICCTPCT in the last 72 hours.  Urine analysis:    Component Value Date/Time   COLORURINE YELLOW 12/15/2010 2309   APPEARANCEUR CLEAR 12/15/2010 2309   LABSPEC 1.021 12/15/2010 2309   PHURINE 6.5 12/15/2010 2309   GLUCOSEU 500 (A) 12/15/2010 2309   HGBUR LARGE (A) 12/15/2010 2309   BILIRUBINUR NEGATIVE 12/15/2010  [place] 12/15/2010 2309   PROTEINUR >300 (A) 12/15/2010 2309   UROBILINOGEN 0.2 12/15/2010 2309   NITRITE NEGATIVE 12/15/2010 2309   LEUKOCYTESUR NEGATIVE 12/15/2010 2309    Sepsis Labs: Lactic Acid, Venous    Component Value Date/Time   LATICACIDVEN 1.63 07/15/2018 2006    MICROBIOLOGY: Recent Results (from the past  240 hour(s))  Culture, blood (routine x 2)     Status: None (Preliminary result)   Collection Time: 07/15/18  4:43 PM  Result Value Ref Range Status   Specimen Description   Final    BLOOD BLOOD LEFT FOREARM Performed at [facility], [place] [address] [place], [place] [address]    Special Requests   Final    BOTTLES DRAWN AEROBIC AND ANAEROBIC Blood Culture adequate volume Performed at [facility] [address], [place], [place] [address]    Culture  Setup Time   Final    AEROBIC BOTTLE ONLY GRAM POSITIVE RODS CRITICAL RESULT CALLED TO, READ BACK BY AND VERIFIED WITH: Karsten Ro [facility] 07/19/18 6195 JDW Performed at [facility], [place] [address], [place], [place] [address]    Culture GRAM POSITIVE RODS  Final   Report Status PENDING  Incomplete  Culture, blood (routine x 2)     Status: Abnormal   Collection Time: 07/15/18  4:47 PM  Result Value Ref Range Status   Specimen Description   Final    BLOOD LEFT ANTECUBITAL Performed at [facility] [address] [place], [place] [address]    Special Requests   Final    BOTTLES DRAWN AEROBIC AND ANAEROBIC Blood Culture adequate volume Performed at [facility] [address], [place], [place] [address]    Culture  Setup Time   Final    GRAM POSITIVE COCCI IN CLUSTERS ANAEROBIC BOTTLE ONLY CRITICAL RESULT CALLED TO, READ BACK BY AND VERIFIED WITH: Jene Every PharmD 15:15 07/16/18 ([email or web address])    Culture (A)  Final    STAPHYLOCOCCUS SPECIES (COAGULASE NEGATIVE) THE SIGNIFICANCE OF ISOLATING THIS ORGANISM FROM A SINGLE SET OF BLOOD CULTURES WHEN MULTIPLE SETS ARE DRAWN IS UNCERTAIN. PLEASE NOTIFY THE MICROBIOLOGY DEPARTMENT WITHIN ONE WEEK IF SPECIATION AND SENSITIVITIES ARE REQUIRED. Performed at [facility], [place] [address], [place], [place] [address]    Report Status 07/19/2018 FINAL  Final  Blood Culture ID Panel (Reflexed)     Status: Abnormal    Collection Time: 07/15/18  4:47 PM  Result Value Ref Range Status   Enterococcus species NOT DETECTED NOT DETECTED Final   Listeria monocytogenes NOT DETECTED NOT DETECTED Final   Staphylococcus species DETECTED (A) NOT DETECTED Final    Comment: Methicillin (oxacillin) susceptible coagulase negative staphylococcus. Possible blood culture contaminant (unless isolated from more than one blood culture draw or clinical case suggests pathogenicity). No antibiotic treatment is indicated for blood  culture contaminants. CRITICAL RESULT CALLED TO, READ BACK BY AND VERIFIED WITH: Jene Every PharmD 15:15 07/16/18 ([email or web address])    Staphylococcus aureus NOT DETECTED NOT DETECTED Final   Methicillin resistance NOT DETECTED NOT DETECTED Final   Streptococcus species NOT DETECTED NOT DETECTED Final   Streptococcus agalactiae NOT DETECTED NOT DETECTED Final   Streptococcus pneumoniae NOT DETECTED NOT DETECTED Final   Streptococcus pyogenes NOT DETECTED NOT DETECTED Final   Acinetobacter baumannii NOT DETECTED NOT DETECTED Final   Enterobacteriaceae species NOT DETECTED NOT DETECTED Final   Enterobacter cloacae complex NOT DETECTED NOT DETECTED Final  Escherichia coli NOT DETECTED NOT DETECTED Final   Klebsiella oxytoca NOT DETECTED NOT DETECTED Final   Klebsiella pneumoniae NOT DETECTED NOT DETECTED Final   Proteus species NOT DETECTED NOT DETECTED Final   Serratia marcescens NOT DETECTED NOT DETECTED Final   Haemophilus influenzae NOT DETECTED NOT DETECTED Final   Neisseria meningitidis NOT DETECTED NOT DETECTED Final   Pseudomonas aeruginosa NOT DETECTED NOT DETECTED Final   Candida albicans NOT DETECTED NOT DETECTED Final   Candida glabrata NOT DETECTED NOT DETECTED Final   Candida krusei NOT DETECTED NOT DETECTED Final   Candida parapsilosis NOT DETECTED NOT DETECTED Final   Candida tropicalis NOT DETECTED NOT DETECTED Final    Comment: Performed at [facility], [place] [address] [place],  [place] [address]  Aerobic/Anaerobic Culture (surgical/deep wound)     Status: None (Preliminary result)   Collection Time: 07/15/18  8:53 PM  Result Value Ref Range Status   Specimen Description   Final    WOUND BACK Performed at [facility] [address], [place], [place] [address]    Special Requests   Final    NONE Performed at [facility], [place] [address], [place], [place] [address]    Gram Stain   Final    RARE WBC PRESENT, PREDOMINANTLY PMN RARE GRAM POSITIVE COCCI    Culture   Final    NO GROWTH 3 DAYS NO ANAEROBES ISOLATED; CULTURE IN PROGRESS FOR 5 DAYS Performed at [facility] [address] [place], [place] [address]    Report Status PENDING  Incomplete  MRSA PCR Screening     Status: None   Collection Time: 07/16/18  3:20 AM  Result Value Ref Range Status   MRSA by PCR NEGATIVE NEGATIVE Final    Comment:        The GeneXpert MRSA Assay (FDA approved for NASAL specimens only), is one component of a comprehensive MRSA colonization surveillance program. It is not intended to diagnose MRSA infection nor to guide or monitor treatment for MRSA infections. Performed at [facility], [place] [address], [place], [place] [address]     RADIOLOGY STUDIES/RESULTS: Ct Abdomen Pelvis Wo Contrast  Result Date: 07/15/2018 CLINICAL DATA:  53 y/o M; right lower quadrant abdominal pain radiating to the left flank for 2 months. Reports mass to the right abdomen. EXAM: CT ABDOMEN AND PELVIS WITHOUT CONTRAST TECHNIQUE: Multidetector CT imaging of the abdomen and pelvis was performed following the standard protocol without IV contrast. COMPARISON:  12/16/2010 CT abdomen and pelvis. FINDINGS: Lower chest: Severe coronary artery calcific atherosclerosis and dense mitral annular calcification. Right central venous catheter tip projects over the right atrium. 2-3 mm nodules at the lung bases, some obscured by atelectasis on the prior CT.  Hepatobiliary: No focal liver abnormality is seen. No gallstones, gallbladder wall thickening, or biliary dilatation. Pancreas: Unremarkable. No pancreatic ductal dilatation or surrounding inflammatory changes. Spleen: Normal in size without focal abnormality. Adrenals/Urinary Tract: Adrenal glands are unremarkable. Atrophic kidneys bilaterally. Kidneys are otherwise normal, without renal calculi, focal lesion, or hydronephrosis. Bladder is unremarkable. Stomach/Bowel: Stomach is within normal limits. Appendix appears normal. No evidence of bowel wall thickening, distention, or inflammatory changes. Vascular/Lymphatic: Aortic atherosclerosis. No enlarged abdominal or pelvic lymph nodes. Reproductive: Prostate is unremarkable. Other: No abdominal wall hernia or abnormality. No abdominopelvic ascites. Musculoskeletal: New L2 vertebral body subcentimeter lucent foci (series 4, image 90). No acute fracture. Increased density of bones. IMPRESSION: 1. No acute process  identified. 2. 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 Differential includes lytic metastasis or myeloma. No direct connection to the disc space identified to suggest Schmorl's node. Follow-up recommended to ensure stability. 3. Severe coronary and aortic calcific atherosclerosis. Electronically Signed   By: Kristine Garbe M.D.   On: 07/15/2018 17:46   Dg Chest 2 View  Result Date: 07/15/2018 CLINICAL DATA:  Sepsis.  Right lower quadrant pain. EXAM: CHEST - 2 VIEW COMPARISON:  12/16/2010 FINDINGS: There is a right jugular dialysis catheter with the tip extending into the right atrium. Low lung volumes. Left basilar densities are most compatible with atelectasis/scar and may be chronic. Cardiac silhouette appears to be within normal limits and stable but poorly characterized on this low lung volume examination. Negative for pneumothorax. No significant airspace disease or  pulmonary edema. No large pleural effusions. IMPRESSION: Low lung volumes without acute findings. Presence of a dialysis catheter. Electronically Signed   By: Markus Daft M.D.   On: 07/15/2018 17:07   Ir Fluoro Guide Cv Line Left  Result Date: 07/19/2018 INDICATION: 52 year old male with end-stage renal disease on hemodialysis. He was previously dialyzing via a right IJ approach tunneled hemodialysis catheter, however he presented with bacteremia and the catheter was removed yesterday. He has now been nearly 24 hours on a line holiday but requires dialysis today. He presents for placement of a new left-sided tunneled hemodialysis catheter. EXAM: TUNNELED CENTRAL VENOUS HEMODIALYSIS CATHETER PLACEMENT WITH ULTRASOUND AND FLUOROSCOPIC GUIDANCE MEDICATIONS: 2 g Ancef. The antibiotic was given in an appropriate time interval prior to skin puncture. ANESTHESIA/SEDATION: Moderate (conscious) sedation was employed during this procedure. A total of Versed 2 mg and Fentanyl 100 mcg was administered intravenously. Moderate Sedation Time: 23 minutes. The patient's level of consciousness and vital signs were monitored continuously by radiology nursing throughout the procedure under my direct supervision. FLUOROSCOPY TIME:  Fluoroscopy Time: 0 minutes 54 seconds (10 mGy). COMPLICATIONS: None immediate. PROCEDURE: Informed written consent was obtained from the patient after a discussion of the risks, benefits, and alternatives to treatment. Questions regarding the procedure were encouraged and answered. The left neck and chest were prepped with chlorhexidine in a sterile fashion, and a sterile drape was applied covering the operative field. Maximum barrier sterile technique with sterile gowns and gloves were used for the procedure. A timeout was performed prior to the initiation of the procedure. After creating a small venotomy incision, a micropuncture kit was utilized to access the left internal jugular vein under direct,  real-time ultrasound guidance after the overlying soft tissues were anesthetized with 1% lidocaine with epinephrine. Ultrasound image documentation was performed. The microwire was kinked to measure appropriate catheter length. A stiff Glidewire was advanced to the level of the IVC and the micropuncture sheath was exchanged for a peel-away sheath. A palindrome tunneled hemodialysis catheter measuring 23 cm from tip to cuff was tunneled in a retrograde fashion from the anterior chest wall to the venotomy incision. The catheter was then placed through the peel-away sheath with tips ultimately positioned within the superior aspect of the right atrium. Final catheter positioning was confirmed and documented with a spot radiographic image. The catheter aspirates and flushes normally. The catheter was flushed with appropriate volume heparin dwells. The catheter exit site was secured with a 0-Prolene retention suture. The venotomy incision was closed with Dermabond. Dressings were applied. The patient tolerated the procedure well without immediate post procedural complication. IMPRESSION: Successful placement of 23  cm tip to cuff tunneled hemodialysis catheter via the left internal jugular vein with tips terminating within the superior aspect of the right atrium. The catheter is ready for immediate use. Electronically Signed   By: Jacqulynn Cadet M.D.   On: 07/19/2018 11:07   Ir Removal Tun Cv Cath W/o Fl  Result Date: 07/18/2018 INDICATION: Request for removal of tunneled HD catheter which was placed approximately 3 years ago at [facility] in [place]. Patient admitted to [facility] for bacteremia - request to remove tunneled HD catheter today as possible infectious source with plans to replace tomorrow. EXAM: REMOVAL TUNNELED CENTRAL VENOUS CATHETER MEDICATIONS: 10 mL 2% lidocaine. ANESTHESIA/SEDATION: None. FLUOROSCOPY TIME:  None. COMPLICATIONS: None immediate. PROCEDURE: Informed written consent was obtained from the  patient after a thorough discussion of the procedural risks, benefits and alternatives. All questions were addressed. Maximal Sterile Barrier Technique was utilized including mask, sterile gloves, sterile drape, hand hygiene and skin antiseptic. A timeout was performed prior to the initiation of the procedure. The patient's right chest and catheter was prepped and draped in a normal sterile fashion. Heparin was removed from both ports of catheter. 1% lidocaine was used for local anesthesia. Using gentle blunt dissection the cuff of the catheter was exposed and the catheter was removed in it's entirety. Pressure was held until hemostasis was obtained. A sterile dressing was applied. The patient tolerated the procedure well with no immediate complications. IMPRESSION: Successful catheter removal as described above. Read by Candiss Norse, PA-C Electronically Signed   By: Jacqulynn Cadet M.D.   On: 07/18/2018 14:32   Ir US Guide Vasc Access Left  Result Date: 07/19/2018 INDICATION: 52 year old male with end-stage renal disease on hemodialysis. He was previously dialyzing via a right IJ approach tunneled hemodialysis catheter, however he presented with bacteremia and the catheter was removed yesterday. He has now been nearly 24 hours on a line holiday but requires dialysis today. He presents for placement of a new left-sided tunneled hemodialysis catheter. EXAM: TUNNELED CENTRAL VENOUS HEMODIALYSIS CATHETER PLACEMENT WITH ULTRASOUND AND FLUOROSCOPIC GUIDANCE MEDICATIONS: 2 g Ancef. The antibiotic was given in an appropriate time interval prior to skin puncture. ANESTHESIA/SEDATION: Moderate (conscious) sedation was employed during this procedure. A total of Versed 2 mg and Fentanyl 100 mcg was administered intravenously. Moderate Sedation Time: 23 minutes. The patient's level of consciousness and vital signs were monitored continuously by radiology nursing throughout the procedure under my direct supervision.  FLUOROSCOPY TIME:  Fluoroscopy Time: 0 minutes 54 seconds (10 mGy). COMPLICATIONS: None immediate. PROCEDURE: Informed written consent was obtained from the patient after a discussion of the risks, benefits, and alternatives to treatment. Questions regarding the procedure were encouraged and answered. The left neck and chest were prepped with chlorhexidine in a sterile fashion, and a sterile drape was applied covering the operative field. Maximum barrier sterile technique with sterile gowns and gloves were used for the procedure. A timeout was performed prior to the initiation of the procedure. After creating a small venotomy incision, a micropuncture kit was utilized to access the left internal jugular vein under direct, real-time ultrasound guidance after the overlying soft tissues were anesthetized with 1% lidocaine with epinephrine. Ultrasound image documentation was performed. The microwire was kinked to measure appropriate catheter length. A stiff Glidewire was advanced to the level of the IVC and the micropuncture sheath was exchanged for a peel-away sheath. A palindrome tunneled hemodialysis catheter measuring 23 cm from tip to cuff was tunneled in a retrograde fashion from the anterior  chest wall to the venotomy incision. The catheter was then placed through the peel-away sheath with tips ultimately positioned within the superior aspect of the right atrium. Final catheter positioning was confirmed and documented with a spot radiographic image. The catheter aspirates and flushes normally. The catheter was flushed with appropriate volume heparin dwells. The catheter exit site was secured with a 0-Prolene retention suture. The venotomy incision was closed with Dermabond. Dressings were applied. The patient tolerated the procedure well without immediate post procedural complication. IMPRESSION: Successful placement of 23 cm tip to cuff tunneled hemodialysis catheter via the left internal jugular vein with tips  terminating within the superior aspect of the right atrium. The catheter is ready for immediate use. Electronically Signed   By: Jacqulynn Cadet M.D.   On: 07/19/2018 11:07   Dg Abd 2 Views  Result Date: 07/17/2018 CLINICAL DATA:  Abdominal pain.  LEFT lower quadrant pain. EXAM: ABDOMEN - 2 VIEW COMPARISON:  None. FINDINGS: Suboptimal images due to body habitus. The bowel gas pattern is normal. There is no evidence of free air. No radio-opaque calculi or other significant radiographic abnormality is seen. IMPRESSION: Grossly negative flat and upright abdominal radiographs. Electronically Signed   By: Staci Righter M.D.   On: 07/17/2018 15:26     LOS: 4 days   Lala Lund, MD  [facility]  If 7PM-7AM, please contact night-coverage  Please page via [email or web address]   07/19/2018, 12:01 PM

## 2018-07-19 NOTE — Sedation Documentation (Signed)
MD reviewed allergies and approved 2gm ancef pre op

## 2018-07-19 NOTE — Progress Notes (Signed)
tx stopped after 2.5hrs of HD tx, stated that he is under his dry weight so he wd not run the full 4.5hrs ordered. Paged Dr. Johnney Ou at [phone] to inform her; No return call at this time.

## 2018-07-20 LAB — GLUCOSE, CAPILLARY
GLUCOSE-CAPILLARY: 263 mg/dL — AB (ref 70–99)
GLUCOSE-CAPILLARY: 192 mg/dL — AB (ref 70–99)
Glucose-Capillary: 335 mg/dL — ABNORMAL HIGH (ref 70–99)
Glucose-Capillary: 230 mg/dL — ABNORMAL HIGH (ref 70–99)

## 2018-07-20 MED ORDER — SEVELAMER CARBONATE 800 MG PO TABS
2400.0000 mg | ORAL_TABLET | Freq: Three times a day (TID) | ORAL | Status: DC
  Administered 2018-07-20 – 2018-07-23 (×5): 2400 mg via ORAL
  Filled 2018-07-20 (×5): qty 3

## 2018-07-20 MED ORDER — NEPRO/CARBSTEADY PO LIQD
237.0000 mL | Freq: Two times a day (BID) | ORAL | Status: DC
  Administered 2018-07-20 (×2): 237 mL via ORAL
  Filled 2018-07-20 (×8): qty 237

## 2018-07-20 NOTE — Progress Notes (Addendum)
[facility] Progress Note   Subjective: Very talkative. No C/Os. Patient says his EDW is higher than current orders. HD yesterday, still with LLE edema. New TDC placed.   He cut his treatment short yesterday  - removed only 1700.  Appreciative of care here but wants to stay to get abdominal pain and new lesion on back worked up   Objective Vitals:   07/19/18 1608 07/19/18 1707 07/19/18 2154 07/20/18 0522  BP: (!) 136/49 128/84 (!) 150/98 140/74  Pulse: 75 77 86 77  Resp: 20 18 18 18   Temp: 97.7 F (36.5 C) 97.6 F (36.4 C) 98.5 F (36.9 C) 99 F (37.2 C)  TempSrc: Oral Oral Oral Oral  SpO2: 99% 99% 96% 96%  Weight: 130.2 kg (287 lb 0.6 oz)  132.7 kg (292 lb 8 oz)   Height:       Physical Exam General: Obese pleasant, NAD Heart: HD distant, S1,S2, RRR Lungs: CTAB slightly decreased LL base.  Abdomen: Obese, still with tenderness RLQ Extremities: R BKA with prosthesis, LLE with trace-1+ edema in spite of compression house.  Dialysis Access: LIJ TDC placed 07/19/2018   Additional Objective Labs: Basic Metabolic Panel: Recent Labs  Lab 07/17/18 0625 07/18/18 0625 07/19/18 0415  NA 134* 135 135  K 5.5* 5.3* 5.3*  CL 93* 94* 94*  CO2 22 25 26   GLUCOSE 315* 326* 309*  BUN 63* 39* 52*  CREATININE 11.79* 8.97* 10.81*  CALCIUM 9.4 9.3 9.4  PHOS 9.6* 7.8* 9.3*   Liver Function Tests: Recent Labs  Lab 07/15/18 1643 07/17/18 0625 07/18/18 0625 07/19/18 0415  AST 23  --   --   --   ALT 16  --   --   --   ALKPHOS 139*  --   --   --   BILITOT 0.5  --   --   --   PROT 7.7  --   --   --   ALBUMIN 3.5 3.4* 3.3* 3.3*   Recent Labs  Lab 07/15/18 1643  LIPASE 54*   CBC: Recent Labs  Lab 07/15/18 1643 07/17/18 0625 07/17/18 0828 07/18/18 0625  WBC 6.9 9.3 6.1 6.9  NEUTROABS 4.8  --   --   --   HGB 12.3* 6.7* 11.3* 11.2*  HCT 36.1* 21.3* 34.9* 35.0*  MCV 87.4 92.6 90.9 91.9  PLT 262 281 219 192   Blood Culture    Component Value Date/Time    SDES  07/15/2018 2053    WOUND BACK Performed at [facility], [place] [address] [place], [place] [address]    SPECREQUEST  07/15/2018 2053    NONE Performed at [facility], [place] [address], [place], [place] [address]    CULT  07/15/2018 2053    NO GROWTH 3 DAYS NO ANAEROBES ISOLATED; CULTURE IN PROGRESS FOR 5 DAYS Performed at [facility] [address] [place], [place] [address]    REPTSTATUS PENDING 07/15/2018 2053    Cardiac Enzymes: No results for input(s): CKTOTAL, CKMB, CKMBINDEX, TROPONINI in the last 168 hours. CBG: Recent Labs  Lab 07/19/18 0735 07/19/18 1151 07/19/18 1703 07/19/18 2151 07/20/18 0812  GLUCAP 283* 300* 263* 274* 263*   Iron Studies: No results for input(s): IRON, TIBC, TRANSFERRIN, FERRITIN in the last 72 hours. @lablastinr3 @ Studies/Results: Ir Fluoro Guide Cv Line Left  Result Date: 07/19/2018 INDICATION: 52 year old male with end-stage renal disease on hemodialysis. He was previously dialyzing via a right IJ approach tunneled  hemodialysis catheter, however he presented with bacteremia and the catheter was removed yesterday. He has now been nearly 24 hours on a line holiday but requires dialysis today. He presents for placement of a new left-sided tunneled hemodialysis catheter. EXAM: TUNNELED CENTRAL VENOUS HEMODIALYSIS CATHETER PLACEMENT WITH ULTRASOUND AND FLUOROSCOPIC GUIDANCE MEDICATIONS: 2 g Ancef. The antibiotic was given in an appropriate time interval prior to skin puncture. ANESTHESIA/SEDATION: Moderate (conscious) sedation was employed during this procedure. A total of Versed 2 mg and Fentanyl 100 mcg was administered intravenously. Moderate Sedation Time: 23 minutes. The patient's level of consciousness and vital signs were monitored continuously by radiology nursing throughout the procedure under my direct supervision. FLUOROSCOPY TIME:  Fluoroscopy Time: 0 minutes 54 seconds (10 mGy). COMPLICATIONS:  None immediate. PROCEDURE: Informed written consent was obtained from the patient after a discussion of the risks, benefits, and alternatives to treatment. Questions regarding the procedure were encouraged and answered. The left neck and chest were prepped with chlorhexidine in a sterile fashion, and a sterile drape was applied covering the operative field. Maximum barrier sterile technique with sterile gowns and gloves were used for the procedure. A timeout was performed prior to the initiation of the procedure. After creating a small venotomy incision, a micropuncture kit was utilized to access the left internal jugular vein under direct, real-time ultrasound guidance after the overlying soft tissues were anesthetized with 1% lidocaine with epinephrine. Ultrasound image documentation was performed. The microwire was kinked to measure appropriate catheter length. A stiff Glidewire was advanced to the level of the IVC and the micropuncture sheath was exchanged for a peel-away sheath. A palindrome tunneled hemodialysis catheter measuring 23 cm from tip to cuff was tunneled in a retrograde fashion from the anterior chest wall to the venotomy incision. The catheter was then placed through the peel-away sheath with tips ultimately positioned within the superior aspect of the right atrium. Final catheter positioning was confirmed and documented with a spot radiographic image. The catheter aspirates and flushes normally. The catheter was flushed with appropriate volume heparin dwells. The catheter exit site was secured with a 0-Prolene retention suture. The venotomy incision was closed with Dermabond. Dressings were applied. The patient tolerated the procedure well without immediate post procedural complication. IMPRESSION: Successful placement of 23 cm tip to cuff tunneled hemodialysis catheter via the left internal jugular vein with tips terminating within the superior aspect of the right atrium. The catheter is ready  for immediate use. Electronically Signed   By: Jacqulynn Cadet M.D.   On: 07/19/2018 11:07   Ir Removal Tun Cv Cath W/o Fl  Result Date: 07/18/2018 INDICATION: Request for removal of tunneled HD catheter which was placed approximately 3 years ago at [facility] in [place]. Patient admitted to [facility] for bacteremia - request to remove tunneled HD catheter today as possible infectious source with plans to replace tomorrow. EXAM: REMOVAL TUNNELED CENTRAL VENOUS CATHETER MEDICATIONS: 10 mL 2% lidocaine. ANESTHESIA/SEDATION: None. FLUOROSCOPY TIME:  None. COMPLICATIONS: None immediate. PROCEDURE: Informed written consent was obtained from the patient after a thorough discussion of the procedural risks, benefits and alternatives. All questions were addressed. Maximal Sterile Barrier Technique was utilized including mask, sterile gloves, sterile drape, hand hygiene and skin antiseptic. A timeout was performed prior to the initiation of the procedure. The patient's right chest and catheter was prepped and draped in a normal sterile fashion. Heparin was removed from both ports of catheter. 1% lidocaine was used for local anesthesia. Using gentle blunt dissection the cuff of  the catheter was exposed and the catheter was removed in it's entirety. Pressure was held until hemostasis was obtained. A sterile dressing was applied. The patient tolerated the procedure well with no immediate complications. IMPRESSION: Successful catheter removal as described above. Read by Candiss Norse, PA-C Electronically Signed   By: Jacqulynn Cadet M.D.   On: 07/18/2018 14:32   Ir US Guide Vasc Access Left  Result Date: 07/19/2018 INDICATION: 52 year old male with end-stage renal disease on hemodialysis. He was previously dialyzing via a right IJ approach tunneled hemodialysis catheter, however he presented with bacteremia and the catheter was removed yesterday. He has now been nearly 24 hours on a line holiday but requires dialysis  today. He presents for placement of a new left-sided tunneled hemodialysis catheter. EXAM: TUNNELED CENTRAL VENOUS HEMODIALYSIS CATHETER PLACEMENT WITH ULTRASOUND AND FLUOROSCOPIC GUIDANCE MEDICATIONS: 2 g Ancef. The antibiotic was given in an appropriate time interval prior to skin puncture. ANESTHESIA/SEDATION: Moderate (conscious) sedation was employed during this procedure. A total of Versed 2 mg and Fentanyl 100 mcg was administered intravenously. Moderate Sedation Time: 23 minutes. The patient's level of consciousness and vital signs were monitored continuously by radiology nursing throughout the procedure under my direct supervision. FLUOROSCOPY TIME:  Fluoroscopy Time: 0 minutes 54 seconds (10 mGy). COMPLICATIONS: None immediate. PROCEDURE: Informed written consent was obtained from the patient after a discussion of the risks, benefits, and alternatives to treatment. Questions regarding the procedure were encouraged and answered. The left neck and chest were prepped with chlorhexidine in a sterile fashion, and a sterile drape was applied covering the operative field. Maximum barrier sterile technique with sterile gowns and gloves were used for the procedure. A timeout was performed prior to the initiation of the procedure. After creating a small venotomy incision, a micropuncture kit was utilized to access the left internal jugular vein under direct, real-time ultrasound guidance after the overlying soft tissues were anesthetized with 1% lidocaine with epinephrine. Ultrasound image documentation was performed. The microwire was kinked to measure appropriate catheter length. A stiff Glidewire was advanced to the level of the IVC and the micropuncture sheath was exchanged for a peel-away sheath. A palindrome tunneled hemodialysis catheter measuring 23 cm from tip to cuff was tunneled in a retrograde fashion from the anterior chest wall to the venotomy incision. The catheter was then placed through the  peel-away sheath with tips ultimately positioned within the superior aspect of the right atrium. Final catheter positioning was confirmed and documented with a spot radiographic image. The catheter aspirates and flushes normally. The catheter was flushed with appropriate volume heparin dwells. The catheter exit site was secured with a 0-Prolene retention suture. The venotomy incision was closed with Dermabond. Dressings were applied. The patient tolerated the procedure well without immediate post procedural complication. IMPRESSION: Successful placement of 23 cm tip to cuff tunneled hemodialysis catheter via the left internal jugular vein with tips terminating within the superior aspect of the right atrium. The catheter is ready for immediate use. Electronically Signed   By: Jacqulynn Cadet M.D.   On: 07/19/2018 11:07   Medications: .  ceFAZolin (ANCEF) IV 1 g (07/19/18 2225)   . amLODipine  5 mg Oral Daily  . Chlorhexidine Gluconate Cloth  6 each Topical Q0600  . gabapentin  300 mg Oral QHS  . heparin  5,000 Units Subcutaneous Q8H  . hydrALAZINE  50 mg Oral Q8H  . insulin aspart  0-15 Units Subcutaneous TID WC  . insulin glargine  8 Units Subcutaneous QHS  .  levothyroxine  25 mcg Oral QAC breakfast  . lidocaine  2 patch Transdermal Q24H  . metoprolol tartrate  100 mg Oral BID  . multivitamin  1 tablet Oral QHS  . ondansetron (ZOFRAN) IV  4 mg Intravenous Once  . pantoprazole  40 mg Oral Q0600  . polyethylene glycol  17 g Oral Daily  . senna  2 tablet Oral QHS  . sevelamer carbonate  1,600 mg Oral TID WC     Dialysis Orders: TTS 4.5 Hr [facility] [phone] EDW 279 lbs # (127.6 kg ) prosthesis weighs 33# =15#  Assessment/Plan: 1. Bacteremia + MSSE-1/2 BC IR removed catheter 8/2 replaced per IR 07/19/2018. Continue Ancef. Will require total of 3  weeks of Ancef.  2. ESRD -T,[facility], [place] [facility]. I thought could be d/cd but does not appear that pt desires that "one more day"    Would benefit from extra treatment tomorrow - is agreeable, then after that could go to get Tuesday tx at his regular unit 3. Anemia - hgb 11.2- no meds  4. Secondary hyperparathyroidism - on renvela. Phos 9.3 Ca 9.4 C Ca 10.0. Increase binders.  5. HTN/volume - HD 07/19/18 Pre wt 134.3 Net UF 1669 Post wt 130.2. Still above OP EDW although pt states EDW is supposed to be higher. Reasonable controlled BP. No SOB.  6. Nutrition - Albumin 3.3 change to renal carb mod diet + vits add nepro.  7. Abscess  Back s/p I and D - on Ancef gram + cocci 8. 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 Started on gabapentin and lidoderm patch. Chronic issue 9. DM - per primary 10  New L2 foci- need re image ?  Other work up ?  Per primary     Jimmye Norman. Brown NP-C 07/20/2018, 8:25 AM  [facility] [phone]  Patient seen and examined, agree with above note with above modifications. Sitting up - looks good.  I thought ready for discharge but pt saying he wants more things settled with abd pain and new back lesion.  I dont think will get anywhere with abd pain and I have told him that.  He wants to stay one more day to make sure there is a plan.  Since going to be here - could benefit from extra HD - will do short tx tomorrow  Corliss Parish, MD 07/20/2018

## 2018-07-20 NOTE — Progress Notes (Signed)
PROGRESS NOTE        PATIENT DETAILS Name: David Walsh Age: 52 y.o. Sex: male Date of Birth: [birth year] Admit Date: 07/15/2018 Admitting Physician Ejiroghene Arlyce Dice, MD [id number]  Brief Narrative: Patient is a 52 y.o. male with history of ESRD on HD TTS, right lower extremity BKA, DM, hypertension, chronic abdominal pain (ongoing for the past 2 years) presented to the ED with abdominal pain and a upper back abscess.  Patient underwent I&D-started on empiric antibiotics and is admitted to the hospitalist service.  1 out of 2 blood cultures was positive for coag negative staph, subsequently tunneled dialysis catheter was removed by IR on 8/2, with plans to replace dialysis catheter either on 8/3 or 8/4.  See below for further details  Subjective: Patient in bed, appears comfortable, denies any headache, no fever, no chest pain or pressure, no shortness of breath , does have chronic more than 52-year-old generalized abdominal pain. No focal weakness.  Assessment/Plan:  Small upper back abscess: Underwent I&D in the emergency room.  Wound in the upper back looks stable still has significant purulent discharge.  Empiric IV Ancef-previously on vancomycin.  Coag negative staph bacteremia: 1 out of 2 blood cultures positive for gram-negative staph, since patient does have a HD catheter in place-nephrology recommending HD catheter removal, this was subsequently done on 8/2.  Tunneled catheter was replaced by IR on 07/19/2018, discussed the case with nephrologist Dr. Moshe Cipro and also ID physician Dr. Drucilla Schmidt by me on 07/19/2018, patient will require total of 2 more weeks of IV Ancef, this will have to be communicated to [facility] nephrology service prior to discharge through social work/ case management.  Abdominal pain: Ongoing for the past 2 years but worsening lately, CT of the abdomen did not show any acute abnormalities.  Patient apparently did have a colonoscopy  last year at the [place] system which was reportedly normal.  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  Having daily BMs-on MiraLAX and senna.  [facility]  ESRD: On hemodialysis.  Nephrology on board they want to do one more hemodialysis tomorrow which is Monday and he will continue his routine schedule which will be Tuesday Thursday and Saturday .  DM-2: on Lantus and sliding scale will monitor and adjust.  CBG (last 3)  Recent Labs    07/20/18 0812 07/20/18 1149 07/20/18 1649  GLUCAP 263* 335* 192*     Hypertension: Controlled-med rec finally completed by pharmacy-resume metoprolol and hydralazine, continue with amlodipine.   Hypothyroidism: Resume levothyroxine  Hx of BKA, right   DVT Prophylaxis: Prophylactic Heparin   Code Status: Full code   Family Communication:  wife over the phone  Disposition Plan: Remain inpatient-will require a few more days of hospitalization before consideration of discharge.  Antimicrobial agents: Anti-infectives (From admission, onward)   Start     Dose/Rate Route Frequency Ordered Stop   07/19/18 0933  ceFAZolin (ANCEF) 2-4 GM/100ML-% IVPB    Note to Pharmacy:  Hilma Favors   : cabinet override      07/19/18 0933 07/19/18 0940   07/18/18 2200  ceFAZolin (ANCEF) IVPB 1 g/50 mL premix     1 g 100 mL/hr over 30 Minutes Intravenous Every 24 hours 07/18/18 1006  07/18/18 1445  vancomycin (VANCOCIN) IVPB 1000 mg/200 mL premix     1,000 mg 200 mL/hr over 60 Minutes Intravenous On call 07/18/18 1437 07/19/18 1445   07/17/18 1200  vancomycin (VANCOCIN) IVPB 1000 mg/200 mL premix  Status:  Discontinued     1,000 mg 200 mL/hr over 60 Minutes Intravenous Every T-Th-Sa (Hemodialysis) 07/16/18 0129 07/18/18 0911   07/17/18 0849  vancomycin (VANCOCIN) 1-5 GM/200ML-% IVPB    Note to Pharmacy:  Rodell Perna   :  cabinet override      07/17/18 0849 07/17/18 1020   07/17/18 0849  vancomycin (VANCOCIN) 1-5 GM/200ML-% IVPB    Note to Pharmacy:  Rodell Perna   : cabinet override      07/17/18 0849 07/17/18 1020   07/16/18 0015  vancomycin (VANCOCIN) IVPB 1000 mg/200 mL premix     1,000 mg 200 mL/hr over 60 Minutes Intravenous  Once 07/16/18 0006 07/16/18 2117   07/15/18 2345  cefTRIAXone (ROCEPHIN) 2 g in sodium chloride 0.9 % 100 mL IVPB  Status:  Discontinued     2 g 200 mL/hr over 30 Minutes Intravenous Daily at bedtime 07/15/18 2329 07/16/18 1552   07/15/18 1630  levofloxacin (LEVAQUIN) IVPB 750 mg     750 mg 100 mL/hr over 90 Minutes Intravenous  Once 07/15/18 1616 07/15/18 1917   07/15/18 1630  aztreonam (AZACTAM) 2 g in sodium chloride 0.9 % 100 mL IVPB     2 g 200 mL/hr over 30 Minutes Intravenous  Once 07/15/18 1616 07/15/18 2019   07/15/18 1630  vancomycin (VANCOCIN) IVPB 1000 mg/200 mL premix     1,000 mg 200 mL/hr over 60 Minutes Intravenous  Once 07/15/18 1616 07/15/18 2204      Procedures: 8/2>> HD catheter removal by IR 07/19/2018.  Tunneled catheter replaced by IR  CONSULTS: Nephrology, GI, ID over the phone Dr. Drucilla Schmidt.  Time spent: 25- minutes-Greater than 50% of this time was spent in counseling, explanation of diagnosis, planning of further management, and coordination of care.  MEDICATIONS: Scheduled Meds: . amLODipine  5 mg Oral Daily  . Chlorhexidine Gluconate Cloth  6 each Topical Q0600  . feeding supplement (NEPRO CARB STEADY)  237 mL Oral BID BM  . gabapentin  300 mg Oral QHS  . heparin  5,000 Units Subcutaneous Q8H  . hydrALAZINE  50 mg Oral Q8H  . insulin aspart  0-15 Units Subcutaneous TID WC  . insulin glargine  8 Units Subcutaneous QHS  . levothyroxine  25 mcg Oral QAC breakfast  . lidocaine  2 patch Transdermal Q24H  . metoprolol tartrate  100 mg Oral BID  . multivitamin  1 tablet Oral QHS  . ondansetron (ZOFRAN) IV  4 mg Intravenous Once  .  pantoprazole  40 mg Oral Q0600  . polyethylene glycol  17 g Oral Daily  . senna  2 tablet Oral QHS  . sevelamer carbonate  2,400 mg Oral TID WC   Continuous Infusions: .  ceFAZolin (ANCEF) IV 1 g (07/19/18 2225)   PRN Meds:.acetaminophen **OR** acetaminophen, fentaNYL (SUBLIMAZE) injection, hydrALAZINE, HYDROcodone-acetaminophen, ondansetron **OR** ondansetron (ZOFRAN) IV   PHYSICAL EXAM: Vital signs: Vitals:   07/19/18 2154 07/20/18 0522 07/20/18 0920 07/20/18 1652  BP: (!) 150/98 140/74 (!) 149/101 137/79  Pulse: 86 77 77 72  Resp: 18 18 16 16   Temp: 98.5 F (36.9 C) 99 F (37.2 C) 98.5 F (36.9 C) 98.1 F (36.7 C)  TempSrc: Oral Oral Oral Oral  SpO2: 96% 96% 98% 95%  Weight: 132.7 kg (292 lb 8 oz)     Height:       Filed Weights   07/19/18 1302 07/19/18 1608 07/19/18 2154  Weight: 134.3 kg (296 lb 1.2 oz) 130.2 kg (287 lb 0.6 oz) 132.7 kg (292 lb 8 oz)   Body mass index is 33.8 kg/m.   Awake Alert, Oriented X 3, No new F.N deficits, Normal affect [place].AT,PERRAL Supple Neck,No JVD, No cervical lymphadenopathy appriciated.  Symmetrical Chest wall movement, Good air movement bilaterally, CTAB RRR,No Gallops, Rubs or new Murmurs, No Parasternal Heave +ve B.Sounds, Abd Soft, No tenderness, No organomegaly appriciated, No rebound - guarding or rigidity. No Cyanosis, Clubbing or edema, No new Rash or bruise Skin: I&D site at the upper mid back shows a shallow abscess cavity that is still draining.  I have personally reviewed following labs and imaging studies  LABORATORY DATA: CBC: Recent Labs  Lab 07/15/18 1643 07/17/18 0625 07/17/18 0828 07/18/18 0625  WBC 6.9 9.3 6.1 6.9  NEUTROABS 4.8  --   --   --   HGB 12.3* 6.7* 11.3* 11.2*  HCT 36.1* 21.3* 34.9* 35.0*  MCV 87.4 92.6 90.9 91.9  PLT 262 281 219 270    Basic Metabolic Panel: Recent Labs  Lab 07/15/18 1643 07/16/18 0920 07/16/18 1807 07/17/18 0625 07/18/18 0625 07/19/18 0415  NA 132* 133*  --  134*  135 135  K 5.0 4.9  --  5.5* 5.3* 5.3*  CL 95* 94*  --  93* 94* 94*  CO2 25 25  --  22 25 26   GLUCOSE 339* 258*  --  315* 326* 309*  BUN 43* 50*  --  63* 39* 52*  CREATININE 7.86* 9.56*  --  11.79* 8.97* 10.81*  CALCIUM 9.1 9.5  --  9.4 9.3 9.4  PHOS  --   --  8.0* 9.6* 7.8* 9.3*    GFR: Estimated Creatinine Clearance: 12.2 mL/min (A) (by C-G formula based on SCr of 10.81 mg/dL (H)).  Liver Function Tests: Recent Labs  Lab 07/15/18 1643 07/17/18 0625 07/18/18 0625 07/19/18 0415  AST 23  --   --   --   ALT 16  --   --   --   ALKPHOS 139*  --   --   --   BILITOT 0.5  --   --   --   PROT 7.7  --   --   --   ALBUMIN 3.5 3.4* 3.3* 3.3*   Recent Labs  Lab 07/15/18 1643  LIPASE 54*   No results for input(s): AMMONIA in the last 168 hours.  Coagulation Profile: Recent Labs  Lab 07/18/18 1505  INR 1.12    Cardiac Enzymes: No results for input(s): CKTOTAL, CKMB, CKMBINDEX, TROPONINI in the last 168 hours.  BNP (last 3 results) No results for input(s): PROBNP in the last 8760 hours.  HbA1C: No results for input(s): HGBA1C in the last 72 hours.  CBG: Recent Labs  Lab 07/19/18 1703 07/19/18 2151 07/20/18 0812 07/20/18 1149 07/20/18 1649  GLUCAP 263* 274* 263* 335* 192*    Lipid Profile: No results for input(s): CHOL, HDL, LDLCALC, TRIG, CHOLHDL, LDLDIRECT in the last 72 hours.  Thyroid Function Tests: No results for input(s): TSH, T4TOTAL, FREET4, T3FREE, THYROIDAB in the last 72 hours.  Anemia Panel: No results for input(s): VITAMINB12, FOLATE, FERRITIN, TIBC, IRON, RETICCTPCT in the last 72 hours.  Urine analysis:    Component Value Date/Time   COLORURINE YELLOW 12/15/2010 2309   APPEARANCEUR  CLEAR 12/15/2010 2309   LABSPEC 1.021 12/15/2010 2309   PHURINE 6.5 12/15/2010 2309   GLUCOSEU 500 (A) 12/15/2010 2309   HGBUR LARGE (A) 12/15/2010 2309   BILIRUBINUR NEGATIVE 12/15/2010 2309   KETONESUR NEGATIVE 12/15/2010 2309   PROTEINUR >300 (A)  12/15/2010 2309   UROBILINOGEN 0.2 12/15/2010 2309   NITRITE NEGATIVE 12/15/2010 2309   LEUKOCYTESUR NEGATIVE 12/15/2010 2309    Sepsis Labs: Lactic Acid, Venous    Component Value Date/Time   LATICACIDVEN 1.63 07/15/2018 2006    MICROBIOLOGY: Recent Results (from the past 240 hour(s))  Culture, blood (routine x 2)     Status: Abnormal (Preliminary result)   Collection Time: 07/15/18  4:43 PM  Result Value Ref Range Status   Specimen Description   Final    BLOOD BLOOD LEFT FOREARM Performed at [facility], [place] [address] [place], [place] [address]    Special Requests   Final    BOTTLES DRAWN AEROBIC AND ANAEROBIC Blood Culture adequate volume Performed at [place] [address] [place], [place] [address]    Culture  Setup Time   Final    AEROBIC BOTTLE ONLY GRAM POSITIVE RODS CRITICAL RESULT CALLED TO, READ BACK BY AND VERIFIED WITH: Karsten Ro [facility] 07/19/18 5176 JDW Performed at [facility], [place] [address], [place], [place] [address]    Culture DIPHTHEROIDS(CORYNEBACTERIUM SPECIES) (A)  Final   Report Status PENDING  Incomplete  Culture, blood (routine x 2)     Status: Abnormal   Collection Time: 07/15/18  4:47 PM  Result Value Ref Range Status   Specimen Description   Final    BLOOD LEFT ANTECUBITAL Performed at [place] [address], [place], [place] [address]    Special Requests   Final    BOTTLES DRAWN AEROBIC AND ANAEROBIC Blood Culture adequate volume Performed at [place] [address], [place], [place] [address]    Culture  Setup Time   Final    GRAM POSITIVE COCCI IN CLUSTERS ANAEROBIC BOTTLE ONLY CRITICAL RESULT CALLED TO, READ BACK BY AND VERIFIED WITH: Jene Every PharmD 15:15 07/16/18 ([email or web address])    Culture (A)  Final    STAPHYLOCOCCUS SPECIES (COAGULASE NEGATIVE) THE SIGNIFICANCE OF ISOLATING THIS ORGANISM FROM A SINGLE SET OF BLOOD CULTURES WHEN MULTIPLE  SETS ARE DRAWN IS UNCERTAIN. PLEASE NOTIFY THE MICROBIOLOGY DEPARTMENT WITHIN ONE WEEK IF SPECIATION AND SENSITIVITIES ARE REQUIRED. Performed at [facility], [place] [address] [place],  [address]    Report Status 07/19/2018 FINAL  Final  Blood Culture ID Panel (Reflexed)     Status: Abnormal   Collection Time: 07/15/18  4:47 PM  Result Value Ref Range Status   Enterococcus species NOT DETECTED NOT DETECTED Final   Listeria monocytogenes NOT DETECTED NOT DETECTED Final   Staphylococcus species DETECTED (A) NOT DETECTED Final    Comment: Methicillin (oxacillin) susceptible coagulase negative staphylococcus. Possible blood culture contaminant (unless isolated from more than one blood culture draw or clinical case suggests pathogenicity). No antibiotic treatment is indicated for blood  culture contaminants. CRITICAL RESULT CALLED TO, READ BACK BY AND VERIFIED WITH: Jene Every PharmD 15:15 07/16/18 ([email or web address])    Staphylococcus aureus NOT DETECTED NOT DETECTED Final   Methicillin resistance NOT DETECTED NOT DETECTED Final   Streptococcus species NOT DETECTED NOT DETECTED Final   Streptococcus agalactiae NOT DETECTED NOT DETECTED Final   Streptococcus pneumoniae NOT DETECTED NOT DETECTED Final   Streptococcus pyogenes  NOT DETECTED NOT DETECTED Final   Acinetobacter baumannii NOT DETECTED NOT DETECTED Final   Enterobacteriaceae species NOT DETECTED NOT DETECTED Final   Enterobacter cloacae complex NOT DETECTED NOT DETECTED Final   Escherichia coli NOT DETECTED NOT DETECTED Final   Klebsiella oxytoca NOT DETECTED NOT DETECTED Final   Klebsiella pneumoniae NOT DETECTED NOT DETECTED Final   Proteus species NOT DETECTED NOT DETECTED Final   Serratia marcescens NOT DETECTED NOT DETECTED Final   Haemophilus influenzae NOT DETECTED NOT DETECTED Final   Neisseria meningitidis NOT DETECTED NOT DETECTED Final   Pseudomonas aeruginosa NOT DETECTED NOT DETECTED Final   Candida albicans NOT  DETECTED NOT DETECTED Final   Candida glabrata NOT DETECTED NOT DETECTED Final   Candida krusei NOT DETECTED NOT DETECTED Final   Candida parapsilosis NOT DETECTED NOT DETECTED Final   Candida tropicalis NOT DETECTED NOT DETECTED Final    Comment: Performed at [facility], [place] [address], [place], [place] [address]  Aerobic/Anaerobic Culture (surgical/deep wound)     Status: None (Preliminary result)   Collection Time: 07/15/18  8:53 PM  Result Value Ref Range Status   Specimen Description   Final    WOUND BACK Performed at [place] [address], [place], [place] [address]    Special Requests   Final    NONE Performed at [facility], [place] [address], [place], [place] [address]    Gram Stain   Final    RARE WBC PRESENT, PREDOMINANTLY PMN RARE GRAM POSITIVE COCCI Performed at [facility], [place] [address] [place], [place] [address]    Culture   Final    RARE DIPHTHEROIDS(CORYNEBACTERIUM SPECIES) NO ANAEROBES ISOLATED; CULTURE IN PROGRESS FOR 5 DAYS    Report Status PENDING  Incomplete  MRSA PCR Screening     Status: None   Collection Time: 07/16/18  3:20 AM  Result Value Ref Range Status   MRSA by PCR NEGATIVE NEGATIVE Final    Comment:        The GeneXpert MRSA Assay (FDA approved for NASAL specimens only), is one component of a comprehensive MRSA colonization surveillance program. It is not intended to diagnose MRSA infection nor to guide or monitor treatment for MRSA infections. Performed at [facility], [place] [address] [place],  [address]     RADIOLOGY STUDIES/RESULTS: Ct Abdomen Pelvis Wo Contrast  Result Date: 07/15/2018 CLINICAL DATA:  52 y/o M; right lower quadrant abdominal pain radiating to the left flank for 2 months. Reports mass to the right abdomen. EXAM: CT ABDOMEN AND PELVIS WITHOUT CONTRAST TECHNIQUE: Multidetector CT imaging of the abdomen and pelvis was performed following the  standard protocol without IV contrast. COMPARISON:  12/16/2010 CT abdomen and pelvis. FINDINGS: Lower chest: Severe coronary artery calcific atherosclerosis and dense mitral annular calcification. Right central venous catheter tip projects over the right atrium. 2-3 mm nodules at the lung bases, some obscured by atelectasis on the prior CT. Hepatobiliary: No focal liver abnormality is seen. No gallstones, gallbladder wall thickening, or biliary dilatation. Pancreas: Unremarkable. No pancreatic ductal dilatation or surrounding inflammatory changes. Spleen: Normal in size without focal abnormality. Adrenals/Urinary Tract: Adrenal glands are unremarkable. Atrophic kidneys bilaterally. Kidneys are otherwise normal, without renal calculi, focal lesion, or hydronephrosis. Bladder is unremarkable. Stomach/Bowel: Stomach is within normal limits. Appendix appears normal. No evidence of bowel wall thickening, distention, or inflammatory changes. Vascular/Lymphatic: Aortic atherosclerosis. No enlarged abdominal or pelvic lymph nodes. Reproductive: Prostate is unremarkable.  Other: No abdominal wall hernia or abnormality. No abdominopelvic ascites. Musculoskeletal: New L2 vertebral body subcentimeter lucent foci (series 4, image 90). No acute fracture. Increased density of bones. IMPRESSION: 1. No acute process identified. 2. 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 Differential includes lytic metastasis or myeloma. No direct connection to the disc space identified to suggest Schmorl's node. Follow-up recommended to ensure stability. 3. Severe coronary and aortic calcific atherosclerosis. Electronically Signed   By: Kristine Garbe M.D.   On: 07/15/2018 17:46   Dg Chest 2 View  Result Date: 07/15/2018 CLINICAL DATA:  Sepsis.  Right lower quadrant pain. EXAM: CHEST - 2 VIEW COMPARISON:  12/16/2010 FINDINGS: There is a right jugular dialysis  catheter with the tip extending into the right atrium. Low lung volumes. Left basilar densities are most compatible with atelectasis/scar and may be chronic. Cardiac silhouette appears to be within normal limits and stable but poorly characterized on this low lung volume examination. Negative for pneumothorax. No significant airspace disease or pulmonary edema. No large pleural effusions. IMPRESSION: Low lung volumes without acute findings. Presence of a dialysis catheter. Electronically Signed   By: Markus Daft M.D.   On: 07/15/2018 17:07   Ir Fluoro Guide Cv Line Left  Result Date: 07/19/2018 INDICATION: 52 year old male with end-stage renal disease on hemodialysis. He was previously dialyzing via a right IJ approach tunneled hemodialysis catheter, however he presented with bacteremia and the catheter was removed yesterday. He has now been nearly 24 hours on a line holiday but requires dialysis today. He presents for placement of a new left-sided tunneled hemodialysis catheter. EXAM: TUNNELED CENTRAL VENOUS HEMODIALYSIS CATHETER PLACEMENT WITH ULTRASOUND AND FLUOROSCOPIC GUIDANCE MEDICATIONS: 2 g Ancef. The antibiotic was given in an appropriate time interval prior to skin puncture. ANESTHESIA/SEDATION: Moderate (conscious) sedation was employed during this procedure. A total of Versed 2 mg and Fentanyl 100 mcg was administered intravenously. Moderate Sedation Time: 23 minutes. The patient's level of consciousness and vital signs were monitored continuously by radiology nursing throughout the procedure under my direct supervision. FLUOROSCOPY TIME:  Fluoroscopy Time: 0 minutes 54 seconds (10 mGy). COMPLICATIONS: None immediate. PROCEDURE: Informed written consent was obtained from the patient after a discussion of the risks, benefits, and alternatives to treatment. Questions regarding the procedure were encouraged and answered. The left neck and chest were prepped with chlorhexidine in a sterile fashion, and a  sterile drape was applied covering the operative field. Maximum barrier sterile technique with sterile gowns and gloves were used for the procedure. A timeout was performed prior to the initiation of the procedure. After creating a small venotomy incision, a micropuncture kit was utilized to access the left internal jugular vein under direct, real-time ultrasound guidance after the overlying soft tissues were anesthetized with 1% lidocaine with epinephrine. Ultrasound image documentation was performed. The microwire was kinked to measure appropriate catheter length. A stiff Glidewire was advanced to the level of the IVC and the micropuncture sheath was exchanged for a peel-away sheath. A palindrome tunneled hemodialysis catheter measuring 23 cm from tip to cuff was tunneled in a retrograde fashion from the anterior chest wall to the venotomy incision. The catheter was then placed through the peel-away sheath with tips ultimately positioned within the superior aspect of the right atrium. Final catheter positioning was confirmed and documented with a spot radiographic image. The catheter aspirates and flushes normally. The catheter was flushed with appropriate volume heparin dwells. The catheter exit  site was secured with a 0-Prolene retention suture. The venotomy incision was closed with Dermabond. Dressings were applied. The patient tolerated the procedure well without immediate post procedural complication. IMPRESSION: Successful placement of 23 cm tip to cuff tunneled hemodialysis catheter via the left internal jugular vein with tips terminating within the superior aspect of the right atrium. The catheter is ready for immediate use. Electronically Signed   By: Jacqulynn Cadet M.D.   On: 07/19/2018 11:07   Ir Removal Tun Cv Cath W/o Fl  Result Date: 07/18/2018 INDICATION: Request for removal of tunneled HD catheter which was placed approximately 3 years ago at [facility] hospital in [place]. Patient admitted to [facility]  for bacteremia - request to remove tunneled HD catheter today as possible infectious source with plans to replace tomorrow. EXAM: REMOVAL TUNNELED CENTRAL VENOUS CATHETER MEDICATIONS: 10 mL 2% lidocaine. ANESTHESIA/SEDATION: None. FLUOROSCOPY TIME:  None. COMPLICATIONS: None immediate. PROCEDURE: Informed written consent was obtained from the patient after a thorough discussion of the procedural risks, benefits and alternatives. All questions were addressed. Maximal Sterile Barrier Technique was utilized including mask, sterile gloves, sterile drape, hand hygiene and skin antiseptic. A timeout was performed prior to the initiation of the procedure. The patient's right chest and catheter was prepped and draped in a normal sterile fashion. Heparin was removed from both ports of catheter. 1% lidocaine was used for local anesthesia. Using gentle blunt dissection the cuff of the catheter was exposed and the catheter was removed in it's entirety. Pressure was held until hemostasis was obtained. A sterile dressing was applied. The patient tolerated the procedure well with no immediate complications. IMPRESSION: Successful catheter removal as described above. Read by Candiss Norse, PA-C Electronically Signed   By: Jacqulynn Cadet M.D.   On: 07/18/2018 14:32   Ir US Guide Vasc Access Left  Result Date: 07/19/2018 INDICATION: 52 year old male with end-stage renal disease on hemodialysis. He was previously dialyzing via a right IJ approach tunneled hemodialysis catheter, however he presented with bacteremia and the catheter was removed yesterday. He has now been nearly 24 hours on a line holiday but requires dialysis today. He presents for placement of a new left-sided tunneled hemodialysis catheter. EXAM: TUNNELED CENTRAL VENOUS HEMODIALYSIS CATHETER PLACEMENT WITH ULTRASOUND AND FLUOROSCOPIC GUIDANCE MEDICATIONS: 2 g Ancef. The antibiotic was given in an appropriate time interval prior to skin puncture.  ANESTHESIA/SEDATION: Moderate (conscious) sedation was employed during this procedure. A total of Versed 2 mg and Fentanyl 100 mcg was administered intravenously. Moderate Sedation Time: 23 minutes. The patient's level of consciousness and vital signs were monitored continuously by radiology nursing throughout the procedure under my direct supervision. FLUOROSCOPY TIME:  Fluoroscopy Time: 0 minutes 54 seconds (10 mGy). COMPLICATIONS: None immediate. PROCEDURE: Informed written consent was obtained from the patient after a discussion of the risks, benefits, and alternatives to treatment. Questions regarding the procedure were encouraged and answered. The left neck and chest were prepped with chlorhexidine in a sterile fashion, and a sterile drape was applied covering the operative field. Maximum barrier sterile technique with sterile gowns and gloves were used for the procedure. A timeout was performed prior to the initiation of the procedure. After creating a small venotomy incision, a micropuncture kit was utilized to access the left internal jugular vein under direct, real-time ultrasound guidance after the overlying soft tissues were anesthetized with 1% lidocaine with epinephrine. Ultrasound image documentation was performed. The microwire was kinked to measure appropriate catheter length. A stiff Glidewire was advanced to the level  of the IVC and the micropuncture sheath was exchanged for a peel-away sheath. A palindrome tunneled hemodialysis catheter measuring 23 cm from tip to cuff was tunneled in a retrograde fashion from the anterior chest wall to the venotomy incision. The catheter was then placed through the peel-away sheath with tips ultimately positioned within the superior aspect of the right atrium. Final catheter positioning was confirmed and documented with a spot radiographic image. The catheter aspirates and flushes normally. The catheter was flushed with appropriate volume heparin dwells. The  catheter exit site was secured with a 0-Prolene retention suture. The venotomy incision was closed with Dermabond. Dressings were applied. The patient tolerated the procedure well without immediate post procedural complication. IMPRESSION: Successful placement of 23 cm tip to cuff tunneled hemodialysis catheter via the left internal jugular vein with tips terminating within the superior aspect of the right atrium. The catheter is ready for immediate use. Electronically Signed   By: Jacqulynn Cadet M.D.   On: 07/19/2018 11:07   Dg Abd 2 Views  Result Date: 07/17/2018 CLINICAL DATA:  Abdominal pain.  LEFT lower quadrant pain. EXAM: ABDOMEN - 2 VIEW COMPARISON:  None. FINDINGS: Suboptimal images due to body habitus. The bowel gas pattern is normal. There is no evidence of free air. No radio-opaque calculi or other significant radiographic abnormality is seen. IMPRESSION: Grossly negative flat and upright abdominal radiographs. Electronically Signed   By: Staci Righter M.D.   On: 07/17/2018 15:26     LOS: 5 days   Cristal Deer, MD  [facility]  If 7PM-7AM, please contact night-coverage  Please page via [email or web address]   07/20/2018, 6:36 PM

## 2018-07-21 ENCOUNTER — Telehealth: Payer: Self-pay

## 2018-07-21 DIAGNOSIS — L02212 Cutaneous abscess of back [any part, except buttock]: Principal | ICD-10-CM

## 2018-07-21 DIAGNOSIS — R7881 Bacteremia: Secondary | ICD-10-CM

## 2018-07-21 DIAGNOSIS — R1031 Right lower quadrant pain: Secondary | ICD-10-CM

## 2018-07-21 DIAGNOSIS — F17290 Nicotine dependence, other tobacco product, uncomplicated: Secondary | ICD-10-CM

## 2018-07-21 DIAGNOSIS — Z794 Long term (current) use of insulin: Secondary | ICD-10-CM

## 2018-07-21 DIAGNOSIS — I10 Essential (primary) hypertension: Secondary | ICD-10-CM

## 2018-07-21 DIAGNOSIS — B9689 Other specified bacterial agents as the cause of diseases classified elsewhere: Secondary | ICD-10-CM

## 2018-07-21 DIAGNOSIS — Z992 Dependence on renal dialysis: Secondary | ICD-10-CM

## 2018-07-21 DIAGNOSIS — Z880 Allergy status to penicillin: Secondary | ICD-10-CM

## 2018-07-21 DIAGNOSIS — T827XXA Infection and inflammatory reaction due to other cardiac and vascular devices, implants and grafts, initial encounter: Secondary | ICD-10-CM

## 2018-07-21 DIAGNOSIS — Z89511 Acquired absence of right leg below knee: Secondary | ICD-10-CM

## 2018-07-21 DIAGNOSIS — I120 Hypertensive chronic kidney disease with stage 5 chronic kidney disease or end stage renal disease: Secondary | ICD-10-CM

## 2018-07-21 DIAGNOSIS — L0291 Cutaneous abscess, unspecified: Secondary | ICD-10-CM

## 2018-07-21 DIAGNOSIS — Z91013 Allergy to seafood: Secondary | ICD-10-CM

## 2018-07-21 DIAGNOSIS — E1122 Type 2 diabetes mellitus with diabetic chronic kidney disease: Secondary | ICD-10-CM

## 2018-07-21 DIAGNOSIS — N186 End stage renal disease: Secondary | ICD-10-CM

## 2018-07-21 LAB — CULTURE, BLOOD (ROUTINE X 2): SPECIAL REQUESTS: ADEQUATE

## 2018-07-21 LAB — CBC
HCT: 34.2 % — ABNORMAL LOW (ref 39.0–52.0)
Hemoglobin: 10.9 g/dL — ABNORMAL LOW (ref 13.0–17.0)
MCH: 29.5 pg (ref 26.0–34.0)
MCHC: 31.9 g/dL (ref 30.0–36.0)
MCV: 92.7 fL (ref 78.0–100.0)
Platelets: 167 10*3/uL (ref 150–400)
RBC: 3.69 MIL/uL — ABNORMAL LOW (ref 4.22–5.81)
RDW: 14.2 % (ref 11.5–15.5)
WBC: 8.2 10*3/uL (ref 4.0–10.5)

## 2018-07-21 LAB — GLUCOSE, CAPILLARY
GLUCOSE-CAPILLARY: 271 mg/dL — AB (ref 70–99)
GLUCOSE-CAPILLARY: 276 mg/dL — AB (ref 70–99)
Glucose-Capillary: 251 mg/dL — ABNORMAL HIGH (ref 70–99)
Glucose-Capillary: 312 mg/dL — ABNORMAL HIGH (ref 70–99)
Glucose-Capillary: 307 mg/dL — ABNORMAL HIGH (ref 70–99)

## 2018-07-21 LAB — RENAL FUNCTION PANEL
Albumin: 3.4 g/dL — ABNORMAL LOW (ref 3.5–5.0)
Anion gap: 19 — ABNORMAL HIGH (ref 5–15)
BUN: 68 mg/dL — ABNORMAL HIGH (ref 6–20)
CO2: 23 mmol/L (ref 22–32)
Calcium: 9.4 mg/dL (ref 8.9–10.3)
Chloride: 94 mmol/L — ABNORMAL LOW (ref 98–111)
Creatinine, Ser: 12.78 mg/dL — ABNORMAL HIGH (ref 0.61–1.24)
GFR calc Af Amer: 5 mL/min — ABNORMAL LOW (ref >60)
GFR calc non Af Amer: 4 mL/min — ABNORMAL LOW (ref >60)
Glucose, Bld: 250 mg/dL — ABNORMAL HIGH (ref 70–99)
Phosphorus: 9.7 mg/dL — ABNORMAL HIGH (ref 2.5–4.6)
Potassium: 5.4 mmol/L — ABNORMAL HIGH (ref 3.5–5.1)
Sodium: 136 mmol/L (ref 135–145)

## 2018-07-21 LAB — AEROBIC/ANAEROBIC CULTURE W GRAM STAIN (SURGICAL/DEEP WOUND)

## 2018-07-21 LAB — AEROBIC/ANAEROBIC CULTURE (SURGICAL/DEEP WOUND)

## 2018-07-21 MED ORDER — SODIUM CHLORIDE 0.9 % IV SOLN: 100.0000 mL | INTRAVENOUS | Status: DC | PRN

## 2018-07-21 MED ORDER — INSULIN GLARGINE 100 UNIT/ML ~~LOC~~ SOLN
10.0000 [IU] | Freq: Every day | SUBCUTANEOUS | Status: DC
  Administered 2018-07-21: 10 [IU] via SUBCUTANEOUS
  Filled 2018-07-21: qty 0.1

## 2018-07-21 MED ORDER — CEFAZOLIN SODIUM-DEXTROSE 2-4 GM/100ML-% IV SOLN: 2.0000 g | INTRAVENOUS | Status: DC

## 2018-07-21 MED ORDER — HEPARIN SODIUM (PORCINE) 1000 UNIT/ML DIALYSIS: 5000.0000 [IU] | Freq: Once | INTRAMUSCULAR | Status: DC

## 2018-07-21 MED ORDER — PENTAFLUOROPROP-TETRAFLUOROETH EX AERO: 1.0000 "application " | INHALATION_SPRAY | CUTANEOUS | Status: DC | PRN

## 2018-07-21 MED ORDER — HEPARIN SODIUM (PORCINE) 1000 UNIT/ML DIALYSIS: 1000.0000 [IU] | INTRAMUSCULAR | Status: DC | PRN

## 2018-07-21 MED ORDER — LIDOCAINE HCL (PF) 1 % IJ SOLN: 5.0000 mL | INTRAMUSCULAR | Status: DC | PRN

## 2018-07-21 MED ORDER — LIDOCAINE-PRILOCAINE 2.5-2.5 % EX CREA: 1.0000 "application " | TOPICAL_CREAM | CUTANEOUS | Status: DC | PRN

## 2018-07-21 MED ORDER — ALTEPLASE 2 MG IJ SOLR: 2.0000 mg | Freq: Once | INTRAMUSCULAR | Status: DC | PRN

## 2018-07-21 NOTE — Progress Notes (Signed)
Inpatient Diabetes Program Recommendations  AACE/ADA: New Consensus Statement on Inpatient Glycemic Control (2015)  Target Ranges:  Prepandial:   less than 140 mg/dL      Peak postprandial:   less than 180 mg/dL (1-2 hours)      Critically ill patients:  140 - 180 mg/dL   Review of Glycemic Control  Diabetes history: DM 2 Outpatient Diabetes medications: Lantus 5 units Daily, Novolog 0-6 units tid Current orders for Inpatient glycemic control: Lantus 8 units Daily, Novolog 0-15 units tid  Inpatient Diabetes Program Recommendations:    Glucose trends elevated still. Consider increasing Lantus to 12 units.    Thanks,  Tama Headings RN, MSN, BC-ADM Inpatient Diabetes Coordinator Team Pager [phone] (8a-5p)

## 2018-07-21 NOTE — Progress Notes (Signed)
[facility] Progress Note   Subjective: very talkative.   Objective Vitals:   07/21/18 0930 07/21/18 1000 07/21/18 1030 07/21/18 1055  BP: 130/78 112/68 (!) 154/88 133/72  Pulse: 80 78 86 87  Resp:    18  Temp:    98.3 F (36.8 C)  TempSrc:    Oral  SpO2:    96%  Weight:    127.8 kg (281 lb 12 oz)  Height:       Physical Exam General: Obese pleasant, NAD Heart: HD distant, S1,S2, RRR Lungs: CTAB slightly decreased LL base.  Abdomen: Obese, still with tenderness RLQ Extremities: R BKA with prosthesis, LLE with trace-1+ edema in spite of compression house.  Dialysis Access: LIJ TDC placed 07/19/2018   Additional Objective Labs: Basic Metabolic Panel: Recent Labs  Lab 07/18/18 0625 07/19/18 0415 07/21/18 0815  NA 135 135 136  K 5.3* 5.3* 5.4*  CL 94* 94* 94*  CO2 25 26 23   GLUCOSE 326* 309* 250*  BUN 39* 52* 68*  CREATININE 8.97* 10.81* 12.78*  CALCIUM 9.3 9.4 9.4  PHOS 7.8* 9.3* 9.7*   Liver Function Tests: Recent Labs  Lab 07/15/18 1643  07/18/18 0625 07/19/18 0415 07/21/18 0815  AST 23  --   --   --   --   ALT 16  --   --   --   --   ALKPHOS 139*  --   --   --   --   BILITOT 0.5  --   --   --   --   PROT 7.7  --   --   --   --   ALBUMIN 3.5   < > 3.3* 3.3* 3.4*   < > = values in this interval not displayed.   Recent Labs  Lab 07/15/18 1643  LIPASE 54*   CBC: Recent Labs  Lab 07/15/18 1643 07/17/18 0625 07/17/18 0828 07/18/18 0625 07/21/18 0815  WBC 6.9 9.3 6.1 6.9 8.2  NEUTROABS 4.8  --   --   --   --   HGB 12.3* 6.7* 11.3* 11.2* 10.9*  HCT 36.1* 21.3* 34.9* 35.0* 34.2*  MCV 87.4 92.6 90.9 91.9 92.7  PLT 262 281 219 192 167   Blood Culture    Component Value Date/Time   SDES  07/15/2018 2053    WOUND BACK Performed at [facility], [place] [address], [place], [place] [address]    SPECREQUEST  07/15/2018 2053    NONE Performed at [facility], [place] [address], [place],  [place] [address]    CULT  07/15/2018 2053    RARE DIPHTHEROIDS(CORYNEBACTERIUM SPECIES) NO ANAEROBES ISOLATED Performed at [facility] [address] [place], [place] [address]    REPTSTATUS 07/21/2018 FINAL 07/15/2018 2053    Cardiac Enzymes: No results for input(s): CKTOTAL, CKMB, CKMBINDEX, TROPONINI in the last 168 hours. CBG: Recent Labs  Lab 07/20/18 1149 07/20/18 1649 07/20/18 2011 07/21/18 0526 07/21/18 1040  GLUCAP 335* 192* 230* 271* 251*   Iron Studies: No results for input(s): IRON, TIBC, TRANSFERRIN, FERRITIN in the last 72 hours. @lablastinr3 @ Studies/Results: No results found. Medications: . sodium chloride    . sodium chloride    .  ceFAZolin (ANCEF) IV 1 g (07/20/18 2132)  . [START ON 07/22/2018]  ceFAZolin (ANCEF) IV     . amLODipine  5 mg Oral Daily  . Chlorhexidine Gluconate Cloth  6 each Topical Q0600  . feeding supplement (NEPRO CARB STEADY)  237 mL Oral BID BM  . gabapentin  300 mg Oral QHS  . heparin  5,000 Units Subcutaneous Q8H  . heparin  5,000 Units Dialysis Once in dialysis  . hydrALAZINE  50 mg Oral Q8H  . insulin aspart  0-15 Units Subcutaneous TID WC  . insulin glargine  8 Units Subcutaneous QHS  . levothyroxine  25 mcg Oral QAC breakfast  . lidocaine  2 patch Transdermal Q24H  . metoprolol tartrate  100 mg Oral BID  . multivitamin  1 tablet Oral QHS  . ondansetron (ZOFRAN) IV  4 mg Intravenous Once  . pantoprazole  40 mg Oral Q0600  . polyethylene glycol  17 g Oral Daily  . senna  2 tablet Oral QHS  . sevelamer carbonate  2,400 mg Oral TID WC     Dialysis Orders: TTS [facility] [phone] 4.5h   279lb/ 127.5kg  Prosthetic weighs 33Lb/ 15kg   TDC (removed/ replaced here)   Assessment/Plan: 1. Bacteremia + MSSE-1/2 BC, IR removed catheter 8/2 and replaced per IR 07/19/18. Continue Ancef. Will require total of 3  weeks of Ancef.  Needs ECHO and f/u blood cx's here per primary MD.   2. ESRD -TTS.  On HD now for extra HD  session. Plan regular HD tomorrow, poss run short 3 hrs tomorrow, to get back on sched. 3. Anemia - hgb 11.2- no meds  4. Secondary hyperparathyroidism - on renvela. Phos 9.3 Ca 9.4 C Ca 10.0. Increase binders.  5. HTN/volume - +2kg today pre HD 6. Nutrition - Albumin 3.3 change to renal carb mod diet + vits add nepro.  7. Abscess  Back s/p I and D - on Ancef gram + cocci 8. 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 Started on gabapentin and lidoderm patch. Chronic issue  9. DM - per primary 10  New L2 foci- need re image ?  Other work up ?  Per primary    Kelly Splinter MD [facility] pgr [phone]   07/21/2018, 11:50 AM

## 2018-07-21 NOTE — Progress Notes (Signed)
PROGRESS NOTE  David Walsh Post  HDQ:[id number] DOB: 25-Sep-1966 DOA: 07/15/2018 [facility]   Brief Narrative: David Walsh is a 52 y.o. male with a history of ESRD (TTS), right BKA, T2DM, HTN, and chronic abdominal pain who presented to the ED with an upper back abscess not responsive to outpatient antibiotics. I&D was performed in the ED and he was admitted on IV antibiotics. Blood cultures had been drawn and grew CoNS in 1 of 4 bottles and diphtheroids in a different of 4 bottles. Due to concern for line involvement, his TDC was removed 8/2 and dialysis catheter replaced on 8/3. He has continued on ancef. ID was consulted, recommended TTE to inform duration of antibiotics prior to discharge.  Assessment & Plan: Principal Problem:   Abscess Active Problems:   DM (diabetes mellitus) (HCC)   HTN (hypertension)   ESRD (end stage renal disease) (HCC)   Abdominal pain   Hx of BKA, right (HCC)  Abscess and cellulitis: On upper back. No discharge on today's exam.  - Was on vancomycin, now ancef. Will continue  for possible bacteremia as below. Definitive Tx of abscess with I&D on admission.  CoNS bacteremia:  - Repeat blood cultures now to document clearance - Echocardiogram ordered to evaluate for endocarditis. - Continue ancef. Dosing in ESRD would be amenable to outpatient Tx at HD.  Diphtheroids in 1 of 4 blood cultures: - Per ID.   ESRD:  - Continue routine HD through new TDC inserted 8/3 per nephrology  T2DM:  - Continue basal-bolus insulin AC/HS  Chronic abdominal pain: GI consulted, felt neuropathy was a possible explanation. No acute findings on CT abd/pelvis, has reportedly normal colo last year with the [place].  - Continue gabapentin, lidoderm patch, bowel regimen - Has follow up with Dr. Havery Moros 9/19.  HTN:  - Continue home medications   Hypothyroidism:  - Continue stable dose synthroid  DVT prophylaxis: Heparin Code Status: Full Family Communication: None at  bedside Disposition Plan: Home pending results of echocardiogram  Consultants:   Nephrology  IR  Infectious Diseases  Procedures:   8/3 Left IJ double lumen tunneled hemodialysis catheter placement.  Antimicrobials:  Vancomycin given 7/30, 8/1  Levaquin 7/30  Ceftriaxone 7/30  Aztreonam 7/30  Ancef 8/2 >>   Subjective: Abdominal pain unchanged. No fevers. Denies new pains.  Objective: Vitals:   07/21/18 1030 07/21/18 1055 07/21/18 1154 07/21/18 1623  BP: (!) 154/88 133/72 135/66 138/70  Pulse: 86 87 86 88  Resp:  18 18 18   Temp:  98.3 F (36.8 C)  98.4 F (36.9 C)  TempSrc:  Oral  Oral  SpO2:  96% 95% 96%  Weight:  127.8 kg (281 lb 12 oz)    Height:        Intake/Output Summary (Last 24 hours) at 07/21/2018 1654 Last data filed at 07/21/2018 1340 Gross per 24 hour  Intake 600 ml  Output 1706 ml  Net -1106 ml   Filed Weights   07/19/18 2154 07/21/18 0800 07/21/18 1055  Weight: 132.7 kg (292 lb 8 oz) 129.4 kg (285 lb 4.4 oz) 127.8 kg (281 lb 12 oz)    Gen: 52 y.o. male in no distress  Pulm: Non-labored breathing. Clear to auscultation bilaterally.  CV: Regular rate and rhythm. No murmur, rub, or gallop. No JVD, 1+ LLE edema. GI: Abdomen soft, non-tender, non-distended, with normoactive bowel sounds. No organomegaly or masses felt. Ext: Warm, right BKA prosthesis in place. Skin: Upper back with small induration right of midline with  c/d/i dressing, nontender, no spreading erythema or discharge. Right IJ TDC site appropriately tender, no discharge. Left IJ TDC nontender, no erythema or discharge. Neuro: Alert and oriented. No focal neurological deficits. Psych: Judgement and insight appear normal. Mood & affect appropriate.   Data Reviewed: I have personally reviewed following labs and imaging studies  CBC: Recent Labs  Lab 07/15/18 1643 07/17/18 0625 07/17/18 0828 07/18/18 0625 07/21/18 0815  WBC 6.9 9.3 6.1 6.9 8.2  NEUTROABS 4.8  --   --   --    --   HGB 12.3* 6.7* 11.3* 11.2* 10.9*  HCT 36.1* 21.3* 34.9* 35.0* 34.2*  MCV 87.4 92.6 90.9 91.9 92.7  PLT 262 281 219 192 010   Basic Metabolic Panel: Recent Labs  Lab 07/16/18 0920 07/16/18 1807 07/17/18 0625 07/18/18 0625 07/19/18 0415 07/21/18 0815  NA 133*  --  134* 135 135 136  K 4.9  --  5.5* 5.3* 5.3* 5.4*  CL 94*  --  93* 94* 94* 94*  CO2 25  --  22 25 26 23   GLUCOSE 258*  --  315* 326* 309* 250*  BUN 50*  --  63* 39* 52* 68*  CREATININE 9.56*  --  11.79* 8.97* 10.81* 12.78*  CALCIUM 9.5  --  9.4 9.3 9.4 9.4  PHOS  --  8.0* 9.6* 7.8* 9.3* 9.7*   GFR: Estimated Creatinine Clearance: 10.1 mL/min (A) (by C-G formula based on SCr of 12.78 mg/dL (H)). Liver Function Tests: Recent Labs  Lab 07/15/18 1643 07/17/18 0625 07/18/18 0625 07/19/18 0415 07/21/18 0815  AST 23  --   --   --   --   ALT 16  --   --   --   --   ALKPHOS 139*  --   --   --   --   BILITOT 0.5  --   --   --   --   PROT 7.7  --   --   --   --   ALBUMIN 3.5 3.4* 3.3* 3.3* 3.4*   Recent Labs  Lab 07/15/18 1643  LIPASE 54*   No results for input(s): AMMONIA in the last 168 hours. Coagulation Profile: Recent Labs  Lab 07/18/18 1505  INR 1.12   Cardiac Enzymes: No results for input(s): CKTOTAL, CKMB, CKMBINDEX, TROPONINI in the last 168 hours. BNP (last 3 results) No results for input(s): PROBNP in the last 8760 hours. HbA1C: No results for input(s): HGBA1C in the last 72 hours. CBG: Recent Labs  Lab 07/20/18 1649 07/20/18 2011 07/21/18 0526 07/21/18 1040 07/21/18 1151  GLUCAP 192* 230* 271* 251* 276*   Lipid Profile: No results for input(s): CHOL, HDL, LDLCALC, TRIG, CHOLHDL, LDLDIRECT in the last 72 hours. Thyroid Function Tests: No results for input(s): TSH, T4TOTAL, FREET4, T3FREE, THYROIDAB in the last 72 hours. Anemia Panel: No results for input(s): VITAMINB12, FOLATE, FERRITIN, TIBC, IRON, RETICCTPCT in the last 72 hours. Urine analysis:    Component Value Date/Time    COLORURINE YELLOW 12/15/2010 2309   APPEARANCEUR CLEAR 12/15/2010 2309   LABSPEC 1.021 12/15/2010 2309   PHURINE 6.5 12/15/2010 2309   GLUCOSEU 500 (A) 12/15/2010 2309   HGBUR LARGE (A) 12/15/2010 2309   BILIRUBINUR NEGATIVE 12/15/2010 2309   KETONESUR NEGATIVE 12/15/2010 2309   PROTEINUR >300 (A) 12/15/2010 2309   UROBILINOGEN 0.2 12/15/2010 2309   NITRITE NEGATIVE 12/15/2010 2309   LEUKOCYTESUR NEGATIVE 12/15/2010 2309   Recent Results (from the past 240 hour(s))  Culture, blood (routine x  2)     Status: Abnormal   Collection Time: 07/15/18  4:43 PM  Result Value Ref Range Status   Specimen Description   Final    BLOOD BLOOD LEFT FOREARM Performed at [facility] [address], [place], [place] [address]    Special Requests   Final    BOTTLES DRAWN AEROBIC AND ANAEROBIC Blood Culture adequate volume Performed at [facility] [address] [place], [place] [address]    Culture  Setup Time   Final    AEROBIC BOTTLE ONLY GRAM POSITIVE RODS CRITICAL RESULT CALLED TO, READ BACK BY AND VERIFIED WITH: Karsten Ro [facility] 07/19/18 6063 JDW Performed at [facility], [place] [address] [place], [place] [address]    Culture DIPHTHEROIDS(CORYNEBACTERIUM SPECIES) (A)  Final   Report Status 07/21/2018 FINAL  Final  Culture, blood (routine x 2)     Status: Abnormal   Collection Time: 07/15/18  4:47 PM  Result Value Ref Range Status   Specimen Description   Final    BLOOD LEFT ANTECUBITAL Performed at [facility] [address], [place], [place] [address]    Special Requests   Final    BOTTLES DRAWN AEROBIC AND ANAEROBIC Blood Culture adequate volume Performed at [facility] [address], [place], [place] [address]    Culture  Setup Time   Final    GRAM POSITIVE COCCI IN CLUSTERS ANAEROBIC BOTTLE ONLY CRITICAL RESULT CALLED TO, READ BACK BY AND VERIFIED WITH: Jene Every PharmD 15:15 07/16/18  ([email or web address])    Culture (A)  Final    STAPHYLOCOCCUS SPECIES (COAGULASE NEGATIVE) THE SIGNIFICANCE OF ISOLATING THIS ORGANISM FROM A SINGLE SET OF BLOOD CULTURES WHEN MULTIPLE SETS ARE DRAWN IS UNCERTAIN. PLEASE NOTIFY THE MICROBIOLOGY DEPARTMENT WITHIN ONE WEEK IF SPECIATION AND SENSITIVITIES ARE REQUIRED. Performed at [facility], [place] [address] [place], [place] [address]    Report Status 07/19/2018 FINAL  Final  Blood Culture ID Panel (Reflexed)     Status: Abnormal   Collection Time: 07/15/18  4:47 PM  Result Value Ref Range Status   Enterococcus species NOT DETECTED NOT DETECTED Final   Listeria monocytogenes NOT DETECTED NOT DETECTED Final   Staphylococcus species DETECTED (A) NOT DETECTED Final    Comment: Methicillin (oxacillin) susceptible coagulase negative staphylococcus. Possible blood culture contaminant (unless isolated from more than one blood culture draw or clinical case suggests pathogenicity). No antibiotic treatment is indicated for blood  culture contaminants. CRITICAL RESULT CALLED TO, READ BACK BY AND VERIFIED WITH: Jene Every PharmD 15:15 07/16/18 ([email or web address])    Staphylococcus aureus NOT DETECTED NOT DETECTED Final   Methicillin resistance NOT DETECTED NOT DETECTED Final   Streptococcus species NOT DETECTED NOT DETECTED Final   Streptococcus agalactiae NOT DETECTED NOT DETECTED Final   Streptococcus pneumoniae NOT DETECTED NOT DETECTED Final   Streptococcus pyogenes NOT DETECTED NOT DETECTED Final   Acinetobacter baumannii NOT DETECTED NOT DETECTED Final   Enterobacteriaceae species NOT DETECTED NOT DETECTED Final   Enterobacter cloacae complex NOT DETECTED NOT DETECTED Final   Escherichia coli NOT DETECTED NOT DETECTED Final   Klebsiella oxytoca NOT DETECTED NOT DETECTED Final   Klebsiella pneumoniae NOT DETECTED NOT DETECTED Final   Proteus species NOT DETECTED NOT DETECTED Final   Serratia marcescens NOT DETECTED NOT DETECTED Final   Haemophilus influenzae  NOT DETECTED NOT DETECTED Final   Neisseria meningitidis NOT DETECTED NOT DETECTED Final   Pseudomonas aeruginosa NOT DETECTED NOT  DETECTED Final   Candida albicans NOT DETECTED NOT DETECTED Final   Candida glabrata NOT DETECTED NOT DETECTED Final   Candida krusei NOT DETECTED NOT DETECTED Final   Candida parapsilosis NOT DETECTED NOT DETECTED Final   Candida tropicalis NOT DETECTED NOT DETECTED Final    Comment: Performed at [facility], [place] [address], [place], [place] [address]  Aerobic/Anaerobic Culture (surgical/deep wound)     Status: None   Collection Time: 07/15/18  8:53 PM  Result Value Ref Range Status   Specimen Description   Final    WOUND BACK Performed at [facility] [address], [place], [place] [address]    Special Requests   Final    NONE Performed at [facility], [place] [address], [place], [place] [address]    Gram Stain   Final    RARE WBC PRESENT, PREDOMINANTLY PMN RARE GRAM POSITIVE COCCI    Culture   Final    RARE DIPHTHEROIDS(CORYNEBACTERIUM SPECIES) NO ANAEROBES ISOLATED Performed at [facility], [place] [address], [place], [place] [address]    Report Status 07/21/2018 FINAL  Final  MRSA PCR Screening     Status: None   Collection Time: 07/16/18  3:20 AM  Result Value Ref Range Status   MRSA by PCR NEGATIVE NEGATIVE Final    Comment:        The GeneXpert MRSA Assay (FDA approved for NASAL specimens only), is one component of a comprehensive MRSA colonization surveillance program. It is not intended to diagnose MRSA infection nor to guide or monitor treatment for MRSA infections. Performed at [facility], [place] [address], [place],  [address]       Radiology Studies: No results found.  Scheduled Meds: . amLODipine  5 mg Oral Daily  . Chlorhexidine Gluconate Cloth  6 each Topical Q0600  . feeding supplement (NEPRO CARB STEADY)  237 mL Oral BID BM  . gabapentin  300 mg Oral QHS   . heparin  5,000 Units Subcutaneous Q8H  . hydrALAZINE  50 mg Oral Q8H  . insulin aspart  0-15 Units Subcutaneous TID WC  . insulin glargine  8 Units Subcutaneous QHS  . levothyroxine  25 mcg Oral QAC breakfast  . lidocaine  2 patch Transdermal Q24H  . metoprolol tartrate  100 mg Oral BID  . multivitamin  1 tablet Oral QHS  . ondansetron (ZOFRAN) IV  4 mg Intravenous Once  . pantoprazole  40 mg Oral Q0600  . polyethylene glycol  17 g Oral Daily  . senna  2 tablet Oral QHS  . sevelamer carbonate  2,400 mg Oral TID WC   Continuous Infusions: .  ceFAZolin (ANCEF) IV 1 g (07/21/18 1634)  . [START ON 07/22/2018]  ceFAZolin (ANCEF) IV       LOS: 6 days   Time spent: 25 minutes.  Patrecia Pour, MD [facility] [email or web address] Password [facility] 07/21/2018, 4:54 PM

## 2018-07-21 NOTE — Telephone Encounter (Signed)
-----   Message from Yetta Flock, MD sent at 07/18/2018  3:05 PM EDT ----- Regarding: outpatient follow up Almyra Free this patient will need routine outpatient follow up with me or APP in 1-2 months post discharge if you can help coordinate. He won't be out of the hospital until later next week. Thanks

## 2018-07-21 NOTE — Progress Notes (Signed)
Pharmacy Antibiotic Note  David Walsh is a 52 y.o. male admitted on 07/15/2018 with cellulitis/abscess.  Pharmacy now narrowing antibiotics for MSSE in blood cultures. Pharmacy has been consulted to narrow to Cefazolin.  ESRD on HD TTS with TDC removed 8/2 and replaced 8/3.  The patient received HD 8/3 but only 2 hours at a BFR 200-300. Planning an extra HD session today, 8/5 - then likely to discharge and get back on TTS outpatient. Renal also wanting to treat for 3 weeks - antibiotics were started on 7/30 - will add a stop date for 8/19  Plan: - Continue Cefazolin 1g IV every 24 hours thru 8/5 - Start Cefazolin 2g/HD-TTS starting on 8/6 - will add a stop date for 8/19 for 3 weeks as requested from renal MD - Will continue to follow HD schedule/duration, culture results, LOT, and antibiotic de-escalation plans   Height: 6\' 6"  (198.1 cm) Weight: 292 lb 8 oz (132.7 kg) IBW/kg (Calculated) : 91.4  Temp (24hrs), Avg:98.6 F (37 C), Min:98.1 F (36.7 C), Max:98.8 F (37.1 C)  Recent Labs  Lab 07/15/18 1643 07/15/18 1701 07/15/18 2006 07/16/18 0920 07/17/18 0625 07/17/18 0828 07/18/18 0625 07/19/18 0415  WBC 6.9  --   --   --  9.3 6.1 6.9  --   CREATININE 7.86*  --   --  9.56* 11.79*  --  8.97* 10.81*  LATICACIDVEN  --  1.94* 1.63  --   --   --   --   --     Estimated Creatinine Clearance: 12.2 mL/min (A) (by C-G formula based on SCr of 10.81 mg/dL (H)).    Allergies  Allergen Reactions  . Shellfish Allergy Anaphylaxis  . Penicillins Itching and Rash    Has patient had a PCN reaction causing immediate rash, facial/tongue/throat swelling, SOB or lightheadedness with hypotension: Yes Has patient had a PCN reaction causing severe rash involving mucus membranes or skin necrosis: No Has patient had a PCN reaction that required hospitalization: No Has patient had a PCN reaction occurring within the last 10 years: No If all of the above answers are "NO", then may proceed with  Cephalosporin use.    Azactam 7/30 x 1 LVQ 7/30 x 1 Vanc 7/30 >> 8/2 CTX 7/31 >>7/31 Cefazolin 8/2 >> (8/19)  7/30 BCx >> 1/2 MSSE + 1/2 diptheroids 7/30 Wound cx (I&D) >> diphtheroids 7/31 MRSA PCR >> negative  Thank you for allowing pharmacy to be a part of this patient's care.  Alycia Rossetti, PharmD, BCPS Clinical Pharmacist Pager: [phone] Clinical phone for 07/21/2018 from 7a-3:30p: [phone] If after 3:30p, please call main pharmacy at: [phone] Please check AMION for all [place] numbers 07/21/2018 8:04 AM

## 2018-07-21 NOTE — Consult Note (Signed)
David Walsh    Date of Admission:  07/15/2018   Total days of antibiotics 6         Day 4 Cefazolin                Reason for Consult: Bacteremia     Referring Provider: Bonner Puna Primary Care Provider: [facility]   Assessment: David Walsh is a 52 y.o. male with ESRD via tunneled catheter found to have 1/2 bottles with coagulase negative staph species and the other with corynebacterium. It is quite possible that these are both skin contaminants but with his risk of having an HD line could be related to this as well. He has had line removed and a new one now. Would check TTE and if negative would shorten course to 10 days of therapy.   Plan: 1. TTE  2. Pending TTE would treat with cefazolin 10 days total   Principal Problem:   Abscess Active Problems:   DM (diabetes mellitus) (HCC)   HTN (hypertension)   ESRD (end stage renal Walsh) ([place])   Abdominal pain   Hx of BKA, right ([place])   . amLODipine  5 mg Oral Daily  . Chlorhexidine Gluconate Cloth  6 each Topical Q0600  . feeding supplement (NEPRO CARB STEADY)  237 mL Oral BID BM  . gabapentin  300 mg Oral QHS  . heparin  5,000 Units Subcutaneous Q8H  . hydrALAZINE  50 mg Oral Q8H  . insulin aspart  0-15 Units Subcutaneous TID WC  . insulin glargine  8 Units Subcutaneous QHS  . levothyroxine  25 mcg Oral QAC breakfast  . lidocaine  2 patch Transdermal Q24H  . metoprolol tartrate  100 mg Oral BID  . multivitamin  1 tablet Oral QHS  . ondansetron (ZOFRAN) IV  4 mg Intravenous Once  . pantoprazole  40 mg Oral Q0600  . polyethylene glycol  17 g Oral Daily  . senna  2 tablet Oral QHS  . sevelamer carbonate  2,400 mg Oral TID WC    HPI: David Walsh is a 52 y.o. male with pmhx of DM, HTN, ESRD, R BKA, who presented to the ED on 7/30 with multiple concerns including RLQ pain x 19month, swelling at previous HD access site was as well as a back abscess x 69m duration. He was previously  given 2 rounds (at least) of antibiotics alone through the [place] and another outside hospital. Presented to the ER with tachycardia, mild tachypnea. Underwent I&D of back abscess in ED with purulent drainage, cultures growing corynebacterium. He had blood cultures drawn with hospital admission as well revealing 1/2 bottles with CoNS and 1/2 corynebacterium. He is currently getting cefazolin IV with HD and on day 6. His HD catheter was removed on 8/2 due to concern over seeding line and replaced on 8/3. Dr. Tommy Medal was consulted via telephone and discussed line removal and 2 weeks of IV therapy for presumed line related bacteremia with previous history of site swelling.   Review of Systems: Review of Systems  Constitutional: Negative for chills and fever.  HENT: Negative for congestion and tinnitus.   Eyes: Negative for blurred vision and photophobia.  Respiratory: Negative for cough and sputum production.   Cardiovascular: Negative for chest pain.  Gastrointestinal: Positive for abdominal pain. Negative for diarrhea, nausea and vomiting.  Genitourinary: Negative for dysuria.  Musculoskeletal: Negative for myalgias.  Skin: Negative for rash.  Previous swelling to previous catheter site per patient report, abscess on right back that has been drained.   Neurological: Negative for headaches.  Psychiatric/Behavioral: Negative for depression.    Past Medical History:  Diagnosis Date  . Anxiety   . Arthritis   . Diabetes mellitus without complication ([facility])   . Hx of BKA ([place])   . Neuropathy   . Renal disorder     Social History   Tobacco Use  . Smoking status: Current Some Day Smoker    Types: Cigars  . Smokeless tobacco: Never Used  Substance Use Topics  . Alcohol use: Not Currently  . Drug use: Not Currently    Family History  Problem Relation Age of Onset  . Arthritis Mother   . Heart attack Father   . Hypertension Sister    Allergies  Allergen Reactions  . Shellfish  Allergy Anaphylaxis  . Penicillins Itching and Rash    Has patient had a PCN reaction causing immediate rash, facial/tongue/throat swelling, SOB or lightheadedness with hypotension: Yes Has patient had a PCN reaction causing severe rash involving mucus membranes or skin necrosis: No Has patient had a PCN reaction that required hospitalization: No Has patient had a PCN reaction occurring within the last 10 years: No If all of the above answers are "NO", then may proceed with Cephalosporin use.     OBJECTIVE: Blood pressure 135/66, pulse 86, temperature 98.3 F (36.8 C), temperature source Oral, resp. rate 18, height 6\' 6"  (1.981 m), weight 281 lb 12 oz (127.8 kg), SpO2 95 %.  Physical Exam  Constitutional: He is oriented to person, place, and time. He appears well-developed and well-nourished.  HENT:  Mouth/Throat: No oral lesions. Normal dentition. No dental caries.  Eyes: No scleral icterus.  Cardiovascular: Normal rate, regular rhythm and normal heart sounds.  Pulmonary/Chest: Effort normal and breath sounds normal.  Abdominal: Soft. He exhibits no distension. There is no tenderness.  Lymphadenopathy:    He has no cervical adenopathy.  Neurological: He is alert and oriented to person, place, and time.  Skin: Skin is warm and dry. Capillary refill takes less than 2 seconds. No rash noted.     Left chest HD catheter clean and dry   Psychiatric: He has a normal mood and affect. His behavior is normal.  Vitals reviewed.   Lab Results Lab Results  Component Value Date   WBC 8.2 07/21/2018   HGB 10.9 (L) 07/21/2018   HCT 34.2 (L) 07/21/2018   MCV 92.7 07/21/2018   PLT 167 07/21/2018    Lab Results  Component Value Date   CREATININE 12.78 (H) 07/21/2018   BUN 68 (H) 07/21/2018   NA 136 07/21/2018   K 5.4 (H) 07/21/2018   CL 94 (L) 07/21/2018   CO2 23 07/21/2018    Lab Results  Component Value Date   ALT 16 07/15/2018   AST 23 07/15/2018   ALKPHOS 139 (H) 07/15/2018     BILITOT 0.5 07/15/2018     Microbiology: Recent Results (from the past 240 hour(s))  Culture, blood (routine x 2)     Status: Abnormal   Collection Time: 07/15/18  4:43 PM  Result Value Ref Range Status   Specimen Description   Final    BLOOD BLOOD LEFT FOREARM Performed at [facility], [place] [address], [place], [place] [address]    Special Requests   Final    BOTTLES DRAWN AEROBIC AND ANAEROBIC Blood Culture adequate volume Performed at [facility], [place] [address] [place], [place] [address]    Culture  Setup Time   Final    AEROBIC BOTTLE ONLY GRAM POSITIVE RODS CRITICAL RESULT CALLED TO, READ BACK BY AND VERIFIED WITH: Karsten Ro [facility] 07/19/18 0998 JDW Performed at [facility], [place] [address], [place], [place] [address]    Culture DIPHTHEROIDS(CORYNEBACTERIUM SPECIES) (A)  Final   Report Status 07/21/2018 FINAL  Final  Culture, blood (routine x 2)     Status: Abnormal   Collection Time: 07/15/18  4:47 PM  Result Value Ref Range Status   Specimen Description   Final    BLOOD LEFT ANTECUBITAL Performed at [facility] [address], [place], [place] [address]    Special Requests   Final    BOTTLES DRAWN AEROBIC AND ANAEROBIC Blood Culture adequate volume Performed at [facility] [address] [place], [place] [address]    Culture  Setup Time   Final    GRAM POSITIVE COCCI IN CLUSTERS ANAEROBIC BOTTLE ONLY CRITICAL RESULT CALLED TO, READ BACK BY AND VERIFIED WITH: Jene Every PharmD 15:15 07/16/18 ([email or web address])    Culture (A)  Final    STAPHYLOCOCCUS SPECIES (COAGULASE NEGATIVE) THE SIGNIFICANCE OF ISOLATING THIS ORGANISM FROM A SINGLE SET OF BLOOD CULTURES WHEN MULTIPLE SETS ARE DRAWN IS UNCERTAIN. PLEASE NOTIFY THE MICROBIOLOGY DEPARTMENT WITHIN ONE WEEK IF SPECIATION AND SENSITIVITIES ARE REQUIRED. Performed at [facility], [place] [address], [place], [place] [address]    Report  Status 07/19/2018 FINAL  Final  Blood Culture ID Panel (Reflexed)     Status: Abnormal   Collection Time: 07/15/18  4:47 PM  Result Value Ref Range Status   Enterococcus species NOT DETECTED NOT DETECTED Final   Listeria monocytogenes NOT DETECTED NOT DETECTED Final   Staphylococcus species DETECTED (A) NOT DETECTED Final    Comment: Methicillin (oxacillin) susceptible coagulase negative staphylococcus. Possible blood culture contaminant (unless isolated from more than one blood culture draw or clinical case suggests pathogenicity). No antibiotic treatment is indicated for blood  culture contaminants. CRITICAL RESULT CALLED TO, READ BACK BY AND VERIFIED WITH: Jene Every PharmD 15:15 07/16/18 ([email or web address])    Staphylococcus aureus NOT DETECTED NOT DETECTED Final   Methicillin resistance NOT DETECTED NOT DETECTED Final   Streptococcus species NOT DETECTED NOT DETECTED Final   Streptococcus agalactiae NOT DETECTED NOT DETECTED Final   Streptococcus pneumoniae NOT DETECTED NOT DETECTED Final   Streptococcus pyogenes NOT DETECTED NOT DETECTED Final   Acinetobacter baumannii NOT DETECTED NOT DETECTED Final   Enterobacteriaceae species NOT DETECTED NOT DETECTED Final   Enterobacter cloacae complex NOT DETECTED NOT DETECTED Final   Escherichia coli NOT DETECTED NOT DETECTED Final   Klebsiella oxytoca NOT DETECTED NOT DETECTED Final   Klebsiella pneumoniae NOT DETECTED NOT DETECTED Final   Proteus species NOT DETECTED NOT DETECTED Final   Serratia marcescens NOT DETECTED NOT DETECTED Final   Haemophilus influenzae NOT DETECTED NOT DETECTED Final   Neisseria meningitidis NOT DETECTED NOT DETECTED Final   Pseudomonas aeruginosa NOT DETECTED NOT DETECTED Final   Candida albicans NOT DETECTED NOT DETECTED Final   Candida glabrata NOT DETECTED NOT DETECTED Final   Candida krusei NOT DETECTED NOT DETECTED Final   Candida parapsilosis NOT DETECTED NOT DETECTED Final   Candida tropicalis NOT DETECTED NOT  DETECTED Final    Comment: Performed at [facility], [address] [place], [place] [address]  Aerobic/Anaerobic Culture (surgical/deep wound)  Status: None   Collection Time: 07/15/18  8:53 PM  Result Value Ref Range Status   Specimen Description   Final    WOUND BACK Performed at [facility] [address], [place], [place] [address]    Special Requests   Final    NONE Performed at [facility], [place] [address] [place], [place] [address]    Gram Stain   Final    RARE WBC PRESENT, PREDOMINANTLY PMN RARE GRAM POSITIVE COCCI    Culture   Final    RARE DIPHTHEROIDS(CORYNEBACTERIUM SPECIES) NO ANAEROBES ISOLATED Performed at [facility], [place] [address], [place], [place] [address]    Report Status 07/21/2018 FINAL  Final  MRSA PCR Screening     Status: None   Collection Time: 07/16/18  3:20 AM  Result Value Ref Range Status   MRSA by PCR NEGATIVE NEGATIVE Final    Comment:        The GeneXpert MRSA Assay (FDA approved for NASAL specimens only), is one component of a comprehensive MRSA colonization surveillance program. It is not intended to diagnose MRSA infection nor to guide or monitor treatment for MRSA infections. Performed at [facility], [place] [address], [place], [place] [address]     Janene Madeira, MSN, NP-C [facility] for [facility]ious [place] Cell: [phone] Pager: [phone]  07/21/2018 4:11 PM

## 2018-07-21 NOTE — Telephone Encounter (Signed)
Patient is still currently admitted, I have scheduled him a follow up appointment to see Dr. Havery Walsh in the office on 9/19 at 8:45. I have mailed patient a letter with this information.

## 2018-07-22 ENCOUNTER — Inpatient Hospital Stay (HOSPITAL_COMMUNITY): Payer: Medicare Other

## 2018-07-22 DIAGNOSIS — R7881 Bacteremia: Secondary | ICD-10-CM

## 2018-07-22 DIAGNOSIS — T827XXA Infection and inflammatory reaction due to other cardiac and vascular devices, implants and grafts, initial encounter: Secondary | ICD-10-CM

## 2018-07-22 DIAGNOSIS — I5030 Unspecified diastolic (congestive) heart failure: Secondary | ICD-10-CM

## 2018-07-22 LAB — RENAL FUNCTION PANEL
Albumin: 3.1 g/dL — ABNORMAL LOW (ref 3.5–5.0)
Albumin: 3.3 g/dL — ABNORMAL LOW (ref 3.5–5.0)
Anion gap: 16 — ABNORMAL HIGH (ref 5–15)
Anion gap: 15 (ref 5–15)
BUN: 53 mg/dL — ABNORMAL HIGH (ref 6–20)
BUN: 58 mg/dL — ABNORMAL HIGH (ref 6–20)
CALCIUM: 9 mg/dL (ref 8.9–10.3)
CO2: 25 mmol/L (ref 22–32)
CO2: 26 mmol/L (ref 22–32)
CREATININE: 10.82 mg/dL — AB (ref 0.61–1.24)
Calcium: 9.1 mg/dL (ref 8.9–10.3)
Chloride: 93 mmol/L — ABNORMAL LOW (ref 98–111)
Chloride: 93 mmol/L — ABNORMAL LOW (ref 98–111)
Creatinine, Ser: 11.81 mg/dL — ABNORMAL HIGH (ref 0.61–1.24)
GFR calc Af Amer: 5 mL/min — ABNORMAL LOW (ref >60)
GFR calc non Af Amer: 5 mL/min — ABNORMAL LOW (ref >60)
GFR calc non Af Amer: 4 mL/min — ABNORMAL LOW (ref >60)
GFR, EST AFRICAN AMERICAN: 6 mL/min — AB (ref >60)
GLUCOSE: 273 mg/dL — AB (ref 70–99)
Glucose, Bld: 303 mg/dL — ABNORMAL HIGH (ref 70–99)
Phosphorus: 8.8 mg/dL — ABNORMAL HIGH (ref 2.5–4.6)
Phosphorus: 8.9 mg/dL — ABNORMAL HIGH (ref 2.5–4.6)
Potassium: 5.1 mmol/L (ref 3.5–5.1)
Potassium: 4.9 mmol/L (ref 3.5–5.1)
SODIUM: 134 mmol/L — AB (ref 135–145)
Sodium: 134 mmol/L — ABNORMAL LOW (ref 135–145)

## 2018-07-22 LAB — GLUCOSE, CAPILLARY
GLUCOSE-CAPILLARY: 398 mg/dL — AB (ref 70–99)
GLUCOSE-CAPILLARY: 237 mg/dL — AB (ref 70–99)
Glucose-Capillary: 228 mg/dL — ABNORMAL HIGH (ref 70–99)

## 2018-07-22 LAB — CBC
HCT: 33.3 % — ABNORMAL LOW (ref 39.0–52.0)
Hemoglobin: 10.6 g/dL — ABNORMAL LOW (ref 13.0–17.0)
MCH: 29.5 pg (ref 26.0–34.0)
MCHC: 31.8 g/dL (ref 30.0–36.0)
MCV: 92.8 fL (ref 78.0–100.0)
PLATELETS: 160 10*3/uL (ref 150–400)
RBC: 3.59 MIL/uL — AB (ref 4.22–5.81)
RDW: 14.2 % (ref 11.5–15.5)
WBC: 7.6 10*3/uL (ref 4.0–10.5)

## 2018-07-22 LAB — ECHOCARDIOGRAM COMPLETE
Height: 78 in
WEIGHTICAEL: 4507.97 [oz_av]

## 2018-07-22 MED ORDER — ALTEPLASE 2 MG IJ SOLR: 2.0000 mg | Freq: Once | INTRAMUSCULAR | Status: DC | PRN

## 2018-07-22 MED ORDER — PENTAFLUOROPROP-TETRAFLUOROETH EX AERO: 1.0000 "application " | INHALATION_SPRAY | CUTANEOUS | Status: DC | PRN

## 2018-07-22 MED ORDER — HEPARIN SODIUM (PORCINE) 1000 UNIT/ML DIALYSIS
1000.0000 [IU] | INTRAMUSCULAR | Status: DC | PRN
Start: 2018-07-22 — End: 2018-07-22

## 2018-07-22 MED ORDER — LIDOCAINE-PRILOCAINE 2.5-2.5 % EX CREA: 1.0000 "application " | TOPICAL_CREAM | CUTANEOUS | Status: DC | PRN

## 2018-07-22 MED ORDER — SODIUM CHLORIDE 0.9 % IV SOLN: 100.0000 mL | INTRAVENOUS | Status: DC | PRN

## 2018-07-22 MED ORDER — LIDOCAINE HCL (PF) 1 % IJ SOLN: 5.0000 mL | INTRAMUSCULAR | Status: DC | PRN

## 2018-07-22 MED ORDER — HEPARIN SODIUM (PORCINE) 1000 UNIT/ML DIALYSIS: 1000.0000 [IU] | INTRAMUSCULAR | Status: DC | PRN

## 2018-07-22 MED ORDER — INSULIN ASPART 100 UNIT/ML ~~LOC~~ SOLN
2.0000 [IU] | Freq: Three times a day (TID) | SUBCUTANEOUS | Status: DC
  Administered 2018-07-23 (×2): 2 [IU] via SUBCUTANEOUS

## 2018-07-22 MED ORDER — HEPARIN SODIUM (PORCINE) 1000 UNIT/ML DIALYSIS: 20.0000 [IU]/kg | INTRAMUSCULAR | Status: DC | PRN

## 2018-07-22 MED ORDER — PERFLUTREN LIPID MICROSPHERE
1.0000 mL | INTRAVENOUS | Status: AC | PRN
  Administered 2018-07-22: 2 mL via INTRAVENOUS
  Filled 2018-07-22: qty 10

## 2018-07-22 MED ORDER — HEPARIN SODIUM (PORCINE) 1000 UNIT/ML DIALYSIS: 5000.0000 [IU] | Freq: Once | INTRAMUSCULAR | Status: DC

## 2018-07-22 MED ORDER — INSULIN GLARGINE 100 UNIT/ML ~~LOC~~ SOLN
15.0000 [IU] | Freq: Every day | SUBCUTANEOUS | Status: DC
  Administered 2018-07-22: 15 [IU] via SUBCUTANEOUS
  Filled 2018-07-22 (×2): qty 0.15

## 2018-07-22 MED ORDER — FENTANYL CITRATE (PF) 100 MCG/2ML IJ SOLN
INTRAMUSCULAR | Status: AC
  Administered 2018-07-22: 50 ug via INTRAVENOUS
  Filled 2018-07-22: qty 2

## 2018-07-22 MED ORDER — CEFAZOLIN SODIUM-DEXTROSE 2-4 GM/100ML-% IV SOLN
2.0000 g | INTRAVENOUS | Status: DC
  Filled 2018-07-22: qty 100

## 2018-07-22 NOTE — Progress Notes (Signed)
  Echocardiogram 2D Echocardiogram has been performed.  David Walsh 07/22/2018, 10:03 AM

## 2018-07-22 NOTE — Progress Notes (Signed)
PROGRESS NOTE  David Walsh Post  OVF:[id number] DOB: 1966/10/21 DOA: 07/15/2018 [facility]   Brief Narrative: David Walsh is a 52 y.o. male with a history of ESRD (TTS), right BKA, T2DM, HTN, and chronic abdominal pain who presented to the ED with an upper back abscess not responsive to outpatient antibiotics. I&D was performed in the ED and he was admitted on IV antibiotics. Blood cultures had been drawn and grew CoNS in 1 of 4 bottles and diphtheroids in a different of 4 bottles. Due to concern for line involvement, his TDC was removed 8/2 and dialysis catheter replaced on 8/3. He has continued on ancef. ID was consulted, recommended TTE to inform duration of antibiotics prior to discharge.  Assessment & Plan: Principal Problem:   Abscess Active Problems:   DM (diabetes mellitus) (HCC)   HTN (hypertension)   ESRD (end stage renal disease) (HCC)   Abdominal pain   Hx of BKA, right (HCC)  Abscess and cellulitis: On upper back. No discharge or tenderness. - Was on vancomycin, now ancef. Will continue  for possible bacteremia as below. Definitive Tx of abscess was I&D on admission.  CoNS bacteremia:  - Repeated blood cultures 8/5, NGTD - Echocardiogram ordered to evaluate for endocarditis. This showed a calcified MV annulus with calcified mass (1.2x1cm) on posterior leaflet which may represent old calcified vegetation (fairly non-mobile). - Continue ancef. Dosing in ESRD would be amenable to outpatient Tx at HD.  Diphtheroids in 1 of 4 blood cultures: - Per ID.   ESRD:  - Continue routine HD through new TDC inserted 8/3 per nephrology  T2DM:  - Continue basal-bolus insulin AC/HS > augmenting lantus daily due to uncontrolled hyperglycemia.  - Agree w/starting mealtime insulin - Will change to carb-modified diet.  Chronic abdominal pain: GI consulted, felt neuropathy was a possible explanation. No acute findings on CT abd/pelvis, has reportedly normal colo last year with the [place].  -  Continue gabapentin, lidoderm patch, bowel regimen - Has follow up with Dr. Havery Moros 9/19.  HTN:  - Continue home medications   Hypothyroidism:  - Continue stable dose synthroid  New L2 vertebral body subcentimeter lucent foci: Seen on CT abd/pelvis 07/15/2018. DDx includes brown tumors w/renal osteodystrophy, lytic mets or myeloma, though no other constellation of findings to feel this is likely.  - Recommend repeat imaging as outpatient to ensure stability.  DVT prophylaxis: Heparin Code Status: Full Family Communication: None at bedside Disposition Plan: Home pending repeat blood cultures negative x48 hours and pending ID input regarding duration of abx.  Consultants:   Nephrology  IR  Infectious Diseases  Procedures:   8/3 Left IJ double lumen tunneled hemodialysis catheter placement.  Antimicrobials:  Vancomycin given 7/30, 8/1  Levaquin 7/30  Ceftriaxone 7/30  Aztreonam 7/30  Ancef 8/2 >>   Subjective: No changes. Tolerating HD. Abdominal pain is severe, unchanged, waxing/waning worst along right abdomen. Again, has been there for months.  Objective: Vitals:   07/22/18 1700 07/22/18 1715 07/22/18 1730 07/22/18 1745  BP: (!) 104/51 (!) 102/55 103/61 (!) 113/56  Pulse: 72 69 72 72  Resp: 15 15 16 16   Temp:      TempSrc:      SpO2:      Weight:      Height:        Intake/Output Summary (Last 24 hours) at 07/22/2018 1755 Last data filed at 07/22/2018 1500 Gross per 24 hour  Intake 780 ml  Output 0 ml  Net 780 ml  Filed Weights   07/21/18 1055 07/21/18 2046 07/22/18 1445  Weight: 127.8 kg (281 lb 12 oz) 127.8 kg (281 lb 12 oz) 134.9 kg (297 lb 6.4 oz)   Gen: 52 y.o. male in no distress Pulm: Nonlabored breathing room air. Clear. CV: Regular rate and rhythm. No murmur, rub, or gallop. No JVD, 1+ dependent edema. GI: Abdomen soft, non-tender, non-distended, with normoactive bowel sounds.  Ext: Warm, right BKA Skin: Upper back with small induration  right of midline with c/d/i dressing, nontender, no spreading erythema or discharge. Right IJ TDC site appropriately tender, no discharge. Left IJ TDC nontender, no erythema or discharge. Neuro: Alert and oriented. No focal neurological deficits. Psych: Judgement and insight appear fair. Mood euthymic & affect congruent. Behavior is appropriate.    Data Reviewed: I have personally reviewed following labs and imaging studies  CBC: Recent Labs  Lab 07/17/18 0625 07/17/18 0828 07/18/18 0625 07/21/18 0815 07/22/18 1507  WBC 9.3 6.1 6.9 8.2 7.6  HGB 6.7* 11.3* 11.2* 10.9* 10.6*  HCT 21.3* 34.9* 35.0* 34.2* 33.3*  MCV 92.6 90.9 91.9 92.7 92.8  PLT 281 219 192 167 563   Basic Metabolic Panel: Recent Labs  Lab 07/18/18 0625 07/19/18 0415 07/21/18 0815 07/22/18 0401 07/22/18 1508  NA 135 135 136 134* 134*  K 5.3* 5.3* 5.4* 5.1 4.9  CL 94* 94* 94* 93* 93*  CO2 25 26 23 25 26   GLUCOSE 326* 309* 250* 273* 303*  BUN 39* 52* 68* 53* 58*  CREATININE 8.97* 10.81* 12.78* 10.82* 11.81*  CALCIUM 9.3 9.4 9.4 9.0 9.1  PHOS 7.8* 9.3* 9.7* 8.8* 8.9*   GFR: Estimated Creatinine Clearance: 11.3 mL/min (A) (by C-G formula based on SCr of 11.81 mg/dL (H)). Liver Function Tests: Recent Labs  Lab 07/18/18 0625 07/19/18 0415 07/21/18 0815 07/22/18 0401 07/22/18 1508  ALBUMIN 3.3* 3.3* 3.4* 3.1* 3.3*   No results for input(s): LIPASE, AMYLASE in the last 168 hours. No results for input(s): AMMONIA in the last 168 hours. Coagulation Profile: Recent Labs  Lab 07/18/18 1505  INR 1.12   Cardiac Enzymes: No results for input(s): CKTOTAL, CKMB, CKMBINDEX, TROPONINI in the last 168 hours. BNP (last 3 results) No results for input(s): PROBNP in the last 8760 hours. HbA1C: No results for input(s): HGBA1C in the last 72 hours. CBG: Recent Labs  Lab 07/21/18 1151 07/21/18 1703 07/21/18 2044 07/22/18 0741 07/22/18 1151  GLUCAP 276* 312* 307* 228* 398*   Lipid Profile: No results for  input(s): CHOL, HDL, LDLCALC, TRIG, CHOLHDL, LDLDIRECT in the last 72 hours. Thyroid Function Tests: No results for input(s): TSH, T4TOTAL, FREET4, T3FREE, THYROIDAB in the last 72 hours. Anemia Panel: No results for input(s): VITAMINB12, FOLATE, FERRITIN, TIBC, IRON, RETICCTPCT in the last 72 hours. Urine analysis:    Component Value Date/Time   COLORURINE YELLOW 12/15/2010 2309   APPEARANCEUR CLEAR 12/15/2010 2309   LABSPEC 1.021 12/15/2010 2309   PHURINE 6.5 12/15/2010 2309   GLUCOSEU 500 (A) 12/15/2010 2309   HGBUR LARGE (A) 12/15/2010 2309   BILIRUBINUR NEGATIVE 12/15/2010 2309   KETONESUR NEGATIVE 12/15/2010 2309   PROTEINUR >300 (A) 12/15/2010 2309   UROBILINOGEN 0.2 12/15/2010 2309   NITRITE NEGATIVE 12/15/2010 2309   LEUKOCYTESUR NEGATIVE 12/15/2010 2309   Recent Results (from the past 240 hour(s))  Culture, blood (routine x 2)     Status: Abnormal   Collection Time: 07/15/18  4:43 PM  Result Value Ref Range Status   Specimen Description   Final  BLOOD BLOOD LEFT FOREARM Performed at [facility] [address], [place], [place] [address]    Special Requests   Final    BOTTLES DRAWN AEROBIC AND ANAEROBIC Blood Culture adequate volume Performed at [facility] [address], [place], [place] [address]    Culture  Setup Time   Final    AEROBIC BOTTLE ONLY GRAM POSITIVE RODS CRITICAL RESULT CALLED TO, READ BACK BY AND VERIFIED WITH: Karsten Ro [facility] 07/19/18 6599 JDW Performed at [facility], [place] [address], [place], [place] [address]    Culture DIPHTHEROIDS(CORYNEBACTERIUM SPECIES) (A)  Final   Report Status 07/21/2018 FINAL  Final  Culture, blood (routine x 2)     Status: Abnormal   Collection Time: 07/15/18  4:47 PM  Result Value Ref Range Status   Specimen Description   Final    BLOOD LEFT ANTECUBITAL Performed at [facility] [address] [place], [place] [address]    Special Requests    Final    BOTTLES DRAWN AEROBIC AND ANAEROBIC Blood Culture adequate volume Performed at [facility] [address], [place], [place] [address]    Culture  Setup Time   Final    GRAM POSITIVE COCCI IN CLUSTERS ANAEROBIC BOTTLE ONLY CRITICAL RESULT CALLED TO, READ BACK BY AND VERIFIED WITH: Jene Every PharmD 15:15 07/16/18 ([email or web address])    Culture (A)  Final    STAPHYLOCOCCUS SPECIES (COAGULASE NEGATIVE) THE SIGNIFICANCE OF ISOLATING THIS ORGANISM FROM A SINGLE SET OF BLOOD CULTURES WHEN MULTIPLE SETS ARE DRAWN IS UNCERTAIN. PLEASE NOTIFY THE MICROBIOLOGY DEPARTMENT WITHIN ONE WEEK IF SPECIATION AND SENSITIVITIES ARE REQUIRED. Performed at [facility], [place] [address] [place],  [address]    Report Status 07/19/2018 FINAL  Final  Blood Culture ID Panel (Reflexed)     Status: Abnormal   Collection Time: 07/15/18  4:47 PM  Result Value Ref Range Status   Enterococcus species NOT DETECTED NOT DETECTED Final   Listeria monocytogenes NOT DETECTED NOT DETECTED Final   Staphylococcus species DETECTED (A) NOT DETECTED Final    Comment: Methicillin (oxacillin) susceptible coagulase negative staphylococcus. Possible blood culture contaminant (unless isolated from more than one blood culture draw or clinical case suggests pathogenicity). No antibiotic treatment is indicated for blood  culture contaminants. CRITICAL RESULT CALLED TO, READ BACK BY AND VERIFIED WITH: Jene Every PharmD 15:15 07/16/18 ([email or web address])    Staphylococcus aureus NOT DETECTED NOT DETECTED Final   Methicillin resistance NOT DETECTED NOT DETECTED Final   Streptococcus species NOT DETECTED NOT DETECTED Final   Streptococcus agalactiae NOT DETECTED NOT DETECTED Final   Streptococcus pneumoniae NOT DETECTED NOT DETECTED Final   Streptococcus pyogenes NOT DETECTED NOT DETECTED Final   Acinetobacter baumannii NOT DETECTED NOT DETECTED Final   Enterobacteriaceae species NOT DETECTED NOT DETECTED Final    Enterobacter cloacae complex NOT DETECTED NOT DETECTED Final   Escherichia coli NOT DETECTED NOT DETECTED Final   Klebsiella oxytoca NOT DETECTED NOT DETECTED Final   Klebsiella pneumoniae NOT DETECTED NOT DETECTED Final   Proteus species NOT DETECTED NOT DETECTED Final   Serratia marcescens NOT DETECTED NOT DETECTED Final   Haemophilus influenzae NOT DETECTED NOT DETECTED Final   Neisseria meningitidis NOT DETECTED NOT DETECTED Final   Pseudomonas aeruginosa NOT DETECTED NOT DETECTED Final   Candida albicans NOT DETECTED NOT DETECTED Final   Candida glabrata NOT DETECTED NOT DETECTED Final   Candida krusei NOT DETECTED NOT DETECTED Final  Candida parapsilosis NOT DETECTED NOT DETECTED Final   Candida tropicalis NOT DETECTED NOT DETECTED Final    Comment: Performed at [facility], [place] [address] [place], [place] [address]  Aerobic/Anaerobic Culture (surgical/deep wound)     Status: None   Collection Time: 07/15/18  8:53 PM  Result Value Ref Range Status   Specimen Description   Final    WOUND BACK Performed at [facility] [address] [place], [place] [address]    Special Requests   Final    NONE Performed at [facility], [place] [address], [place], [place] [address]    Gram Stain   Final    RARE WBC PRESENT, PREDOMINANTLY PMN RARE GRAM POSITIVE COCCI    Culture   Final    RARE DIPHTHEROIDS(CORYNEBACTERIUM SPECIES) NO ANAEROBES ISOLATED Performed at [facility], [place] [address], [place], [place] [address]    Report Status 07/21/2018 FINAL  Final  MRSA PCR Screening     Status: None   Collection Time: 07/16/18  3:20 AM  Result Value Ref Range Status   MRSA by PCR NEGATIVE NEGATIVE Final    Comment:        The GeneXpert MRSA Assay (FDA approved for NASAL specimens only), is one component of a comprehensive MRSA colonization surveillance program. It is not intended to diagnose MRSA infection nor to guide or monitor  treatment for MRSA infections. Performed at [facility], [place] [address] [place], [place] [address]   Culture, blood (routine x 2)     Status: None (Preliminary result)   Collection Time: 07/21/18 12:45 PM  Result Value Ref Range Status   Specimen Description BLOOD LEFT ARM  Final   Special Requests   Final    BOTTLES DRAWN AEROBIC AND ANAEROBIC Blood Culture adequate volume   Culture   Final    NO GROWTH < 24 HOURS Performed at [facility], [place] [address] [place], [place] [address]    Report Status PENDING  Incomplete  Culture, blood (routine x 2)     Status: None (Preliminary result)   Collection Time: 07/21/18 12:54 PM  Result Value Ref Range Status   Specimen Description BLOOD RIGHT ANTECUBITAL  Final   Special Requests   Final    BOTTLES DRAWN AEROBIC AND ANAEROBIC Blood Culture adequate volume   Culture   Final    NO GROWTH < 24 HOURS Performed at [facility], [place] [address], [place], [place] [address]    Report Status PENDING  Incomplete      Radiology Studies: No results found.  Scheduled Meds: . amLODipine  5 mg Oral Daily  . Chlorhexidine Gluconate Cloth  6 each Topical Q0600  . feeding supplement (NEPRO CARB STEADY)  237 mL Oral BID BM  . gabapentin  300 mg Oral QHS  . heparin  5,000 Units Subcutaneous Q8H  . hydrALAZINE  50 mg Oral Q8H  . insulin aspart  0-15 Units Subcutaneous TID WC  . insulin glargine  15 Units Subcutaneous QHS  . levothyroxine  25 mcg Oral QAC breakfast  . lidocaine  2 patch Transdermal Q24H  . metoprolol tartrate  100 mg Oral BID  . multivitamin  1 tablet Oral QHS  . ondansetron (ZOFRAN) IV  4 mg Intravenous Once  . pantoprazole  40 mg Oral Q0600  . polyethylene glycol  17 g Oral Daily  . senna  2 tablet Oral QHS  . sevelamer carbonate  2,400 mg Oral TID WC   Continuous Infusions: .  ceFAZolin (ANCEF) IV       LOS: 7 days   Time spent: 25 minutes.  Patrecia Pour, MD [facility] [email or web address] Password [email or web address] 07/22/2018, 5:55 PM

## 2018-07-22 NOTE — Progress Notes (Signed)
Notified MD Grunz of CBG 398 and that pt was not on a carb modified diet.

## 2018-07-22 NOTE — Progress Notes (Signed)
Inpatient Diabetes Program Recommendations  AACE/ADA: New Consensus Statement on Inpatient Glycemic Control (2015)  Target Ranges:  Prepandial:   less than 140 mg/dL      Peak postprandial:   less than 180 mg/dL (1-2 hours)      Critically ill patients:  140 - 180 mg/dL   Lab Results  Component Value Date   GLUCAP 228 (H) 07/22/2018   HGBA1C (H) 12/16/2010    7.7 (NOTE)                                                                       According to the ADA Clinical Practice Recommendations for 2011, when HbA1c is used as a screening test:   >=6.5%   Diagnostic of Diabetes Mellitus           (if abnormal result  is confirmed)  5.7-6.4%   Increased risk of developing Diabetes Mellitus  References:Diagnosis and Classification of Diabetes Mellitus,Diabetes IHWT,8882,80(KLKJZ 1):S62-S69 and Standards of Medical Care in         Diabetes - 2011,Diabetes Care,2011,34  (Suppl 1):S11-S61.    Review of Glycemic Control Results for David Walsh, David Walsh (MRN [id number]) as of 07/22/2018 11:05  Ref. Range 07/21/2018 11:51 07/21/2018 17:03 07/21/2018 20:44 07/22/2018 07:41  Glucose-Capillary Latest Ref Range: 70 - 99 mg/dL 276 (H) 312 (H) 307 (H) 228 (H)   Diabetes history: DM 2 Outpatient Diabetes medications: Lantus 5 units Daily, Novolog 0-6 units tid Current orders for Inpatient glycemic control: Lantus 15 units Daily, Novolog 0-15 units tid  Inpatient Diabetes Program Recommendations:   Noted increase in Lantus. -Add Novolog 2-3 units tid meal coverage if eats 50%  Thank you, Bethena Roys E. Barak Bialecki, RN, MSN, CDE  Diabetes Coordinator Inpatient Glycemic Control Team Team Pager [phone] (8am-5pm) 07/22/2018 11:08 AM

## 2018-07-22 NOTE — Progress Notes (Addendum)
[facility] Progress Note   Subjective: Says he had abdominal pain last night-didn't sleep well.   Objective Vitals:   07/21/18 1623 07/21/18 2046 07/22/18 0510 07/22/18 1043  BP: 138/70 114/66 115/71 (!) 171/97  Pulse: 88 84 72 79  Resp: 18 18 18 18   Temp: 98.4 F (36.9 C) 98.9 F (37.2 C) 98.6 F (37 C) 97.7 F (36.5 C)  TempSrc: Oral Oral Oral Oral  SpO2: 96% 96% 94% 96%  Weight:  127.8 kg (281 lb 12 oz)    Height:       Physical Exam General: Obese male in NAD Heart: RRR Lungs: CTAB Abdomen: Obese, active BS Extremities: R BKA with prothesis, LLE with compression hose, trace edema Dialysis Access: [facility].     Additional Objective Labs: Basic Metabolic Panel: Recent Labs  Lab 07/19/18 0415 07/21/18 0815 07/22/18 0401  NA 135 136 134*  K 5.3* 5.4* 5.1  CL 94* 94* 93*  CO2 26 23 25   GLUCOSE 309* 250* 273*  BUN 52* 68* 53*  CREATININE 10.81* 12.78* 10.82*  CALCIUM 9.4 9.4 9.0  PHOS 9.3* 9.7* 8.8*   Liver Function Tests: Recent Labs  Lab 07/15/18 1643  07/19/18 0415 07/21/18 0815 07/22/18 0401  AST 23  --   --   --   --   ALT 16  --   --   --   --   ALKPHOS 139*  --   --   --   --   BILITOT 0.5  --   --   --   --   PROT 7.7  --   --   --   --   ALBUMIN 3.5   < > 3.3* 3.4* 3.1*   < > = values in this interval not displayed.   Recent Labs  Lab 07/15/18 1643  LIPASE 54*   CBC: Recent Labs  Lab 07/15/18 1643 07/17/18 0625 07/17/18 0828 07/18/18 0625 07/21/18 0815  WBC 6.9 9.3 6.1 6.9 8.2  NEUTROABS 4.8  --   --   --   --   HGB 12.3* 6.7* 11.3* 11.2* 10.9*  HCT 36.1* 21.3* 34.9* 35.0* 34.2*  MCV 87.4 92.6 90.9 91.9 92.7  PLT 262 281 219 192 167   Blood Culture    Component Value Date/Time   SDES  07/15/2018 2053    WOUND BACK Performed at [facility], [place] [address] [place], [place] [address]    SPECREQUEST  07/15/2018 2053    NONE Performed at [facility], [place]  [address], [place], [place] [address]    CULT  07/15/2018 2053    RARE DIPHTHEROIDS(CORYNEBACTERIUM SPECIES) NO ANAEROBES ISOLATED Performed at [facility] [address] [place],  [address]    REPTSTATUS 07/21/2018 FINAL 07/15/2018 2053    Cardiac Enzymes: No results for input(s): CKTOTAL, CKMB, CKMBINDEX, TROPONINI in the last 168 hours. CBG: Recent Labs  Lab 07/21/18 1151 07/21/18 1703 07/21/18 2044 07/22/18 0741 07/22/18 1151  GLUCAP 276* 312* 307* 228* 398*   Iron Studies: No results for input(s): IRON, TIBC, TRANSFERRIN, FERRITIN in the last 72 hours. @lablastinr3 @ Studies/Results: No results found. Medications: .  ceFAZolin (ANCEF) IV     . amLODipine  5 mg Oral Daily  . Chlorhexidine Gluconate Cloth  6 each Topical Q0600  . feeding supplement (NEPRO CARB STEADY)  237 mL Oral BID BM  . gabapentin  300 mg Oral QHS  . heparin  5,000 Units  Subcutaneous Q8H  . hydrALAZINE  50 mg Oral Q8H  . insulin aspart  0-15 Units Subcutaneous TID WC  . insulin glargine  15 Units Subcutaneous QHS  . levothyroxine  25 mcg Oral QAC breakfast  . lidocaine  2 patch Transdermal Q24H  . metoprolol tartrate  100 mg Oral BID  . multivitamin  1 tablet Oral QHS  . ondansetron (ZOFRAN) IV  4 mg Intravenous Once  . pantoprazole  40 mg Oral Q0600  . polyethylene glycol  17 g Oral Daily  . senna  2 tablet Oral QHS  . sevelamer carbonate  2,400 mg Oral TID WC     Dialysis Orders: TTS [facility] [phone] 4.5h   279lb/ 127.5kg  Prosthetic weighs 33Lb/ 15kg   TDC (removed/ replaced here)   Assessment/Plan: 1. Bacteremia + MSSE-1/2 BC, IRremovedcatheter 8/2 and replaced 07/19/18. Continue Ancef. Will require total of 3  weeks of Ancef.  Echo done this AM. Repeat BC X 2 pending. Followed by ID.  2. ESRD-TTS.  HD off schedule yesterday, HD today to get back on schedule. K+ 5.1 3. Anemia- hgb 10.9- no meds  4. Secondary hyperparathyroidism- on renvela. Phos 8.8 Ca  9.0 C Ca 9.7.  5.HTN/volume- got close to OP EDW yesterday. 1.5-2.5 liters today.  6. Nutrition- Albumin 3.3 change to renal carb mod diet + vits add nepro.  7. Abscess Backs/p I and D - on Ancef gram + cocci 8. 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 Started on gabapentin and lidoderm patch. Chronic issue.   9. DM - per primary 10  New L2 foci- need re image ?  Per primary      Jimmye Norman. Brown NP-C 07/22/2018, 12:48 PM  [facility] [phone]  Pt seen, examined and agree w A/P as above.  Kelly Splinter MD [facility] [employer] pager [phone]   07/22/2018, 2:28 PM

## 2018-07-23 LAB — GLUCOSE, CAPILLARY
GLUCOSE-CAPILLARY: 281 mg/dL — AB (ref 70–99)
Glucose-Capillary: 233 mg/dL — ABNORMAL HIGH (ref 70–99)

## 2018-07-23 MED ORDER — HYDROCODONE-ACETAMINOPHEN 5-325 MG PO TABS: 1.0000 | ORAL_TABLET | Freq: Four times a day (QID) | ORAL | 0 refills | Status: AC | PRN

## 2018-07-23 MED ORDER — SENNA 8.6 MG PO TABS: 2.0000 | ORAL_TABLET | Freq: Every day | ORAL | 0 refills | Status: DC

## 2018-07-23 MED ORDER — PANTOPRAZOLE SODIUM 40 MG PO TBEC: 40.0000 mg | DELAYED_RELEASE_TABLET | Freq: Every day | ORAL | 0 refills | Status: AC

## 2018-07-23 MED ORDER — GABAPENTIN 600 MG PO TABS: 300.0000 mg | ORAL_TABLET | Freq: Every day | ORAL | 0 refills | Status: AC

## 2018-07-23 MED ORDER — POLYETHYLENE GLYCOL 3350 17 G PO PACK: 17.0000 g | PACK | Freq: Every day | ORAL | 0 refills | Status: DC

## 2018-07-23 MED ORDER — CHLORHEXIDINE GLUCONATE CLOTH 2 % EX PADS
6.0000 | MEDICATED_PAD | Freq: Every day | CUTANEOUS | Status: DC
  Administered 2018-07-23: 6 via TOPICAL

## 2018-07-23 MED ORDER — LIDOCAINE 5 % EX PTCH: 2.0000 | MEDICATED_PATCH | CUTANEOUS | 0 refills | Status: AC

## 2018-07-23 NOTE — Progress Notes (Addendum)
[facility] Progress Note   Subjective: Hopefully DC home today if BC negative for 48 hours.   Objective Vitals:   07/22/18 2207 07/22/18 2322 07/23/18 0514 07/23/18 0751  BP: 132/78 103/62 125/77 (!) 144/87  Pulse: 81 78 73 67  Resp: 18  18 18   Temp:   98.1 F (36.7 C) 97.9 F (36.6 C)  TempSrc:   Oral Oral  SpO2: 90% 97% 94% 97%  Weight:      Height:       Physical Exam General: Obese male in NAD Heart: S1,S2, RRR Lungs: CTAB A/P Abdomen: Active BS Extremities: R BKA with prothesis, LLE without edema today.  Dialysis Access: [facility].   Additional Objective Labs: Basic Metabolic Panel: Recent Labs  Lab 07/21/18 0815 07/22/18 0401 07/22/18 1508  NA 136 134* 134*  K 5.4* 5.1 4.9  CL 94* 93* 93*  CO2 23 25 26   GLUCOSE 250* 273* 303*  BUN 68* 53* 58*  CREATININE 12.78* 10.82* 11.81*  CALCIUM 9.4 9.0 9.1  PHOS 9.7* 8.8* 8.9*   Liver Function Tests: Recent Labs  Lab 07/21/18 0815 07/22/18 0401 07/22/18 1508  ALBUMIN 3.4* 3.1* 3.3*   No results for input(s): LIPASE, AMYLASE in the last 168 hours. CBC: Recent Labs  Lab 07/17/18 0625 07/17/18 0828 07/18/18 0625 07/21/18 0815 07/22/18 1507  WBC 9.3 6.1 6.9 8.2 7.6  HGB 6.7* 11.3* 11.2* 10.9* 10.6*  HCT 21.3* 34.9* 35.0* 34.2* 33.3*  MCV 92.6 90.9 91.9 92.7 92.8  PLT 281 219 192 167 160   Blood Culture    Component Value Date/Time   SDES BLOOD RIGHT ANTECUBITAL 07/21/2018 1254   SPECREQUEST  07/21/2018 1254    BOTTLES DRAWN AEROBIC AND ANAEROBIC Blood Culture adequate volume   CULT  07/21/2018 1254    NO GROWTH < 24 HOURS Performed at [facility] [address], [place],  [address]    REPTSTATUS PENDING 07/21/2018 1254    Cardiac Enzymes: No results for input(s): CKTOTAL, CKMB, CKMBINDEX, TROPONINI in the last 168 hours. CBG: Recent Labs  Lab 07/21/18 2044 07/22/18 0741 07/22/18 1151 07/22/18 2039 07/23/18 0749  GLUCAP 307* 228* 398* 237* 233*    Iron Studies: No results for input(s): IRON, TIBC, TRANSFERRIN, FERRITIN in the last 72 hours. @lablastinr3 @ Studies/Results: No results found. Medications: .  ceFAZolin (ANCEF) IV     . amLODipine  5 mg Oral Daily  . Chlorhexidine Gluconate Cloth  6 each Topical Q0600  . feeding supplement (NEPRO CARB STEADY)  237 mL Oral BID BM  . gabapentin  300 mg Oral QHS  . heparin  5,000 Units Subcutaneous Q8H  . hydrALAZINE  50 mg Oral Q8H  . insulin aspart  0-15 Units Subcutaneous TID WC  . insulin aspart  2 Units Subcutaneous TID WC  . insulin glargine  15 Units Subcutaneous QHS  . levothyroxine  25 mcg Oral QAC breakfast  . lidocaine  2 patch Transdermal Q24H  . metoprolol tartrate  100 mg Oral BID  . multivitamin  1 tablet Oral QHS  . ondansetron (ZOFRAN) IV  4 mg Intravenous Once  . pantoprazole  40 mg Oral Q0600  . polyethylene glycol  17 g Oral Daily  . senna  2 tablet Oral QHS  . sevelamer carbonate  2,400 mg Oral TID WC     Dialysis Orders:TTS [facility] [phone] 4.5h 279lb/ 127.5kg Prosthetic weighs 33Lb/ 15kg TDC (removed/ replaced here)   Assessment/Plan: 1. Bacteremia + MSSE-1/2 BC,IRremovedcatheter  8/2andreplaced 07/19/18. Continue Ancef. Will require total of 3 weeks of Ancef. Echo done this AM. Repeat BC X 2 pending. Followed by ID.  2. ESRD-TTS. HD off schedule 07/21/18 D/T shortened tx, increased volume, HD 07/22/18 to get back on schedule. Next HD 08/08.  3.Anemia- hgb 10.9- no meds  4. Secondary hyperparathyroidism- on renvela. Phos 8.8 Ca 9.0 C Ca 9.7.  5.HTN/volume-got close to OP EDW yesterday. 1.5-2.5 liters today.  6. Nutrition- Albumin 3.3 change to renal carb mod diet + vits add nepro.  7. Abscess Backs/p I and D - on Ancef gram + cocci 8. 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 Started on gabapentin and lidoderm patch. Chronic issue.   9. DM - per primary 10 New L2 foci- need re image ? Per  primary     Jimmye Norman. Brown NP-C 07/23/2018, 11:01 AM  [facility] [phone]  Pt seen, examined and agree w A/P as above.  Kelly Splinter MD [employer] pager [phone]   07/23/2018, 11:37 AM

## 2018-07-23 NOTE — Progress Notes (Signed)
Patient discharged to home. Patient AVS reviewed and signed. Patient capable re-verbalizing medications and follow-up appointments. IV removed. Patient belongings sent with patient. Patient educated to return to the ED in the event of SOB, chest pain or dizziness.   Juda Lajeunesse B. RN 

## 2018-07-23 NOTE — Discharge Summary (Signed)
Physician Discharge Summary  [name] QIW:[id number] DOB: [birth year] DOA: 07/15/2018  PCP: [facility]  Admit date: 07/15/2018 Discharge date: 07/23/2018  Admitted From: Home Disposition: Home   Recommendations for Outpatient Follow-up:  1. Follow up with PCP in 1-2 weeks 2. Please obtain BMP/CBC in one week 3. Please follow up on the following pending results: Final repeat blood culture data (NG2D at discharge). 4. Recommend repeat imaging of L2 vertebral body subcentimeter lucent foci seen on CT.  5. Follow up with GI as scheduled for chronic neuropathic abdominal pain  Home Health: None Equipment/Devices: None Discharge Condition: Stable CODE STATUS: Full Diet recommendation: Renal, carb-modified  Brief/Interim Summary: David Walsh is a 52 y.o. male with a history of ESRD (TTS), right BKA, T2DM, HTN, and chronic abdominal pain who presented to the ED with an upper back abscess not responsive to outpatient antibiotics. I&D was performed in the ED and he was admitted on IV antibiotics. Blood cultures had been drawn and grew CoNS in 1 of 4 bottles and diphtheroids in a different of 4 bottles. Due to concern for line involvement, his TDC was removed 8/2 and dialysis catheter replaced on 8/3. He has continued on ancef. ID was consulted, recommended TTE to inform duration of antibiotics prior to discharge. Echo showed calcified MV annulus with posterior leaflet calcification (1.2 x 1cm) which was nonmobile, not felt to represent new vegetation. After discussion with ID, the patient will require IV ancef for 10 days following clearance of bacteremia for line-related CoNS bacteremia. His final dose will be 8/13 with HD.  Discharge Diagnoses:  Principal Problem:   Abscess Active Problems:   DM (diabetes mellitus) ([place])   HTN (hypertension)   ESRD (end stage renal disease) ([place])   Abdominal pain   Hx of BKA, right ([place])   Bacteremia associated with intravascular line  ([place])  Abscess and cellulitis: On upper back. No discharge or tenderness. - Was on vancomycin, now ancef. Will continue  for possible bacteremia as below. Definitive Tx of abscess was I&D on admission.  CoNS bacteremia:  - Removed HD catheter. - Repeated blood cultures 8/5, NGTD at 48 hours. Will continue 10 days of Tx from documented clearance (which was 8/5 and would make final dose 8/13). Ancef 28 every TTS.  - Echocardiogram ordered to evaluate for endocarditis. This showed a calcified MV annulus with calcified mass (1.2x1cm) onposterior leaflet which may represent old calcified vegetation(fairly non-mobile). Not felt to be active infection. - Continue ancef. Dosing in ESRD would be amenable to outpatient Tx at HD.  Diphtheroids in 1 of 4 blood cultures: - Contaminant, no Tx per ID.   ESRD:  - Continue routine HD through new TDC inserted 8/3 per nephrology  T2DM:  - Continue basal-bolus insulin AC/HS > augmented lantus due to uncontrolled hyperglycemia, but will defer permanent changes to PCP.  Chronic abdominal pain: GI consulted, felt neuropathy was a possible explanation. No acute findings on CT abd/pelvis, has reportedly normal colo last year with the [place].  - Continue gabapentin, lidoderm patch, bowel regimen - Has follow up with Dr. Havery [place] 9/19.  HTN:  - Continue home medications   Hypothyroidism:  - Continue stable dose synthroid  New L2 vertebral body subcentimeter lucent foci: Seen on CT abd/pelvis 07/15/2018. DDx includes brown tumors w/renal osteodystrophy, lytic mets or myeloma, though no other constellation of findings to feel this is likely.  - Recommend repeat imaging as outpatient to ensure stability.  Discharge Instructions Discharge Instructions    Diet -  low sodium heart healthy   Complete by:  As directed    Diet Carb Modified   Complete by:  As directed    Discharge instructions   Complete by:  As directed    Your repeat blood cultures were  negative so you will just have to complete the course of IV antibiotics at hemodialysis as an outpatient. Continue taking other medications as you were.  - For the abdominal pain, you should have regular bowel movements, so miralax and senna are prescribed for you to take regularly. You should also take gabapentin every evening and apply lidoderm patches.  - Follow up with Dr. Havery [place] on 9/19 or seek medical attention sooner if your symptoms worsen, you develop fever, chills, or redness/pain/discharge from the catheter site.   Increase activity slowly   Complete by:  As directed      Allergies as of 07/23/2018      Reactions   Shellfish Allergy Anaphylaxis   Penicillins Itching, Rash   Has patient had a PCN reaction causing immediate rash, facial/tongue/throat swelling, SOB or lightheadedness with hypotension: Yes Has patient had a PCN reaction causing severe rash involving mucus membranes or skin necrosis: No Has patient had a PCN reaction that required hospitalization: No Has patient had a PCN reaction occurring within the last 10 years: No If all of the above answers are "NO", then may proceed with Cephalosporin use.      Medication List    TAKE these medications   amLODipine 10 MG tablet Commonly known as:  NORVASC Take 10 mg by mouth at bedtime.   atorvastatin 80 MG tablet Commonly known as:  LIPITOR Take 80 mg by mouth at bedtime.   cinacalcet 60 MG tablet Commonly known as:  SENSIPAR Take 60 mg by mouth daily.   gabapentin 600 MG tablet Commonly known as:  NEURONTIN Take 0.5 tablets (300 mg total) by mouth at bedtime.   hydrALAZINE 50 MG tablet Commonly known as:  APRESOLINE Take 50 mg by mouth every 8 (eight) hours.   HYDROcodone-acetaminophen 5-325 MG tablet Commonly known as:  NORCO/VICODIN Take 1-2 tablets by mouth every 6 (six) hours as needed for moderate pain or severe pain.   insulin aspart 100 UNIT/ML injection Commonly known as:  novoLOG Inject 5 Units  into the skin 3 (three) times daily before meals.   levothyroxine 25 MCG tablet Commonly known as:  SYNTHROID, LEVOTHROID Take 25 mcg by mouth daily.   lidocaine 5 % Commonly known as:  LIDODERM Place 2 patches onto the skin daily. Remove & Discard patch within 12 hours or as directed by MD Start taking on:  07/24/2018   metoprolol tartrate 100 MG tablet Commonly known as:  LOPRESSOR Take 100 mg by mouth 2 (two) times daily.   NUTRITIONAL SUPPLEMENT PO Take 1 Scoop by mouth 2 (two) times daily as needed. Propass Powder   ENSURE CLEAR Liqd Take 1 Can by mouth as needed.   pantoprazole 40 MG tablet Commonly known as:  PROTONIX Take 1 tablet (40 mg total) by mouth daily at 6 (six) AM. Start taking on:  07/24/2018   polyethylene glycol packet Commonly known as:  MIRALAX / GLYCOLAX Take 17 g by mouth daily. Start taking on:  07/24/2018   senna 8.6 MG Tabs tablet Commonly known as:  SENOKOT Take 2 tablets (17.2 mg total) by mouth at bedtime.   sevelamer carbonate 800 MG tablet Commonly known as:  RENVELA Take 1,600 mg by mouth 3 (three) times daily before  meals.   sildenafil 100 MG tablet Commonly known as:  VIAGRA Take 100 mg by mouth as needed for erectile dysfunction (take one hour prior to activity).      Follow-up [facility]. Schedule an appointment as soon as possible for a visit in 1 week(s).   Specialty:  General Practice Contact information: [place] [id number] [phone]        Yetta Flock, MD Follow up.   Specialty:  Gastroenterology Why:  As scheduled Contact information: [address] [place] [address] [phone]          Allergies  Allergen Reactions  . Shellfish Allergy Anaphylaxis  . Penicillins Itching and Rash    Has patient had a PCN reaction causing immediate rash, facial/tongue/throat swelling, SOB or lightheadedness with hypotension: Yes Has patient had a PCN  reaction causing severe rash involving mucus membranes or skin necrosis: No Has patient had a PCN reaction that required hospitalization: No Has patient had a PCN reaction occurring within the last 10 years: No If all of the above answers are "NO", then may proceed with Cephalosporin use.     Consultations:  GI  Procedures/Studies: Ct Abdomen Pelvis Wo Contrast  Result Date: 07/15/2018 CLINICAL DATA:  52 y/o M; right lower quadrant abdominal pain radiating to the left flank for 2 months. Reports mass to the right abdomen. EXAM: CT ABDOMEN AND PELVIS WITHOUT CONTRAST TECHNIQUE: Multidetector CT imaging of the abdomen and pelvis was performed following the standard protocol without IV contrast. COMPARISON:  12/16/2010 CT abdomen and pelvis. FINDINGS: Lower chest: Severe coronary artery calcific atherosclerosis and dense mitral annular calcification. Right central venous catheter tip projects over the right atrium. 2-3 mm nodules at the lung bases, some obscured by atelectasis on the prior CT. Hepatobiliary: No focal liver abnormality is seen. No gallstones, gallbladder wall thickening, or biliary dilatation. Pancreas: Unremarkable. No pancreatic ductal dilatation or surrounding inflammatory changes. Spleen: Normal in size without focal abnormality. Adrenals/Urinary Tract: Adrenal glands are unremarkable. Atrophic kidneys bilaterally. Kidneys are otherwise normal, without renal calculi, focal lesion, or hydronephrosis. Bladder is unremarkable. Stomach/Bowel: Stomach is within normal limits. Appendix appears normal. No evidence of bowel wall thickening, distention, or inflammatory changes. Vascular/Lymphatic: Aortic atherosclerosis. No enlarged abdominal or pelvic lymph nodes. Reproductive: Prostate is unremarkable. Other: No abdominal wall hernia or abnormality. No abdominopelvic ascites. Musculoskeletal: New L2 vertebral body subcentimeter lucent foci (series 4, image 90). No acute fracture. Increased  density of bones. IMPRESSION: 1. No acute process identified. 2. 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 Differential includes lytic metastasis or myeloma. No direct connection to the disc space identified to suggest Schmorl's node. Follow-up recommended to ensure stability. 3. Severe coronary and aortic calcific atherosclerosis. Electronically Signed   By: Kristine Garbe M.D.   On: 07/15/2018 17:46   Dg Chest 2 View  Result Date: 07/15/2018 CLINICAL DATA:  Sepsis.  Right lower quadrant pain. EXAM: CHEST - 2 VIEW COMPARISON:  12/16/2010 FINDINGS: There is a right jugular dialysis catheter with the tip extending into the right atrium. Low lung volumes. Left basilar densities are most compatible with atelectasis/scar and may be chronic. Cardiac silhouette appears to be within normal limits and stable but poorly characterized on this low lung volume examination. Negative for pneumothorax. No significant airspace disease or pulmonary edema. No large pleural effusions. IMPRESSION: Low lung volumes without acute findings. Presence of a  dialysis catheter. Electronically Signed   By: Markus Daft M.D.   On: 07/15/2018 17:07   Ir Fluoro Guide Cv Line Left  Result Date: 07/19/2018 INDICATION: 52 year old male with end-stage renal disease on hemodialysis. He was previously dialyzing via a right IJ approach tunneled hemodialysis catheter, however he presented with bacteremia and the catheter was removed yesterday. He has now been nearly 24 hours on a line holiday but requires dialysis today. He presents for placement of a new left-sided tunneled hemodialysis catheter. EXAM: TUNNELED CENTRAL VENOUS HEMODIALYSIS CATHETER PLACEMENT WITH ULTRASOUND AND FLUOROSCOPIC GUIDANCE MEDICATIONS: 2 g Ancef. The antibiotic was given in an appropriate time interval prior to skin puncture. ANESTHESIA/SEDATION: Moderate (conscious) sedation was employed during  this procedure. A total of Versed 2 mg and Fentanyl 100 mcg was administered intravenously. Moderate Sedation Time: 23 minutes. The patient's level of consciousness and vital signs were monitored continuously by radiology nursing throughout the procedure under my direct supervision. FLUOROSCOPY TIME:  Fluoroscopy Time: 0 minutes 54 seconds (10 mGy). COMPLICATIONS: None immediate. PROCEDURE: Informed written consent was obtained from the patient after a discussion of the risks, benefits, and alternatives to treatment. Questions regarding the procedure were encouraged and answered. The left neck and chest were prepped with chlorhexidine in a sterile fashion, and a sterile drape was applied covering the operative field. Maximum barrier sterile technique with sterile gowns and gloves were used for the procedure. A timeout was performed prior to the initiation of the procedure. After creating a small venotomy incision, a micropuncture kit was utilized to access the left internal jugular vein under direct, real-time ultrasound guidance after the overlying soft tissues were anesthetized with 1% lidocaine with epinephrine. Ultrasound image documentation was performed. The microwire was kinked to measure appropriate catheter length. A stiff Glidewire was advanced to the level of the IVC and the micropuncture sheath was exchanged for a peel-away sheath. A palindrome tunneled hemodialysis catheter measuring 23 cm from tip to cuff was tunneled in a retrograde fashion from the anterior chest wall to the venotomy incision. The catheter was then placed through the peel-away sheath with tips ultimately positioned within the superior aspect of the right atrium. Final catheter positioning was confirmed and documented with a spot radiographic image. The catheter aspirates and flushes normally. The catheter was flushed with appropriate volume heparin dwells. The catheter exit site was secured with a 0-Prolene retention suture. The  venotomy incision was closed with Dermabond. Dressings were applied. The patient tolerated the procedure well without immediate post procedural complication. IMPRESSION: Successful placement of 23 cm tip to cuff tunneled hemodialysis catheter via the left internal jugular vein with tips terminating within the superior aspect of the right atrium. The catheter is ready for immediate use. Electronically Signed   By: Jacqulynn Cadet M.D.   On: 07/19/2018 11:07   Ir Removal Tun Cv Cath W/o Fl  Result Date: 07/18/2018 INDICATION: Request for removal of tunneled HD catheter which was placed approximately 3 years ago at [facility] in [place]. Patient admitted to [facility] for bacteremia - request to remove tunneled HD catheter today as possible infectious source with plans to replace tomorrow. EXAM: REMOVAL TUNNELED CENTRAL VENOUS CATHETER MEDICATIONS: 10 mL 2% lidocaine. ANESTHESIA/SEDATION: None. FLUOROSCOPY TIME:  None. COMPLICATIONS: None immediate. PROCEDURE: Informed written consent was obtained from the patient after a thorough discussion of the procedural risks, benefits and alternatives. All questions were addressed. Maximal Sterile Barrier Technique was utilized including mask, sterile gloves, sterile drape, hand hygiene and skin antiseptic. A  timeout was performed prior to the initiation of the procedure. The patient's right chest and catheter was prepped and draped in a normal sterile fashion. Heparin was removed from both ports of catheter. 1% lidocaine was used for local anesthesia. Using gentle blunt dissection the cuff of the catheter was exposed and the catheter was removed in it's entirety. Pressure was held until hemostasis was obtained. A sterile dressing was applied. The patient tolerated the procedure well with no immediate complications. IMPRESSION: Successful catheter removal as described above. Read by Candiss Norse, PA-C Electronically Signed   By: Jacqulynn Cadet M.D.   On: 07/18/2018 14:32    Ir US Guide Vasc Access Left  Result Date: 07/19/2018 INDICATION: 52 year old male with end-stage renal disease on hemodialysis. He was previously dialyzing via a right IJ approach tunneled hemodialysis catheter, however he presented with bacteremia and the catheter was removed yesterday. He has now been nearly 24 hours on a line holiday but requires dialysis today. He presents for placement of a new left-sided tunneled hemodialysis catheter. EXAM: TUNNELED CENTRAL VENOUS HEMODIALYSIS CATHETER PLACEMENT WITH ULTRASOUND AND FLUOROSCOPIC GUIDANCE MEDICATIONS: 2 g Ancef. The antibiotic was given in an appropriate time interval prior to skin puncture. ANESTHESIA/SEDATION: Moderate (conscious) sedation was employed during this procedure. A total of Versed 2 mg and Fentanyl 100 mcg was administered intravenously. Moderate Sedation Time: 23 minutes. The patient's level of consciousness and vital signs were monitored continuously by radiology nursing throughout the procedure under my direct supervision. FLUOROSCOPY TIME:  Fluoroscopy Time: 0 minutes 54 seconds (10 mGy). COMPLICATIONS: None immediate. PROCEDURE: Informed written consent was obtained from the patient after a discussion of the risks, benefits, and alternatives to treatment. Questions regarding the procedure were encouraged and answered. The left neck and chest were prepped with chlorhexidine in a sterile fashion, and a sterile drape was applied covering the operative field. Maximum barrier sterile technique with sterile gowns and gloves were used for the procedure. A timeout was performed prior to the initiation of the procedure. After creating a small venotomy incision, a micropuncture kit was utilized to access the left internal jugular vein under direct, real-time ultrasound guidance after the overlying soft tissues were anesthetized with 1% lidocaine with epinephrine. Ultrasound image documentation was performed. The microwire was kinked to measure  appropriate catheter length. A stiff Glidewire was advanced to the level of the IVC and the micropuncture sheath was exchanged for a peel-away sheath. A palindrome tunneled hemodialysis catheter measuring 23 cm from tip to cuff was tunneled in a retrograde fashion from the anterior chest wall to the venotomy incision. The catheter was then placed through the peel-away sheath with tips ultimately positioned within the superior aspect of the right atrium. Final catheter positioning was confirmed and documented with a spot radiographic image. The catheter aspirates and flushes normally. The catheter was flushed with appropriate volume heparin dwells. The catheter exit site was secured with a 0-Prolene retention suture. The venotomy incision was closed with Dermabond. Dressings were applied. The patient tolerated the procedure well without immediate post procedural complication. IMPRESSION: Successful placement of 23 cm tip to cuff tunneled hemodialysis catheter via the left internal jugular vein with tips terminating within the superior aspect of the right atrium. The catheter is ready for immediate use. Electronically Signed   By: Jacqulynn Cadet M.D.   On: 07/19/2018 11:07   Dg Abd 2 Views  Result Date: 07/17/2018 CLINICAL DATA:  Abdominal pain.  LEFT lower quadrant pain. EXAM: ABDOMEN - 2 VIEW COMPARISON:  None. FINDINGS: Suboptimal images due to body habitus. The bowel gas pattern is normal. There is no evidence of free air. No radio-opaque calculi or other significant radiographic abnormality is seen. IMPRESSION: Grossly negative flat and upright abdominal radiographs. Electronically Signed   By: Staci Righter M.D.   On: 07/17/2018 15:26    Echocardiogram 07/22/2018: - Study data: No prior study was available for comparison. - Left ventricle: The cavity size was normal. Wall thickness was   increased in a pattern of mild LVH. Systolic function was normal.   The estimated ejection fraction was in the  range of 60% to 65%.   Wall motion was normal; there were no regional wall motion   abnormalities. Doppler parameters are consistent with abnormal   left ventricular relaxation (grade 1 diastolic dysfunction). - Mitral valve: Calcified annulus with calcified mass (1.2x1cm) on   posterior leaflet which may represent old calcified vegetation   (fairly non-mobile).  Subjective: Abdominal pain is stable, severe, improved with medications. No nausea, vomiting. Having regular BMs and no pain with eating. No fevers, chills.   Discharge Exam: Vitals:   07/23/18 0514 07/23/18 0751  BP: 125/77 (!) 144/87  Pulse: 73 67  Resp: 18 18  Temp: 98.1 F (36.7 C) 97.9 F (36.6 C)  SpO2: 94% 97%   General: Pt is alert, awake, not in acute distress Cardiovascular: RRR, S1/S2 +, no rubs, no gallops Respiratory: CTA bilaterally, no wheezing, no rhonchi Abdominal: Soft, tender to palpation diffusely, ND, bowel sounds + Extremities: Right BKA Skin: No erythema, tenderness or discharge around former abscess site on upper back, former HD cath site on right chest or current cath site on left chest.  Labs: BNP (last 3 results) No results for input(s): BNP in the last 8760 hours. Basic Metabolic Panel: Recent Labs  Lab 07/18/18 0625 07/19/18 0415 07/21/18 0815 07/22/18 0401 07/22/18 1508  NA 135 135 136 134* 134*  K 5.3* 5.3* 5.4* 5.1 4.9  CL 94* 94* 94* 93* 93*  CO2 25 26 23 25 26   GLUCOSE 326* 309* 250* 273* 303*  BUN 39* 52* 68* 53* 58*  CREATININE 8.97* 10.81* 12.78* 10.82* 11.81*  CALCIUM 9.3 9.4 9.4 9.0 9.1  PHOS 7.8* 9.3* 9.7* 8.8* 8.9*   Liver Function Tests: Recent Labs  Lab 07/18/18 0625 07/19/18 0415 07/21/18 0815 07/22/18 0401 07/22/18 1508  ALBUMIN 3.3* 3.3* 3.4* 3.1* 3.3*   No results for input(s): LIPASE, AMYLASE in the last 168 hours. No results for input(s): AMMONIA in the last 168 hours. CBC: Recent Labs  Lab 07/17/18 0625 07/17/18 0828 07/18/18 0625  07/21/18 0815 07/22/18 1507  WBC 9.3 6.1 6.9 8.2 7.6  HGB 6.7* 11.3* 11.2* 10.9* 10.6*  HCT 21.3* 34.9* 35.0* 34.2* 33.3*  MCV 92.6 90.9 91.9 92.7 92.8  PLT 281 219 192 167 160   Cardiac Enzymes: No results for input(s): CKTOTAL, CKMB, CKMBINDEX, TROPONINI in the last 168 hours. BNP: Invalid input(s): POCBNP CBG: Recent Labs  Lab 07/22/18 0741 07/22/18 1151 07/22/18 2039 07/23/18 0749 07/23/18 1137  GLUCAP 228* 398* 237* 233* 281*   D-Dimer No results for input(s): DDIMER in the last 72 hours. Hgb A1c No results for input(s): HGBA1C in the last 72 hours. Lipid Profile No results for input(s): CHOL, HDL, LDLCALC, TRIG, CHOLHDL, LDLDIRECT in the last 72 hours. Thyroid function studies No results for input(s): TSH, T4TOTAL, T3FREE, THYROIDAB in the last 72 hours.  Invalid input(s): FREET3 Anemia work up No results for input(s): VITAMINB12, FOLATE, FERRITIN,  TIBC, IRON, RETICCTPCT in the last 72 hours. Urinalysis    Component Value Date/Time   COLORURINE YELLOW 12/15/2010 2309   APPEARANCEUR CLEAR 12/15/2010 2309   LABSPEC 1.021 12/15/2010 2309   PHURINE 6.5 12/15/2010 2309   GLUCOSEU 500 (A) 12/15/2010 2309   HGBUR LARGE (A) 12/15/2010 2309   BILIRUBINUR NEGATIVE 12/15/2010 2309   KETONESUR NEGATIVE 12/15/2010 2309   PROTEINUR >300 (A) 12/15/2010 2309   UROBILINOGEN 0.2 12/15/2010 2309   NITRITE NEGATIVE 12/15/2010 2309   LEUKOCYTESUR NEGATIVE 12/15/2010 2309    Microbiology Recent Results (from the past 240 hour(s))  Culture, blood (routine x 2)     Status: Abnormal   Collection Time: 07/15/18  4:43 PM  Result Value Ref Range Status   Specimen Description   Final    BLOOD BLOOD LEFT FOREARM Performed at [facility] [place] [address], [place], [place] [address]    Special Requests   Final    BOTTLES DRAWN AEROBIC AND ANAEROBIC Blood Culture adequate volume Performed at [facility] [address], [place], [place]  [address]    Culture  Setup Time   Final    AEROBIC BOTTLE ONLY GRAM POSITIVE RODS CRITICAL RESULT CALLED TO, READ BACK BY AND VERIFIED WITH: Karsten Ro [facility] 07/19/18 2836 JDW Performed at [facility], [place] [address], [place], [place] [address]    Culture DIPHTHEROIDS(CORYNEBACTERIUM SPECIES) (A)  Final   Report Status 07/21/2018 FINAL  Final  Culture, blood (routine x 2)     Status: Abnormal   Collection Time: 07/15/18  4:47 PM  Result Value Ref Range Status   Specimen Description   Final    BLOOD LEFT ANTECUBITAL Performed at [facility] [address] [place], [place] [address]    Special Requests   Final    BOTTLES DRAWN AEROBIC AND ANAEROBIC Blood Culture adequate volume Performed at [facility] [address], [place], [place] [address]    Culture  Setup Time   Final    GRAM POSITIVE COCCI IN CLUSTERS ANAEROBIC BOTTLE ONLY CRITICAL RESULT CALLED TO, READ BACK BY AND VERIFIED WITH: Jene Every PharmD 15:15 07/16/18 ([email or web address])    Culture (A)  Final    STAPHYLOCOCCUS SPECIES (COAGULASE NEGATIVE) THE SIGNIFICANCE OF ISOLATING THIS ORGANISM FROM A SINGLE SET OF BLOOD CULTURES WHEN MULTIPLE SETS ARE DRAWN IS UNCERTAIN. PLEASE NOTIFY THE MICROBIOLOGY DEPARTMENT WITHIN ONE WEEK IF SPECIATION AND SENSITIVITIES ARE REQUIRED. Performed at [facility], [place] [address], [place], [place] [address]    Report Status 07/19/2018 FINAL  Final  Blood Culture ID Panel (Reflexed)     Status: Abnormal   Collection Time: 07/15/18  4:47 PM  Result Value Ref Range Status   Enterococcus species NOT DETECTED NOT DETECTED Final   Listeria monocytogenes NOT DETECTED NOT DETECTED Final   Staphylococcus species DETECTED (A) NOT DETECTED Final    Comment: Methicillin (oxacillin) susceptible coagulase negative staphylococcus. Possible blood culture contaminant (unless isolated from more than one blood culture draw or clinical case suggests pathogenicity). No  antibiotic treatment is indicated for blood  culture contaminants. CRITICAL RESULT CALLED TO, READ BACK BY AND VERIFIED WITH: Jene Every PharmD 15:15 07/16/18 ([email or web address])    Staphylococcus aureus NOT DETECTED NOT DETECTED Final   Methicillin resistance NOT DETECTED NOT DETECTED Final   Streptococcus species NOT DETECTED NOT DETECTED Final   Streptococcus agalactiae NOT DETECTED NOT DETECTED Final   Streptococcus pneumoniae NOT DETECTED NOT DETECTED Final  Streptococcus pyogenes NOT DETECTED NOT DETECTED Final   Acinetobacter baumannii NOT DETECTED NOT DETECTED Final   Enterobacteriaceae species NOT DETECTED NOT DETECTED Final   Enterobacter cloacae complex NOT DETECTED NOT DETECTED Final   Escherichia coli NOT DETECTED NOT DETECTED Final   Klebsiella oxytoca NOT DETECTED NOT DETECTED Final   Klebsiella pneumoniae NOT DETECTED NOT DETECTED Final   Proteus species NOT DETECTED NOT DETECTED Final   Serratia marcescens NOT DETECTED NOT DETECTED Final   Haemophilus influenzae NOT DETECTED NOT DETECTED Final   Neisseria meningitidis NOT DETECTED NOT DETECTED Final   Pseudomonas aeruginosa NOT DETECTED NOT DETECTED Final   Candida albicans NOT DETECTED NOT DETECTED Final   Candida glabrata NOT DETECTED NOT DETECTED Final   Candida krusei NOT DETECTED NOT DETECTED Final   Candida parapsilosis NOT DETECTED NOT DETECTED Final   Candida tropicalis NOT DETECTED NOT DETECTED Final    Comment: Performed at [facility], [place] [address], [place], [place] [address]  Aerobic/Anaerobic Culture (surgical/deep wound)     Status: None   Collection Time: 07/15/18  8:53 PM  Result Value Ref Range Status   Specimen Description   Final    WOUND BACK Performed at [place] [address] [place], [place] [address]    Special Requests   Final    NONE Performed at [facility], [place] [address] [place], [place] [address]    Gram Stain   Final    RARE WBC PRESENT,  PREDOMINANTLY PMN RARE GRAM POSITIVE COCCI    Culture   Final    RARE DIPHTHEROIDS(CORYNEBACTERIUM SPECIES) NO ANAEROBES ISOLATED Performed at [facility], [place] [address] [place], [place] [address]    Report Status 07/21/2018 FINAL  Final  MRSA PCR Screening     Status: None   Collection Time: 07/16/18  3:20 AM  Result Value Ref Range Status   MRSA by PCR NEGATIVE NEGATIVE Final    Comment:        The GeneXpert MRSA Assay (FDA approved for NASAL specimens only), is one component of a comprehensive MRSA colonization surveillance program. It is not intended to diagnose MRSA infection nor to guide or monitor treatment for MRSA infections. Performed at [facility], [place] [address] [place], [place] [address]   Culture, blood (routine x 2)     Status: None (Preliminary result)   Collection Time: 07/21/18 12:45 PM  Result Value Ref Range Status   Specimen Description BLOOD LEFT ARM  Final   Special Requests   Final    BOTTLES DRAWN AEROBIC AND ANAEROBIC Blood Culture adequate volume   Culture   Final    NO GROWTH 2 DAYS Performed at [facility], [address], [place], [place] [address]    Report Status PENDING  Incomplete  Culture, blood (routine x 2)     Status: None (Preliminary result)   Collection Time: 07/21/18 12:54 PM  Result Value Ref Range Status   Specimen Description BLOOD RIGHT ANTECUBITAL  Final   Special Requests   Final    BOTTLES DRAWN AEROBIC AND ANAEROBIC Blood Culture adequate volume   Culture   Final    NO GROWTH 2 DAYS Performed at [facility], [place] [address] [place], [place] [address]    Report Status PENDING  Incomplete    Time coordinating discharge: Approximately 40 minutes  Patrecia Pour, MD  [facility] 07/23/2018, 1:58 PM Pager [phone]

## 2018-07-26 LAB — CULTURE, BLOOD (ROUTINE X 2)
Culture: NO GROWTH
Culture: NO GROWTH
Special Requests: ADEQUATE
Special Requests: ADEQUATE

## 2018-07-29 ENCOUNTER — Emergency Department (HOSPITAL_COMMUNITY)
Admission: EM | Admit: 2018-07-29 | Discharge: 2018-07-29 | Discharge status: Against Medical Advice | Payer: Medicare Other | Attending: Emergency Medicine | Admitting: Emergency Medicine

## 2018-07-29 ENCOUNTER — Encounter (HOSPITAL_COMMUNITY): Payer: Self-pay | Admitting: *Deleted

## 2018-07-29 ENCOUNTER — Emergency Department (HOSPITAL_COMMUNITY): Payer: Medicare Other

## 2018-07-29 DIAGNOSIS — L0291 Cutaneous abscess, unspecified: Secondary | ICD-10-CM | POA: Insufficient documentation

## 2018-07-29 DIAGNOSIS — Z5320 Procedure and treatment not carried out because of patient's decision for unspecified reasons: Secondary | ICD-10-CM | POA: Insufficient documentation

## 2018-07-29 DIAGNOSIS — J9811 Atelectasis: Secondary | ICD-10-CM | POA: Diagnosis not present

## 2018-07-29 DIAGNOSIS — R1030 Lower abdominal pain, unspecified: Secondary | ICD-10-CM | POA: Diagnosis not present

## 2018-07-29 LAB — COMPREHENSIVE METABOLIC PANEL
AST: 25 U/L (ref 15–41)
Albumin: 3.5 g/dL (ref 3.5–5.0)
Alkaline Phosphatase: 130 U/L — ABNORMAL HIGH (ref 38–126)
Anion gap: 14 (ref 5–15)
BILIRUBIN TOTAL: 0.7 mg/dL (ref 0.3–1.2)
BUN: 30 mg/dL — ABNORMAL HIGH (ref 6–20)
CHLORIDE: 94 mmol/L — AB (ref 98–111)
CO2: 27 mmol/L (ref 22–32)
CREATININE: 6.74 mg/dL — AB (ref 0.61–1.24)
Calcium: 9.4 mg/dL (ref 8.9–10.3)
GFR, EST AFRICAN AMERICAN: 10 mL/min — AB (ref >60)
GFR, EST NON AFRICAN AMERICAN: 8 mL/min — AB (ref >60)
Glucose, Bld: 236 mg/dL — ABNORMAL HIGH (ref 70–99)
Potassium: 4.3 mmol/L (ref 3.5–5.1)
Sodium: 135 mmol/L (ref 135–145)
TOTAL PROTEIN: 7.9 g/dL (ref 6.5–8.1)

## 2018-07-29 LAB — CBC WITH DIFFERENTIAL/PLATELET
Abs Immature Granulocytes: 0 10*3/uL (ref 0.0–0.1)
BASOS ABS: 0.1 10*3/uL (ref 0.0–0.1)
BASOS PCT: 1 %
EOS ABS: 0.2 10*3/uL (ref 0.0–0.7)
Eosinophils Relative: 4 %
HEMATOCRIT: 35.3 % — AB (ref 39.0–52.0)
Hemoglobin: 11.2 g/dL — ABNORMAL LOW (ref 13.0–17.0)
Immature Granulocytes: 1 %
LYMPHS ABS: 1.1 10*3/uL (ref 0.7–4.0)
Lymphocytes Relative: 17 %
MCH: 29.2 pg (ref 26.0–34.0)
MCHC: 31.7 g/dL (ref 30.0–36.0)
MCV: 92.2 fL (ref 78.0–100.0)
Monocytes Absolute: 0.6 10*3/uL (ref 0.1–1.0)
Monocytes Relative: 8 %
NEUTROS PCT: 69 %
Neutro Abs: 4.6 10*3/uL (ref 1.7–7.7)
PLATELETS: 228 10*3/uL (ref 150–400)
RBC: 3.83 MIL/uL — AB (ref 4.22–5.81)
RDW: 14.5 % (ref 11.5–15.5)
WBC: 6.6 10*3/uL (ref 4.0–10.5)

## 2018-07-29 LAB — I-STAT CG4 LACTIC ACID, ED: LACTIC ACID, VENOUS: 1.72 mmol/L (ref 0.5–1.9)

## 2018-07-29 NOTE — ED Notes (Signed)
Pt stated he was leaving due to pain and nausea from his dialysis. Advised pt that this ed tech could have the RN evaluate him to possibly give him nausea medicine or something to make him feel better. Pt stated that he just wanted to go home and lay down and that he would be seen at the [facility] tomorrow. Pt family member picked pt up by car.

## 2018-07-29 NOTE — ED Provider Notes (Signed)
Patient placed in Quick Look pathway, seen and evaluated   Chief Complaint: abdominal pain and abscess on back  HPI:   Pt had an I and D of cyst on back.  He feels like it is coming back  Pt reports lower abdominal pain  ROS:no fever, no chills   Physical Exam:   Gen: No distress  Neuro: Awake and Alert  Skin: Warm    Focused Exam: scarred healing area upper back   Initiation of care has begun. The patient has been counseled on the process, plan, and necessity for staying for the completion/evaluation, and the remainder of the medical screening examination   Sidney Ace 07/29/18 [place], [place], MD 08/07/18 [phone]

## 2018-07-29 NOTE — ED Notes (Signed)
Called in waiting room for vitals with no answer

## 2018-07-29 NOTE — ED Triage Notes (Signed)
Pt in c/o new abscess to his back, recently admitted and had an I and D completed on a different back abscess, states that area is still infected as well, pt is a dialysis patient, last treatment today

## 2018-07-31 ENCOUNTER — Encounter (HOSPITAL_COMMUNITY): Payer: Self-pay | Admitting: Emergency Medicine

## 2018-07-31 ENCOUNTER — Other Ambulatory Visit: Payer: Self-pay

## 2018-07-31 ENCOUNTER — Emergency Department (HOSPITAL_COMMUNITY)
Admission: EM | Admit: 2018-07-31 | Discharge: 2018-07-31 | Disposition: A | Discharge status: Home / Self Care | Payer: Medicare Other | Attending: Emergency Medicine | Admitting: Emergency Medicine

## 2018-07-31 DIAGNOSIS — E1122 Type 2 diabetes mellitus with diabetic chronic kidney disease: Secondary | ICD-10-CM | POA: Diagnosis not present

## 2018-07-31 DIAGNOSIS — Z79899 Other long term (current) drug therapy: Secondary | ICD-10-CM | POA: Diagnosis not present

## 2018-07-31 DIAGNOSIS — Z96652 Presence of left artificial knee joint: Secondary | ICD-10-CM | POA: Diagnosis not present

## 2018-07-31 DIAGNOSIS — F17210 Nicotine dependence, cigarettes, uncomplicated: Secondary | ICD-10-CM | POA: Diagnosis not present

## 2018-07-31 DIAGNOSIS — N186 End stage renal disease: Secondary | ICD-10-CM | POA: Diagnosis not present

## 2018-07-31 DIAGNOSIS — L02212 Cutaneous abscess of back [any part, except buttock]: Secondary | ICD-10-CM | POA: Insufficient documentation

## 2018-07-31 DIAGNOSIS — Z794 Long term (current) use of insulin: Secondary | ICD-10-CM | POA: Insufficient documentation

## 2018-07-31 DIAGNOSIS — N181 Chronic kidney disease, stage 1: Secondary | ICD-10-CM | POA: Diagnosis not present

## 2018-07-31 DIAGNOSIS — Z992 Dependence on renal dialysis: Secondary | ICD-10-CM | POA: Diagnosis not present

## 2018-07-31 DIAGNOSIS — R110 Nausea: Secondary | ICD-10-CM | POA: Diagnosis not present

## 2018-07-31 DIAGNOSIS — R509 Fever, unspecified: Secondary | ICD-10-CM | POA: Diagnosis not present

## 2018-07-31 DIAGNOSIS — R61 Generalized hyperhidrosis: Secondary | ICD-10-CM | POA: Diagnosis not present

## 2018-07-31 DIAGNOSIS — I120 Hypertensive chronic kidney disease with stage 5 chronic kidney disease or end stage renal disease: Secondary | ICD-10-CM | POA: Diagnosis not present

## 2018-07-31 MED ORDER — LIDOCAINE-EPINEPHRINE (PF) 2 %-1:200000 IJ SOLN
10.0000 mL | Freq: Once | INTRAMUSCULAR | Status: AC
  Administered 2018-07-31: 10 mL via INTRADERMAL
  Filled 2018-07-31: qty 20

## 2018-07-31 MED ORDER — OXYCODONE HCL 5 MG PO TABS: 5.0000 mg | ORAL_TABLET | ORAL | 0 refills | Status: AC | PRN

## 2018-07-31 NOTE — ED Notes (Signed)
Patient verbalizes understanding of discharge instructions. Opportunity for questioning and answers were provided. Armband removed by staff, pt discharged from ED via wheelchair.  

## 2018-07-31 NOTE — Discharge Instructions (Signed)
Keep wound clean and dry. Apply warm compresses throughout the day. Can take [phone] mg of Tylenol every 6 hours as needed for pain. Do not exceed 4000 mg of Tylenol daily.  Take oxycodone as needed for severe pain but do not drive, drink alcohol, or operate heavy machinery while taking this medicine as it may make you drowsy.  Followup with [facility] or in the ED in 2 days for wound recheck and packing removal.  Return to emergency department sooner for emergent changing or worsening symptoms such as fever, vomiting, or worsening swelling.  Follow-up with your physician at the [facility] with your blood cultures to see if you need to be put on any other antibiotics.

## 2018-07-31 NOTE — ED Triage Notes (Signed)
Pt c/o abscess to the upper back. Pt reports that he had an I&D and placed on abx, states his wife "pops" the abscess every morning.

## 2018-07-31 NOTE — ED Provider Notes (Signed)
[facility] EMERGENCY DEPARTMENT Provider Note   CSN: [id number] Arrival date & time: 07/31/18  1444     History   Chief Complaint Chief Complaint  Patient presents with  . Abscess    HPI [facility] is a 52 y.o. male with history of anxiety, arthritis, diabetes mellitus, BKA, neuropathy presents today with 4-day history of worsening swelling, tenderness, and abnormal drainage from an area to the upper back.  He states that he has been dealing with an abscess for over a month.  He was seen in the ED on 07/15/2018 and admitted due to failure of outpatient antibiotics.  He underwent I&D with copious drainage at the time.  Blood cultures were drawn which grew diptheroids, concern for line involvement.  He is on dialysis Tuesday Thursday Saturday.  His TDC was removed on 8/2 and dialysis catheter placed on 8/3.  He was on vancomycin initially, switched to IV Ancef and infectious disease recommended IV Ancef for 10 days following the clearance of bacteremia for line related CoNA bacteremia.  Final dose was 2 days ago with hemodialysis.  However, he states that over the past 4 days every morning when he wakes up the area will have swollen and his wife will have to "push it down ".  He states that she is able to express a fair amount of pus.  He notes an aching pain which occurs with palpation and when laying on his back.  He notes subjective fevers and chills but states his temperature has not ever exceeded 98 F.  Endorses nausea but no vomiting.  He thinks that the symptoms may be related to his insulin but states he has been compliant with his medications and his blood sugars have been running around 1 30-140.  He was seen and evaluated at the [place] yesterday who recommended I&D and packing placement but recommended that this occur in the ED in the hospital where he was admitted.  He states the blood cultures were obtained when he was seen in the [place] yesterday.  The history is provided  by the patient.    Past Medical History:  Diagnosis Date  . Anxiety   . Arthritis   . Diabetes mellitus without complication ([place])   . Hx of BKA ([place])   . Neuropathy   . Renal disorder     Patient Active Problem List   Diagnosis Date Noted  . Bacteremia associated with intravascular line ([place])   . Abscess 07/15/2018  . DM (diabetes mellitus) ([place]) 07/15/2018  . HTN (hypertension) 07/15/2018  . ESRD (end stage renal disease) ([place]) 07/15/2018  . Abdominal pain 07/15/2018  . Hx of BKA, right ([place]) 07/15/2018    Past Surgical History:  Procedure Laterality Date  . BELOW KNEE LEG AMPUTATION  2014   Right.  Due to non-healing foot ulcer.  . IR FLUORO GUIDE CV LINE LEFT  07/19/2018  . IR REMOVAL TUN CV CATH W/O FL  07/18/2018  . IR US GUIDE VASC ACCESS LEFT  07/19/2018  . REPLACEMENT TOTAL KNEE Left         Home Medications    Prior to Admission medications   Medication Sig Start Date End Date Taking? Authorizing Provider  amLODipine (NORVASC) 10 MG tablet Take 10 mg by mouth at bedtime.    [provider]  atorvastatin (LIPITOR) 80 MG tablet Take 80 mg by mouth at bedtime.    [provider]  cinacalcet (SENSIPAR) 60 MG tablet Take 60 mg by  mouth daily.    [provider]  gabapentin (NEURONTIN) 600 MG tablet Take 0.5 tablets (300 mg total) by mouth at bedtime. 07/23/18   Patrecia Pour, MD  hydrALAZINE (APRESOLINE) 50 MG tablet Take 50 mg by mouth every 8 (eight) hours.    [provider]  HYDROcodone-acetaminophen (NORCO/VICODIN) 5-325 MG tablet Take 1-2 tablets by mouth every 6 (six) hours as needed for moderate pain or severe pain. 07/23/18   Patrecia Pour, MD  insulin aspart (NOVOLOG) 100 UNIT/ML injection Inject 5 Units into the skin 3 (three) times daily before meals.    [provider]  levothyroxine (SYNTHROID, LEVOTHROID) 25 MCG tablet Take 25 mcg by mouth daily.    [provider]  lidocaine (LIDODERM) 5 % Place 2  patches onto the skin daily. Remove & Discard patch within 12 hours or as directed by MD 07/24/18   Patrecia Pour, MD  metoprolol tartrate (LOPRESSOR) 100 MG tablet Take 100 mg by mouth 2 (two) times daily.    [provider]  Nutritional Supplements (ENSURE CLEAR) LIQD Take 1 Can by mouth as needed.     [provider]  Nutritional Supplements (NUTRITIONAL SUPPLEMENT PO) Take 1 Scoop by mouth 2 (two) times daily as needed. Propass Powder    [provider]  oxyCODONE (ROXICODONE) 5 MG immediate release tablet Take 1 tablet (5 mg total) by mouth every 4 (four) hours as needed for severe pain. 07/31/18   Lilu Mcglown A, PA-C  pantoprazole (PROTONIX) 40 MG tablet Take 1 tablet (40 mg total) by mouth daily at 6 (six) AM. 07/24/18   Patrecia Pour, MD  polyethylene glycol (MIRALAX / GLYCOLAX) packet Take 17 g by mouth daily. 07/24/18   Patrecia Pour, MD  senna (SENOKOT) 8.6 MG TABS tablet Take 2 tablets (17.2 mg total) by mouth at bedtime. 07/23/18   Patrecia Pour, MD  sevelamer carbonate (RENVELA) 800 MG tablet Take 1,600 mg by mouth 3 (three) times daily before meals.    [provider]  sildenafil (VIAGRA) 100 MG tablet Take 100 mg by mouth as needed for erectile dysfunction (take one hour prior to activity).    [provider]    Family History Family History  Problem Relation Age of Onset  . Arthritis Mother   . Heart attack Father   . Hypertension Sister     Social History Social History   Tobacco Use  . Smoking status: Current Some Day Smoker    Types: Cigars  . Smokeless tobacco: Never Used  Substance Use Topics  . Alcohol use: Not Currently  . Drug use: Not Currently     Allergies   Shellfish allergy and Penicillins   Review of Systems Review of Systems  Constitutional: Positive for chills, diaphoresis and fever.  Gastrointestinal: Positive for nausea. Negative for vomiting.  Skin: Positive for wound.     Physical Exam Updated Vital  Signs BP (!) 124/99 (BP Location: Right Arm)   Pulse 100   Temp 98.7 F (37.1 C) (Oral)   Resp 18   SpO2 96%   Physical Exam  Constitutional: He appears well-developed and well-nourished. No distress.  HENT:  Head: Normocephalic and atraumatic.  Eyes: Conjunctivae are normal. Right eye exhibits no discharge. Left eye exhibits no discharge.  Neck: No JVD present. No tracheal deviation present.  Cardiovascular: Normal rate.  Pulmonary/Chest: Effort normal.  Abdominal: He exhibits no distension.  Musculoskeletal: He exhibits no edema.  Right BKA  Neurological:  He is alert.  Skin: Skin is warm and dry. No erythema.  1cm area of swelling and purulence with surrounding mild induration to the upper back.  Able to express purulent material on palpation.  No surrounding erythema.  Psychiatric: He has a normal mood and affect. His behavior is normal.  Nursing note and vitals reviewed.      ED Treatments / Results  Labs (all labs ordered are listed, but only abnormal results are displayed) Labs Reviewed - No data to display  EKG None  Radiology No results found.  Procedures .Marland KitchenIncision and Drainage Date/Time: 07/31/2018 5:55 PM Performed by: Renita Papa, PA-C Authorized by: Renita Papa, PA-C   Consent:    Consent obtained:  Verbal   Consent given by:  Patient   Risks discussed:  Bleeding, incomplete drainage, infection and pain Location:    Type:  Abscess   Size:  1cm   Location:  Trunk   Trunk location:  Back Pre-procedure details:    Skin preparation:  Betadine Anesthesia (see MAR for exact dosages):    Anesthesia method:  Local infiltration   Local anesthetic:  Lidocaine 2% WITH epi Procedure type:    Complexity:  Complex Procedure details:    Needle aspiration: no     Incision types:  Stab incision   Incision depth:  Subcutaneous   Scalpel blade:  11   Wound management:  Probed and deloculated, irrigated with saline and extensive cleaning   Drainage:   Purulent   Drainage amount:  Copious   Wound treatment:  Drain placed   Packing materials:  1/4 in gauze Post-procedure details:    Patient tolerance of procedure:  Tolerated well, no immediate complications   (including critical care time)  Medications Ordered in ED Medications  lidocaine-EPINEPHrine (XYLOCAINE W/EPI) 2 %-1:200000 (PF) injection 10 mL (10 mLs Intradermal Given by Other 07/31/18 1738)     Initial Impression / Assessment and Plan / ED Course  I have reviewed the triage vital signs and the nursing notes.  Pertinent labs & imaging results that were available during my care of the patient were reviewed by me and considered in my medical decision making (see chart for details).     Patient presents for evaluation of recurrent abscess.  He is afebrile, vital signs are stable.  He is nontoxic in appearance.  Recently completed a course of IV Ancef after discharge from the hospital on 07/23/2018.  Most recent set of blood cultures was negative.  He does not appear to be overtly septic at this time.  ED note from his initial admission mentions that this abscess is likely chronic in nature.  He may benefit from general surgery or dermatology follow-up.  The area itself is not particularly erythematous, warm, or indurated and generally appears well-healing.  He underwent I&D with copious purulent discharge noted.  Packing was placed.  He has close follow-up with his physicians at the [place] and blood cultures were obtained yesterday.  He has been on multiple antibiotics in the past month or so with close ID follow-up.  Again he does not appear to be overtly septic and I have a low suspicion of endocarditis.  He will follow-up in 2 days for wound recheck and packing removal.  Discussed indications for return to the ED sooner.  Discussed with attending Dr. Francia Greaves who agrees with assessment and plan at this time. Final Clinical Impressions(s) / ED Diagnoses   Final diagnoses:  Back abscess     ED Discharge  Orders         Ordered    oxyCODONE (ROXICODONE) 5 MG immediate release tablet  Every 4 hours PRN     07/31/18 1801           Renita Papa, PA-C 07/31/18 1932    Valarie Merino, MD 08/01/18 0001

## 2018-07-31 NOTE — ED Provider Notes (Signed)
Patient placed in Quick Look pathway, seen and evaluated   Chief Complaint: abscess  HPI:   [facility] is a 52 y.o. Pt c/o abscess to the upper back. Pt reports that he had an I&D and placed on abx, states his wife "pops" the abscess every morning. He reports his doctor told him to come in for I&D  ROS: skin: abscess  Physical Exam:  BP (!) 124/99 (BP Location: Right Arm)   Pulse 100   Temp 98.7 F (37.1 C) (Oral)   Resp 18   SpO2 96%    Gen: No distress  Neuro: Awake and Alert  Skin: area to the upper back raised, tender with drainage.     Initiation of care has begun. The patient has been counseled on the process, plan, and necessity for staying for the completion/evaluation, and the remainder of the medical screening examination    Ashley Murrain, NP 07/31/18 1558    Julianne Rice, MD 08/01/18 [phone]

## 2018-08-02 ENCOUNTER — Ambulatory Visit (HOSPITAL_COMMUNITY)
Admission: EM | Admit: 2018-08-02 | Discharge: 2018-08-02 | Disposition: A | Discharge status: Home / Self Care | Payer: Medicare Other | Attending: Family Medicine | Admitting: Family Medicine

## 2018-08-02 ENCOUNTER — Other Ambulatory Visit: Payer: Self-pay

## 2018-08-02 DIAGNOSIS — L02212 Cutaneous abscess of back [any part, except buttock]: Secondary | ICD-10-CM | POA: Diagnosis not present

## 2018-08-02 DIAGNOSIS — Z4800 Encounter for change or removal of nonsurgical wound dressing: Secondary | ICD-10-CM

## 2018-08-02 DIAGNOSIS — L03312 Cellulitis of back [any part except buttock]: Secondary | ICD-10-CM

## 2018-08-02 DIAGNOSIS — Z09 Encounter for follow-up examination after completed treatment for conditions other than malignant neoplasm: Secondary | ICD-10-CM

## 2018-08-02 MED ORDER — MUPIROCIN 2 % EX OINT: 1.0000 "application " | TOPICAL_OINTMENT | Freq: Two times a day (BID) | CUTANEOUS | 0 refills | Status: AC

## 2018-08-02 NOTE — Discharge Instructions (Signed)
Packing removed. Continue to dress wound daily, you can use bactroban to dress to prevent sticking to the gauze. Monitor for any worsening symptoms. Follow up with PCP for recheck and monitoring of wound healing. If experiencing worsening symptoms, spreading redness, increased warmth, fever, follow up for reevaluation needed.

## 2018-08-02 NOTE — ED Triage Notes (Signed)
Pt presents today for packing removal in his back. States he was seen at [facility] ED where they cut open an abscess and put packing in it and was told to come to [facility] for the removal of packing and to check wound.

## 2018-08-02 NOTE — ED Provider Notes (Signed)
[place]    CSN: [id number] Arrival date & time: 08/02/18  1350     History   Chief Complaint Chief Complaint  Patient presents with  . Follow-up    HPI David Walsh is a 52 y.o. male.   52 year old male with history of anxiety, arthritis, DM, BKA, neuropathy comes in for removal of packing and wound recheck.  Patient was seen in the emergency department 08/01/2018 for abscess drainage.  He had recently been hospitalized for failing outpatient antibiotics due to abscess and possible infection to his line.  He was given 10 days of IV antibiotics.  Prior to drainage at the emergency department 2 days ago, he was seen at the [place], and blood cultures were negative.  He had been afebrile, with T-max of 98.  I&D was done, given no surrounding cellulitis, was not put on antibiotics.  During the past 2 days, he remains febrile without significant changes.  He was given oxycodone for pain, and states that it does make him drowsy, but has not had worsening weakness, dizziness.  He has had daily dressings by his wife, who states that the wound appears to be getting better.  Last dressing this morning.     Past Medical History:  Diagnosis Date  . Anxiety   . Arthritis   . Diabetes mellitus without complication ([place])   . Hx of BKA ([place])   . Neuropathy   . Renal disorder     Patient Active Problem List   Diagnosis Date Noted  . Bacteremia associated with intravascular line ([place])   . Abscess 07/15/2018  . DM (diabetes mellitus) ([place]) 07/15/2018  . HTN (hypertension) 07/15/2018  . ESRD (end stage renal disease) ([place]) 07/15/2018  . Abdominal pain 07/15/2018  . Hx of BKA, right ([place]) 07/15/2018    Past Surgical History:  Procedure Laterality Date  . BELOW KNEE LEG AMPUTATION  2014   Right.  Due to non-healing foot ulcer.  . IR FLUORO GUIDE CV LINE LEFT  07/19/2018  . IR REMOVAL TUN CV CATH W/O FL  07/18/2018  . IR US GUIDE VASC ACCESS LEFT  07/19/2018  . REPLACEMENT TOTAL KNEE  Left        Home Medications    Prior to Admission medications   Medication Sig Start Date End Date Taking? Authorizing Provider  amLODipine (NORVASC) 10 MG tablet Take 10 mg by mouth at bedtime.   Yes [provider]  atorvastatin (LIPITOR) 80 MG tablet Take 80 mg by mouth at bedtime.   Yes [provider]  cinacalcet (SENSIPAR) 60 MG tablet Take 60 mg by mouth daily.   Yes [provider]  gabapentin (NEURONTIN) 600 MG tablet Take 0.5 tablets (300 mg total) by mouth at bedtime. 07/23/18  Yes Patrecia Pour, MD  hydrALAZINE (APRESOLINE) 50 MG tablet Take 50 mg by mouth every 8 (eight) hours.   Yes [provider]  HYDROcodone-acetaminophen (NORCO/VICODIN) 5-325 MG tablet Take 1-2 tablets by mouth every 6 (six) hours as needed for moderate pain or severe pain. 07/23/18  Yes Patrecia Pour, MD  insulin aspart (NOVOLOG) 100 UNIT/ML injection Inject 5 Units into the skin 3 (three) times daily before meals.   Yes [provider]  levothyroxine (SYNTHROID, LEVOTHROID) 25 MCG tablet Take 25 mcg by mouth daily.   Yes [provider]  lidocaine (LIDODERM) 5 % Place 2 patches onto the skin daily. Remove & Discard patch within 12 hours or as directed by MD 07/24/18  Yes Patrecia Pour, MD  metoprolol tartrate (LOPRESSOR) 100 MG tablet Take 100 mg by mouth 2 (two) times daily.   Yes [provider]  Nutritional Supplements (ENSURE CLEAR) LIQD Take 1 Can by mouth as needed.    Yes [provider]  Nutritional Supplements (NUTRITIONAL SUPPLEMENT PO) Take 1 Scoop by mouth 2 (two) times daily as needed. Propass Powder   Yes [provider]  oxyCODONE (ROXICODONE) 5 MG immediate release tablet Take 1 tablet (5 mg total) by mouth every 4 (four) hours as needed for severe pain. 07/31/18  Yes Fawze, Mina A, PA-C  pantoprazole (PROTONIX) 40 MG tablet Take 1 tablet (40 mg total) by mouth daily at 6 (six) AM. 07/24/18  Yes Patrecia Pour, MD    polyethylene glycol (MIRALAX / GLYCOLAX) packet Take 17 g by mouth daily. 07/24/18  Yes Patrecia Pour, MD  senna (SENOKOT) 8.6 MG TABS tablet Take 2 tablets (17.2 mg total) by mouth at bedtime. 07/23/18  Yes Patrecia Pour, MD  sevelamer carbonate (RENVELA) 800 MG tablet Take 1,600 mg by mouth 3 (three) times daily before meals.   Yes [provider]  sildenafil (VIAGRA) 100 MG tablet Take 100 mg by mouth as needed for erectile dysfunction (take one hour prior to activity).   Yes [provider]  mupirocin ointment (BACTROBAN) 2 % Apply 1 application topically 2 (two) times daily. 08/02/18   Ok Edwards, PA-C    Family History Family History  Problem Relation Age of Onset  . Arthritis Mother   . Heart attack Father   . Hypertension Sister     Social History Social History   Tobacco Use  . Smoking status: Current Some Day Smoker    Types: Cigars  . Smokeless tobacco: Never Used  Substance Use Topics  . Alcohol use: Not Currently  . Drug use: Not Currently     Allergies   Shellfish allergy and Penicillins   Review of Systems Review of Systems  Reason unable to perform ROS: See HPI as above.     Physical Exam Triage Vital Signs ED Triage Vitals  Enc Vitals Group     BP 08/02/18 1414 93/73     Pulse Rate 08/02/18 1414 95     Resp 08/02/18 1414 18     Temp 08/02/18 1414 98.3 F (36.8 C)     Temp Source 08/02/18 1414 Oral     SpO2 08/02/18 1414 95 %     Weight --      Height --      Head Circumference --      Peak Flow --      Pain Score 08/02/18 1418 8     Pain Loc --      Pain Edu? --      Excl. in [place]? --    No data found.  Updated Vital Signs BP 93/73 (BP Location: Right Arm)   Pulse 95   Temp 98.3 F (36.8 C) (Oral)   Resp 18   SpO2 95%   Physical Exam  Constitutional: He is oriented to person, place, and time. He appears well-developed and well-nourished. No distress.  HENT:  Head: Normocephalic and atraumatic.  Eyes: Pupils are  equal, round, and reactive to light. Conjunctivae are normal.  Neurological: He is alert and oriented to person, place, and time.  Skin: He is not diaphoretic.  See picture below. Surrounding hyperpigmentation without erythema, increased warmth.  UC Treatments / Results  Labs (all labs ordered are listed, but only abnormal results are displayed) Labs Reviewed - No data to display  EKG None  Radiology No results found.  Procedures Procedures (including critical care time)  Medications Ordered in UC Medications - No data to display  Initial Impression / Assessment and Plan / UC Course  I have reviewed the triage vital signs and the nursing notes.  Pertinent labs & imaging results that were available during my care of the patient were reviewed by me and considered in my medical decision making (see chart for details).    Patient requested lidocaine prior to packing removal.  Lidocaine 2% without epi used to anesthetize surrounding tissue.  Mild drainage from packing, removed without difficulty/complications.  Mild purulent drainage from abscess wound.  Probed and loculated.  Patient tolerated well.  Patient continues without surrounding cellulitis.  Afebrile, nontoxic in appearance.  Will have patient continue with wound care, and follow-up with PCP early next week for recheck.  Return precautions given.  Patient expresses understanding and agrees to plan.  Case discussed with Dr. Mannie Stabile, who agrees to plan.  Final Clinical Impressions(s) / UC Diagnoses   Final diagnoses:  Encounter for recheck of abscess following incision and drainage    ED Prescriptions    Medication Sig Dispense Auth. Provider   mupirocin ointment (BACTROBAN) 2 % Apply 1 application topically 2 (two) times daily. 22 g Tobin Chad, [place] 08/03/18 1014

## 2018-08-25 ENCOUNTER — Emergency Department (HOSPITAL_COMMUNITY)
Admission: EM | Admit: 2018-08-25 | Discharge: 2018-08-25 | Disposition: A | Discharge status: Home / Self Care | Payer: Medicare Other | Source: Home / Self Care

## 2018-08-25 ENCOUNTER — Encounter (HOSPITAL_COMMUNITY): Payer: Self-pay | Admitting: Emergency Medicine

## 2018-08-25 ENCOUNTER — Other Ambulatory Visit: Payer: Self-pay

## 2018-08-25 DIAGNOSIS — Z452 Encounter for adjustment and management of vascular access device: Secondary | ICD-10-CM | POA: Insufficient documentation

## 2018-08-25 DIAGNOSIS — N2581 Secondary hyperparathyroidism of renal origin: Secondary | ICD-10-CM | POA: Diagnosis not present

## 2018-08-25 DIAGNOSIS — I5033 Acute on chronic diastolic (congestive) heart failure: Secondary | ICD-10-CM | POA: Diagnosis not present

## 2018-08-25 DIAGNOSIS — Z5321 Procedure and treatment not carried out due to patient leaving prior to being seen by health care provider: Secondary | ICD-10-CM

## 2018-08-25 DIAGNOSIS — T8241XA Breakdown (mechanical) of vascular dialysis catheter, initial encounter: Secondary | ICD-10-CM | POA: Diagnosis not present

## 2018-08-25 DIAGNOSIS — I132 Hypertensive heart and chronic kidney disease with heart failure and with stage 5 chronic kidney disease, or end stage renal disease: Secondary | ICD-10-CM | POA: Diagnosis not present

## 2018-08-25 DIAGNOSIS — N186 End stage renal disease: Secondary | ICD-10-CM | POA: Diagnosis not present

## 2018-08-25 DIAGNOSIS — R0602 Shortness of breath: Secondary | ICD-10-CM | POA: Diagnosis not present

## 2018-08-25 DIAGNOSIS — J810 Acute pulmonary edema: Secondary | ICD-10-CM | POA: Diagnosis not present

## 2018-08-25 DIAGNOSIS — J811 Chronic pulmonary edema: Secondary | ICD-10-CM | POA: Diagnosis not present

## 2018-08-25 LAB — I-STAT CHEM 8, ED
BUN: 78 mg/dL — ABNORMAL HIGH (ref 6–20)
CHLORIDE: 100 mmol/L (ref 98–111)
Calcium, Ion: 1.12 mmol/L — ABNORMAL LOW (ref 1.15–1.40)
Creatinine, Ser: 16.6 mg/dL — ABNORMAL HIGH (ref 0.61–1.24)
Glucose, Bld: 87 mg/dL (ref 70–99)
HCT: 32 % — ABNORMAL LOW (ref 39.0–52.0)
Hemoglobin: 10.9 g/dL — ABNORMAL LOW (ref 13.0–17.0)
POTASSIUM: 5 mmol/L (ref 3.5–5.1)
SODIUM: 135 mmol/L (ref 135–145)
TCO2: 23 mmol/L (ref 22–32)

## 2018-08-25 LAB — CBC
HEMATOCRIT: 34.3 % — AB (ref 39.0–52.0)
Hemoglobin: 10.9 g/dL — ABNORMAL LOW (ref 13.0–17.0)
MCH: 28.9 pg (ref 26.0–34.0)
MCHC: 31.8 g/dL (ref 30.0–36.0)
MCV: 91 fL (ref 78.0–100.0)
PLATELETS: 191 10*3/uL (ref 150–400)
RBC: 3.77 MIL/uL — AB (ref 4.22–5.81)
RDW: 13.7 % (ref 11.5–15.5)
WBC: 7.4 10*3/uL (ref 4.0–10.5)

## 2018-08-25 NOTE — ED Notes (Signed)
Pt came to desk stating that he was feeling better and that he was going to go home. Pt stated that if he felt worse and anything changed he would come back in the morning.

## 2018-08-25 NOTE — ED Triage Notes (Signed)
Pt presents with issues with dialysis catheter to L chest; T TH SAT, missed Saturday and went to the dialysis center today to get dialysis but they said his catheter was clogged; pt denies pain or other complaints; states needs to be fixed before dialysis tomorrow

## 2018-08-26 ENCOUNTER — Encounter (HOSPITAL_COMMUNITY): Payer: Self-pay | Admitting: *Deleted

## 2018-08-26 ENCOUNTER — Other Ambulatory Visit: Payer: Self-pay

## 2018-08-26 ENCOUNTER — Emergency Department (HOSPITAL_COMMUNITY)
Admission: EM | Admit: 2018-08-26 | Discharge: 2018-08-26 | Disposition: A | Discharge status: Home / Self Care | Payer: Medicare Other | Source: Home / Self Care

## 2018-08-26 DIAGNOSIS — N186 End stage renal disease: Secondary | ICD-10-CM

## 2018-08-26 DIAGNOSIS — Z4902 Encounter for fitting and adjustment of peritoneal dialysis catheter: Secondary | ICD-10-CM

## 2018-08-26 DIAGNOSIS — Z5321 Procedure and treatment not carried out due to patient leaving prior to being seen by health care provider: Secondary | ICD-10-CM | POA: Insufficient documentation

## 2018-08-26 LAB — CBG MONITORING, ED: Glucose-Capillary: 54 mg/dL — ABNORMAL LOW (ref 70–99)

## 2018-08-26 NOTE — ED Notes (Signed)
I called diaysis about who and how to unclog his perm cath and that sugar was 54, pt was wanting to eat , per dialysis  Pt needs to wai=t before he eats to make sure he dosent need to go to IR to have cath fixed , I went to tell pt and he was already eating graham crackers and applesauce and then he told me he broke hid tooth and that he was going to [place] to see the dnetis , I asked him to please wait before he goes to [place] and he said he  Would , went to place pt in room and he cannot be found, per a visitor pt left to go to [place] after some one who she thought was dialysis came down to see him  , spoke to him and then he told her he was leaving to get tooth fixed , she told him to come back to ER

## 2018-08-26 NOTE — ED Notes (Addendum)
Registration stated they saw ID volunteer cut bracelet off Pt and Pt left. Pt has been moved OTF.

## 2018-08-26 NOTE — ED Notes (Signed)
Called pt in waiting room, no answer. Asked tech first Aleksa if she sees pt in room and she did not. Will go back in 5-10 to call.

## 2018-08-26 NOTE — ED Triage Notes (Signed)
Pt in stating that his dilaysis catheter to his chest is clogged, went Saturday and they did part of his treatment before they started having issues with his catheter, came in last night to try and have it fixed so he could stay on track with dialysis but was unable to have it fixed last night, denies complaints at this time

## 2018-08-26 NOTE — ED Notes (Signed)
Nurse First notified. Pt given crackers, peanut butter, and apple juice.

## 2018-08-27 ENCOUNTER — Inpatient Hospital Stay (HOSPITAL_COMMUNITY)
Admission: EM | Admit: 2018-08-27 | Discharge: 2018-08-30 | DRG: 286 | Disposition: A | Discharge status: Home / Self Care | Payer: Medicare Other | Attending: Internal Medicine | Admitting: Internal Medicine

## 2018-08-27 ENCOUNTER — Emergency Department (HOSPITAL_COMMUNITY): Payer: Medicare Other

## 2018-08-27 ENCOUNTER — Encounter (HOSPITAL_COMMUNITY): Payer: Self-pay

## 2018-08-27 ENCOUNTER — Other Ambulatory Visit: Payer: Self-pay

## 2018-08-27 DIAGNOSIS — Z8249 Family history of ischemic heart disease and other diseases of the circulatory system: Secondary | ICD-10-CM

## 2018-08-27 DIAGNOSIS — Y712 Prosthetic and other implants, materials and accessory cardiovascular devices associated with adverse incidents: Secondary | ICD-10-CM | POA: Diagnosis present

## 2018-08-27 DIAGNOSIS — E8770 Fluid overload, unspecified: Secondary | ICD-10-CM | POA: Diagnosis present

## 2018-08-27 DIAGNOSIS — N186 End stage renal disease: Secondary | ICD-10-CM

## 2018-08-27 DIAGNOSIS — E1165 Type 2 diabetes mellitus with hyperglycemia: Secondary | ICD-10-CM | POA: Diagnosis present

## 2018-08-27 DIAGNOSIS — Z91013 Allergy to seafood: Secondary | ICD-10-CM

## 2018-08-27 DIAGNOSIS — I160 Hypertensive urgency: Secondary | ICD-10-CM | POA: Diagnosis present

## 2018-08-27 DIAGNOSIS — E039 Hypothyroidism, unspecified: Secondary | ICD-10-CM | POA: Diagnosis present

## 2018-08-27 DIAGNOSIS — Z992 Dependence on renal dialysis: Secondary | ICD-10-CM

## 2018-08-27 DIAGNOSIS — Z96652 Presence of left artificial knee joint: Secondary | ICD-10-CM | POA: Diagnosis present

## 2018-08-27 DIAGNOSIS — Z880 Allergy status to penicillin: Secondary | ICD-10-CM

## 2018-08-27 DIAGNOSIS — E1129 Type 2 diabetes mellitus with other diabetic kidney complication: Secondary | ICD-10-CM

## 2018-08-27 DIAGNOSIS — E1122 Type 2 diabetes mellitus with diabetic chronic kidney disease: Secondary | ICD-10-CM | POA: Diagnosis present

## 2018-08-27 DIAGNOSIS — I5033 Acute on chronic diastolic (congestive) heart failure: Secondary | ICD-10-CM | POA: Diagnosis present

## 2018-08-27 DIAGNOSIS — J811 Chronic pulmonary edema: Secondary | ICD-10-CM | POA: Diagnosis not present

## 2018-08-27 DIAGNOSIS — F17290 Nicotine dependence, other tobacco product, uncomplicated: Secondary | ICD-10-CM | POA: Diagnosis present

## 2018-08-27 DIAGNOSIS — R0602 Shortness of breath: Secondary | ICD-10-CM | POA: Diagnosis not present

## 2018-08-27 DIAGNOSIS — E1142 Type 2 diabetes mellitus with diabetic polyneuropathy: Secondary | ICD-10-CM | POA: Diagnosis present

## 2018-08-27 DIAGNOSIS — Z89511 Acquired absence of right leg below knee: Secondary | ICD-10-CM

## 2018-08-27 DIAGNOSIS — Z9115 Patient's noncompliance with renal dialysis: Secondary | ICD-10-CM

## 2018-08-27 DIAGNOSIS — Z79899 Other long term (current) drug therapy: Secondary | ICD-10-CM

## 2018-08-27 DIAGNOSIS — Z6832 Body mass index (BMI) 32.0-32.9, adult: Secondary | ICD-10-CM

## 2018-08-27 DIAGNOSIS — E875 Hyperkalemia: Secondary | ICD-10-CM | POA: Diagnosis present

## 2018-08-27 DIAGNOSIS — T8241XA Breakdown (mechanical) of vascular dialysis catheter, initial encounter: Secondary | ICD-10-CM

## 2018-08-27 DIAGNOSIS — D631 Anemia in chronic kidney disease: Secondary | ICD-10-CM | POA: Diagnosis present

## 2018-08-27 DIAGNOSIS — Z79890 Hormone replacement therapy (postmenopausal): Secondary | ICD-10-CM

## 2018-08-27 DIAGNOSIS — M1990 Unspecified osteoarthritis, unspecified site: Secondary | ICD-10-CM | POA: Diagnosis present

## 2018-08-27 DIAGNOSIS — J810 Acute pulmonary edema: Secondary | ICD-10-CM

## 2018-08-27 DIAGNOSIS — N2581 Secondary hyperparathyroidism of renal origin: Secondary | ICD-10-CM | POA: Diagnosis present

## 2018-08-27 DIAGNOSIS — Z794 Long term (current) use of insulin: Secondary | ICD-10-CM

## 2018-08-27 DIAGNOSIS — I132 Hypertensive heart and chronic kidney disease with heart failure and with stage 5 chronic kidney disease, or end stage renal disease: Principal | ICD-10-CM | POA: Diagnosis present

## 2018-08-27 NOTE — ED Triage Notes (Signed)
Pt states that he has not had dialysis since last Thursday due to his port in his port being clogged, pt states that he keeps getting told to return here to have it fixed, pt SOB, lung sounds diminished

## 2018-08-28 ENCOUNTER — Other Ambulatory Visit: Payer: Self-pay

## 2018-08-28 ENCOUNTER — Encounter (HOSPITAL_COMMUNITY): Payer: Self-pay | Admitting: Internal Medicine

## 2018-08-28 DIAGNOSIS — Z8249 Family history of ischemic heart disease and other diseases of the circulatory system: Secondary | ICD-10-CM | POA: Diagnosis not present

## 2018-08-28 DIAGNOSIS — Z89511 Acquired absence of right leg below knee: Secondary | ICD-10-CM | POA: Diagnosis not present

## 2018-08-28 DIAGNOSIS — E8770 Fluid overload, unspecified: Secondary | ICD-10-CM | POA: Diagnosis not present

## 2018-08-28 DIAGNOSIS — Z96652 Presence of left artificial knee joint: Secondary | ICD-10-CM | POA: Diagnosis present

## 2018-08-28 DIAGNOSIS — Z880 Allergy status to penicillin: Secondary | ICD-10-CM | POA: Diagnosis not present

## 2018-08-28 DIAGNOSIS — Z6832 Body mass index (BMI) 32.0-32.9, adult: Secondary | ICD-10-CM | POA: Diagnosis not present

## 2018-08-28 DIAGNOSIS — D631 Anemia in chronic kidney disease: Secondary | ICD-10-CM | POA: Diagnosis not present

## 2018-08-28 DIAGNOSIS — E1165 Type 2 diabetes mellitus with hyperglycemia: Secondary | ICD-10-CM | POA: Diagnosis present

## 2018-08-28 DIAGNOSIS — N186 End stage renal disease: Secondary | ICD-10-CM

## 2018-08-28 DIAGNOSIS — N2581 Secondary hyperparathyroidism of renal origin: Secondary | ICD-10-CM | POA: Diagnosis not present

## 2018-08-28 DIAGNOSIS — R0602 Shortness of breath: Secondary | ICD-10-CM | POA: Diagnosis not present

## 2018-08-28 DIAGNOSIS — E1122 Type 2 diabetes mellitus with diabetic chronic kidney disease: Secondary | ICD-10-CM | POA: Diagnosis present

## 2018-08-28 DIAGNOSIS — Y712 Prosthetic and other implants, materials and accessory cardiovascular devices associated with adverse incidents: Secondary | ICD-10-CM | POA: Diagnosis present

## 2018-08-28 DIAGNOSIS — J810 Acute pulmonary edema: Secondary | ICD-10-CM | POA: Diagnosis not present

## 2018-08-28 DIAGNOSIS — E039 Hypothyroidism, unspecified: Secondary | ICD-10-CM | POA: Diagnosis present

## 2018-08-28 DIAGNOSIS — T8249XA Other complication of vascular dialysis catheter, initial encounter: Secondary | ICD-10-CM | POA: Diagnosis not present

## 2018-08-28 DIAGNOSIS — F17290 Nicotine dependence, other tobacco product, uncomplicated: Secondary | ICD-10-CM | POA: Diagnosis present

## 2018-08-28 DIAGNOSIS — I132 Hypertensive heart and chronic kidney disease with heart failure and with stage 5 chronic kidney disease, or end stage renal disease: Secondary | ICD-10-CM | POA: Diagnosis present

## 2018-08-28 DIAGNOSIS — E1142 Type 2 diabetes mellitus with diabetic polyneuropathy: Secondary | ICD-10-CM | POA: Diagnosis present

## 2018-08-28 DIAGNOSIS — E875 Hyperkalemia: Secondary | ICD-10-CM | POA: Diagnosis not present

## 2018-08-28 DIAGNOSIS — Z992 Dependence on renal dialysis: Secondary | ICD-10-CM | POA: Diagnosis not present

## 2018-08-28 DIAGNOSIS — Z9115 Patient's noncompliance with renal dialysis: Secondary | ICD-10-CM | POA: Diagnosis not present

## 2018-08-28 DIAGNOSIS — I160 Hypertensive urgency: Secondary | ICD-10-CM

## 2018-08-28 DIAGNOSIS — I120 Hypertensive chronic kidney disease with stage 5 chronic kidney disease or end stage renal disease: Secondary | ICD-10-CM | POA: Diagnosis not present

## 2018-08-28 DIAGNOSIS — M1990 Unspecified osteoarthritis, unspecified site: Secondary | ICD-10-CM | POA: Diagnosis present

## 2018-08-28 DIAGNOSIS — I5033 Acute on chronic diastolic (congestive) heart failure: Secondary | ICD-10-CM | POA: Diagnosis present

## 2018-08-28 DIAGNOSIS — T8241XA Breakdown (mechanical) of vascular dialysis catheter, initial encounter: Secondary | ICD-10-CM | POA: Diagnosis present

## 2018-08-28 LAB — CBC WITH DIFFERENTIAL/PLATELET
Abs Immature Granulocytes: 0 10*3/uL (ref 0.0–0.1)
BASOS PCT: 1 %
Basophils Absolute: 0 10*3/uL (ref 0.0–0.1)
EOS ABS: 0.3 10*3/uL (ref 0.0–0.7)
EOS PCT: 4 %
HCT: 32.7 % — ABNORMAL LOW (ref 39.0–52.0)
Hemoglobin: 10.3 g/dL — ABNORMAL LOW (ref 13.0–17.0)
IMMATURE GRANULOCYTES: 0 %
Lymphocytes Relative: 18 %
Lymphs Abs: 1.4 10*3/uL (ref 0.7–4.0)
MCH: 29 pg (ref 26.0–34.0)
MCHC: 31.5 g/dL (ref 30.0–36.0)
MCV: 92.1 fL (ref 78.0–100.0)
MONOS PCT: 8 %
Monocytes Absolute: 0.6 10*3/uL (ref 0.1–1.0)
NEUTROS PCT: 69 %
Neutro Abs: 5.2 10*3/uL (ref 1.7–7.7)
PLATELETS: 186 10*3/uL (ref 150–400)
RBC: 3.55 MIL/uL — ABNORMAL LOW (ref 4.22–5.81)
RDW: 14 % (ref 11.5–15.5)
WBC: 7.6 10*3/uL (ref 4.0–10.5)

## 2018-08-28 LAB — BASIC METABOLIC PANEL
ANION GAP: 21 — AB (ref 5–15)
BUN: 90 mg/dL — ABNORMAL HIGH (ref 6–20)
CALCIUM: 9.4 mg/dL (ref 8.9–10.3)
CO2: 19 mmol/L — ABNORMAL LOW (ref 22–32)
Chloride: 98 mmol/L (ref 98–111)
Creatinine, Ser: 18.35 mg/dL — ABNORMAL HIGH (ref 0.61–1.24)
GFR calc Af Amer: 3 mL/min — ABNORMAL LOW (ref >60)
GFR, EST NON AFRICAN AMERICAN: 3 mL/min — AB (ref >60)
Glucose, Bld: 154 mg/dL — ABNORMAL HIGH (ref 70–99)
Potassium: 5.9 mmol/L — ABNORMAL HIGH (ref 3.5–5.1)
Sodium: 138 mmol/L (ref 135–145)

## 2018-08-28 LAB — CBC
HEMATOCRIT: 32.3 % — AB (ref 39.0–52.0)
HEMATOCRIT: 35.7 % — AB (ref 39.0–52.0)
HEMOGLOBIN: 11.4 g/dL — AB (ref 13.0–17.0)
Hemoglobin: 10.5 g/dL — ABNORMAL LOW (ref 13.0–17.0)
MCH: 29.7 pg (ref 26.0–34.0)
MCH: 29.3 pg (ref 26.0–34.0)
MCHC: 32.5 g/dL (ref 30.0–36.0)
MCHC: 31.9 g/dL (ref 30.0–36.0)
MCV: 91.5 fL (ref 78.0–100.0)
MCV: 91.8 fL (ref 78.0–100.0)
PLATELETS: 199 10*3/uL (ref 150–400)
Platelets: 183 10*3/uL (ref 150–400)
RBC: 3.53 MIL/uL — ABNORMAL LOW (ref 4.22–5.81)
RBC: 3.89 MIL/uL — AB (ref 4.22–5.81)
RDW: 14 % (ref 11.5–15.5)
RDW: 13.8 % (ref 11.5–15.5)
WBC: 7.3 10*3/uL (ref 4.0–10.5)
WBC: 5.8 10*3/uL (ref 4.0–10.5)

## 2018-08-28 LAB — COMPREHENSIVE METABOLIC PANEL
ALK PHOS: 98 U/L (ref 38–126)
ALT: 10 U/L (ref 0–44)
AST: 13 U/L — ABNORMAL LOW (ref 15–41)
Albumin: 3.5 g/dL (ref 3.5–5.0)
Anion gap: 21 — ABNORMAL HIGH (ref 5–15)
BUN: 92 mg/dL — AB (ref 6–20)
CALCIUM: 9.4 mg/dL (ref 8.9–10.3)
CO2: 16 mmol/L — AB (ref 22–32)
CREATININE: 19.14 mg/dL — AB (ref 0.61–1.24)
Chloride: 100 mmol/L (ref 98–111)
GFR calc non Af Amer: 2 mL/min — ABNORMAL LOW (ref >60)
GFR, EST AFRICAN AMERICAN: 3 mL/min — AB (ref >60)
Glucose, Bld: 212 mg/dL — ABNORMAL HIGH (ref 70–99)
Potassium: 6.3 mmol/L (ref 3.5–5.1)
SODIUM: 137 mmol/L (ref 135–145)
Total Bilirubin: 1 mg/dL (ref 0.3–1.2)
Total Protein: 7.5 g/dL (ref 6.5–8.1)

## 2018-08-28 LAB — TROPONIN I
Troponin I: 0.09 ng/mL (ref <0.03)
Troponin I: 0.08 ng/mL (ref <0.03)
Troponin I: 0.1 ng/mL (ref <0.03)

## 2018-08-28 LAB — RENAL FUNCTION PANEL
ALBUMIN: 3.6 g/dL (ref 3.5–5.0)
ANION GAP: 14 (ref 5–15)
BUN: 56 mg/dL — ABNORMAL HIGH (ref 6–20)
CALCIUM: 8.5 mg/dL — AB (ref 8.9–10.3)
CO2: 22 mmol/L (ref 22–32)
CREATININE: 13.26 mg/dL — AB (ref 0.61–1.24)
Chloride: 101 mmol/L (ref 98–111)
GFR, EST AFRICAN AMERICAN: 4 mL/min — AB (ref >60)
GFR, EST NON AFRICAN AMERICAN: 4 mL/min — AB (ref >60)
Glucose, Bld: 204 mg/dL — ABNORMAL HIGH (ref 70–99)
PHOSPHORUS: 6.7 mg/dL — AB (ref 2.5–4.6)
Potassium: 4.9 mmol/L (ref 3.5–5.1)
SODIUM: 137 mmol/L (ref 135–145)

## 2018-08-28 LAB — MRSA PCR SCREENING: MRSA by PCR: NEGATIVE

## 2018-08-28 LAB — GLUCOSE, CAPILLARY
GLUCOSE-CAPILLARY: 165 mg/dL — AB (ref 70–99)
GLUCOSE-CAPILLARY: 221 mg/dL — AB (ref 70–99)
Glucose-Capillary: 255 mg/dL — ABNORMAL HIGH (ref 70–99)

## 2018-08-28 LAB — CBG MONITORING, ED: Glucose-Capillary: 212 mg/dL — ABNORMAL HIGH (ref 70–99)

## 2018-08-28 MED ORDER — AMLODIPINE BESYLATE 10 MG PO TABS
10.0000 mg | ORAL_TABLET | Freq: Every day | ORAL | Status: DC
  Administered 2018-08-28 – 2018-08-29 (×2): 10 mg via ORAL
  Filled 2018-08-28 (×2): qty 1

## 2018-08-28 MED ORDER — LIDOCAINE HCL (PF) 1 % IJ SOLN: 5.0000 mL | INTRAMUSCULAR | Status: DC | PRN

## 2018-08-28 MED ORDER — LEVOTHYROXINE SODIUM 25 MCG PO TABS
25.0000 ug | ORAL_TABLET | Freq: Every day | ORAL | Status: DC
  Administered 2018-08-28 – 2018-08-29 (×2): 25 ug via ORAL
  Filled 2018-08-28 (×2): qty 1

## 2018-08-28 MED ORDER — ALTEPLASE 2 MG IJ SOLR
INTRAMUSCULAR | Status: AC
  Administered 2018-08-28: 10:00:00
  Filled 2018-08-28: qty 2

## 2018-08-28 MED ORDER — POLYETHYLENE GLYCOL 3350 17 G PO PACK
17.0000 g | PACK | Freq: Every day | ORAL | Status: DC
  Filled 2018-08-28: qty 1

## 2018-08-28 MED ORDER — METOPROLOL TARTRATE 25 MG PO TABS
100.0000 mg | ORAL_TABLET | Freq: Two times a day (BID) | ORAL | Status: DC
  Administered 2018-08-28 – 2018-08-29 (×3): 100 mg via ORAL
  Filled 2018-08-28 (×3): qty 4

## 2018-08-28 MED ORDER — ATORVASTATIN CALCIUM 80 MG PO TABS
80.0000 mg | ORAL_TABLET | Freq: Every day | ORAL | Status: DC
  Administered 2018-08-28 – 2018-08-29 (×2): 80 mg via ORAL
  Filled 2018-08-28 (×2): qty 1

## 2018-08-28 MED ORDER — HEPARIN SODIUM (PORCINE) 5000 UNIT/ML IJ SOLN
5000.0000 [IU] | Freq: Three times a day (TID) | INTRAMUSCULAR | Status: DC
  Administered 2018-08-28 – 2018-08-29 (×4): 5000 [IU] via SUBCUTANEOUS
  Filled 2018-08-28 (×6): qty 1

## 2018-08-28 MED ORDER — HYDRALAZINE HCL 20 MG/ML IJ SOLN: 5.0000 mg | INTRAMUSCULAR | Status: DC | PRN

## 2018-08-28 MED ORDER — HYDRALAZINE HCL 25 MG PO TABS
ORAL_TABLET | ORAL | Status: AC
  Filled 2018-08-28: qty 2

## 2018-08-28 MED ORDER — HYDRALAZINE HCL 50 MG PO TABS
50.0000 mg | ORAL_TABLET | Freq: Three times a day (TID) | ORAL | Status: DC
  Administered 2018-08-28 – 2018-08-29 (×4): 50 mg via ORAL
  Filled 2018-08-28 (×7): qty 1

## 2018-08-28 MED ORDER — SODIUM CHLORIDE 0.9 % IV SOLN: 100.0000 mL | INTRAVENOUS | Status: DC | PRN

## 2018-08-28 MED ORDER — HEPARIN SODIUM (PORCINE) 1000 UNIT/ML DIALYSIS
1000.0000 [IU] | INTRAMUSCULAR | Status: DC | PRN
  Filled 2018-08-28: qty 1

## 2018-08-28 MED ORDER — HEPARIN SODIUM (PORCINE) 1000 UNIT/ML DIALYSIS
20.0000 [IU]/kg | INTRAMUSCULAR | Status: DC | PRN
  Filled 2018-08-28: qty 3

## 2018-08-28 MED ORDER — ALTEPLASE 2 MG IJ SOLR: 2.0000 mg | Freq: Once | INTRAMUSCULAR | Status: DC | PRN

## 2018-08-28 MED ORDER — PANTOPRAZOLE SODIUM 40 MG PO TBEC
40.0000 mg | DELAYED_RELEASE_TABLET | Freq: Every day | ORAL | Status: DC
  Administered 2018-08-29: 40 mg via ORAL
  Filled 2018-08-28 (×3): qty 1

## 2018-08-28 MED ORDER — PENTAFLUOROPROP-TETRAFLUOROETH EX AERO: 1.0000 "application " | INHALATION_SPRAY | CUTANEOUS | Status: DC | PRN

## 2018-08-28 MED ORDER — HYDRALAZINE HCL 25 MG PO TABS
50.0000 mg | ORAL_TABLET | Freq: Once | ORAL | Status: AC
  Administered 2018-08-28: 50 mg via ORAL

## 2018-08-28 MED ORDER — ACETAMINOPHEN 325 MG PO TABS: 650.0000 mg | ORAL_TABLET | Freq: Four times a day (QID) | ORAL | Status: DC | PRN

## 2018-08-28 MED ORDER — SEVELAMER CARBONATE 800 MG PO TABS
1600.0000 mg | ORAL_TABLET | Freq: Three times a day (TID) | ORAL | Status: DC
  Administered 2018-08-28 – 2018-08-29 (×5): 1600 mg via ORAL
  Filled 2018-08-28 (×6): qty 2

## 2018-08-28 MED ORDER — ACETAMINOPHEN 650 MG RE SUPP: 650.0000 mg | Freq: Four times a day (QID) | RECTAL | Status: DC | PRN

## 2018-08-28 MED ORDER — SENNA 8.6 MG PO TABS
2.0000 | ORAL_TABLET | Freq: Every day | ORAL | Status: DC
  Filled 2018-08-28: qty 2

## 2018-08-28 MED ORDER — LIDOCAINE 5 % EX PTCH
2.0000 | MEDICATED_PATCH | CUTANEOUS | Status: DC
  Filled 2018-08-28: qty 2

## 2018-08-28 MED ORDER — PATIROMER SORBITEX CALCIUM 8.4 G PO PACK
16.8000 g | PACK | ORAL | Status: DC
  Filled 2018-08-28: qty 2

## 2018-08-28 MED ORDER — ONDANSETRON HCL 4 MG PO TABS: 4.0000 mg | ORAL_TABLET | Freq: Four times a day (QID) | ORAL | Status: DC | PRN

## 2018-08-28 MED ORDER — CHLORHEXIDINE GLUCONATE CLOTH 2 % EX PADS
6.0000 | MEDICATED_PAD | Freq: Every day | CUTANEOUS | Status: DC
  Administered 2018-08-28 – 2018-08-29 (×2): 6 via TOPICAL

## 2018-08-28 MED ORDER — ONDANSETRON HCL 4 MG/2ML IJ SOLN
4.0000 mg | Freq: Four times a day (QID) | INTRAMUSCULAR | Status: DC | PRN
  Administered 2018-08-29: 4 mg via INTRAVENOUS
  Filled 2018-08-28: qty 2

## 2018-08-28 MED ORDER — GABAPENTIN 300 MG PO CAPS
300.0000 mg | ORAL_CAPSULE | Freq: Every day | ORAL | Status: DC
  Administered 2018-08-28 – 2018-08-29 (×2): 300 mg via ORAL
  Filled 2018-08-28 (×2): qty 1

## 2018-08-28 MED ORDER — INSULIN ASPART 100 UNIT/ML ~~LOC~~ SOLN
0.0000 [IU] | Freq: Three times a day (TID) | SUBCUTANEOUS | Status: DC
  Administered 2018-08-28: 2 [IU] via SUBCUTANEOUS
  Administered 2018-08-28: 3 [IU] via SUBCUTANEOUS
  Administered 2018-08-28: 5 [IU] via SUBCUTANEOUS
  Administered 2018-08-29 (×2): 3 [IU] via SUBCUTANEOUS
  Filled 2018-08-28: qty 1

## 2018-08-28 MED ORDER — LIDOCAINE-PRILOCAINE 2.5-2.5 % EX CREA
1.0000 "application " | TOPICAL_CREAM | CUTANEOUS | Status: DC | PRN
  Filled 2018-08-28: qty 5

## 2018-08-28 MED ORDER — CINACALCET HCL 30 MG PO TABS
60.0000 mg | ORAL_TABLET | Freq: Every day | ORAL | Status: DC
  Administered 2018-08-28 – 2018-08-29 (×2): 60 mg via ORAL
  Filled 2018-08-28 (×2): qty 2

## 2018-08-28 NOTE — H&P (Signed)
History and Physical    David Walsh MEQ:[id number] DOB: 07/19/1966 DOA: 08/27/2018  PCP: [facility]  Patient coming from: Home.  Chief Complaint: Shortness of breath.  HPI: David Walsh is a 52 y.o. male with history of ESRD on hemodialysis on Tuesday Thursday Saturday who has missed his dialysis for last 3 sessions due to difficulty accessing is dialysis catheter and patient states it looks like it is prolonged presents to the ER because of shortness of breath.  Has been having progressive shortness of breath last 1 week denies any chest pain fever chills productive cough nausea vomiting abdominal pain or diarrhea.  Has been taking his medicines as advised.  Patient was admitted last month for bacteremia and upper back abscess and at that time patient's catheter was changed.  ED Course: In the ER patient blood pressure is more than 297 systolic chest x-ray shows congestion.  Patient finds it difficult to lie flat due to shortness of breath.  But is not hypoxic.  Patient admitted for acute pulmonary edema and hypertensive urgency.  Review of Systems: As per HPI, rest all negative.   Past Medical History:  Diagnosis Date  . Anxiety   . Arthritis   . Diabetes mellitus without complication ([place])   . Hx of BKA ([place])   . Neuropathy   . Renal disorder     Past Surgical History:  Procedure Laterality Date  . BELOW KNEE LEG AMPUTATION  2014   Right.  Due to non-healing foot ulcer.  . IR FLUORO GUIDE CV LINE LEFT  07/19/2018  . IR REMOVAL TUN CV CATH W/O FL  07/18/2018  . IR US GUIDE VASC ACCESS LEFT  07/19/2018  . REPLACEMENT TOTAL KNEE Left      reports that he has been smoking cigars. He has never used smokeless tobacco. He reports that he drank alcohol. He reports that he has current or past drug history.  Allergies  Allergen Reactions  . Shellfish Allergy Anaphylaxis  . Penicillins Itching and Rash    Has patient had a PCN reaction causing immediate rash,  facial/tongue/throat swelling, SOB or lightheadedness with hypotension: Yes Has patient had a PCN reaction causing severe rash involving mucus membranes or skin necrosis: No Has patient had a PCN reaction that required hospitalization: No Has patient had a PCN reaction occurring within the last 10 years: No If all of the above answers are "NO", then may proceed with Cephalosporin use.     Family History  Problem Relation Age of Onset  . Arthritis Mother   . Heart attack Father   . Hypertension Sister     Prior to Admission medications   Medication Sig Start Date End Date Taking? Authorizing Provider  amLODipine (NORVASC) 10 MG tablet Take 10 mg by mouth at bedtime.    [provider]  atorvastatin (LIPITOR) 80 MG tablet Take 80 mg by mouth at bedtime.    [provider]  cinacalcet (SENSIPAR) 60 MG tablet Take 60 mg by mouth daily.    [provider]  gabapentin (NEURONTIN) 600 MG tablet Take 0.5 tablets (300 mg total) by mouth at bedtime. 07/23/18   Patrecia Pour, MD  hydrALAZINE (APRESOLINE) 50 MG tablet Take 50 mg by mouth every 8 (eight) hours.    [provider]  HYDROcodone-acetaminophen (NORCO/VICODIN) 5-325 MG tablet Take 1-2 tablets by mouth every 6 (six) hours as needed for moderate pain or severe pain. 07/23/18   Patrecia Pour, MD  insulin aspart (NOVOLOG)  100 UNIT/ML injection Inject 5 Units into the skin 3 (three) times daily before meals.    [provider]  levothyroxine (SYNTHROID, LEVOTHROID) 25 MCG tablet Take 25 mcg by mouth daily.    [provider]  lidocaine (LIDODERM) 5 % Place 2 patches onto the skin daily. Remove & Discard patch within 12 hours or as directed by MD 07/24/18   Patrecia Pour, MD  metoprolol tartrate (LOPRESSOR) 100 MG tablet Take 100 mg by mouth 2 (two) times daily.    [provider]  mupirocin ointment (BACTROBAN) 2 % Apply 1 application topically 2 (two) times daily. 08/02/18   Tasia Catchings, Amy V,  PA-C  Nutritional Supplements (ENSURE CLEAR) LIQD Take 1 Can by mouth as needed.     [provider]  Nutritional Supplements (NUTRITIONAL SUPPLEMENT PO) Take 1 Scoop by mouth 2 (two) times daily as needed. Propass Powder    [provider]  oxyCODONE (ROXICODONE) 5 MG immediate release tablet Take 1 tablet (5 mg total) by mouth every 4 (four) hours as needed for severe pain. 07/31/18   Fawze, Mina A, PA-C  pantoprazole (PROTONIX) 40 MG tablet Take 1 tablet (40 mg total) by mouth daily at 6 (six) AM. 07/24/18   Patrecia Pour, MD  polyethylene glycol (MIRALAX / GLYCOLAX) packet Take 17 g by mouth daily. 07/24/18   Patrecia Pour, MD  senna (SENOKOT) 8.6 MG TABS tablet Take 2 tablets (17.2 mg total) by mouth at bedtime. 07/23/18   Patrecia Pour, MD  sevelamer carbonate (RENVELA) 800 MG tablet Take 1,600 mg by mouth 3 (three) times daily before meals.    [provider]  sildenafil (VIAGRA) 100 MG tablet Take 100 mg by mouth as needed for erectile dysfunction (take one hour prior to activity).    [provider]    Physical Exam: Vitals:   08/28/18 0400 08/28/18 0415 08/28/18 0445 08/28/18 0500  BP: (!) 201/117 (!) 209/111 (!) 212/172 (!) 192/88  Pulse: (!) 108 (!) 107 (!) 108 (!) 109  Resp: 20 18 (!) 24 19  Temp:      TempSrc:      SpO2: 94% 94% 93% 99%      Constitutional: Moderately built and nourished. Vitals:   08/28/18 0400 08/28/18 0415 08/28/18 0445 08/28/18 0500  BP: (!) 201/117 (!) 209/111 (!) 212/172 (!) 192/88  Pulse: (!) 108 (!) 107 (!) 108 (!) 109  Resp: 20 18 (!) 24 19  Temp:      TempSrc:      SpO2: 94% 94% 93% 99%   Eyes: Anicteric no pallor. ENMT: No discharge from the ears eyes nose or mouth. Neck: No mass felt.  JVD elevated. Respiratory: No rhonchi or crepitations. Cardiovascular: S1-S2 heard no murmurs appreciated. Abdomen: Soft nontender bowel sounds present. Musculoskeletal: No edema.  No joint effusion.  Right BKA. Skin: No  rash. Neurologic: Alert awake oriented to time place and person.  Moves all extremities. Psychiatric: Appears normal.   Labs on Admission: I have personally reviewed following labs and imaging studies  CBC: Recent Labs  Lab 08/25/18 1750 08/25/18 1819 08/27/18 2251  WBC 7.4  --  7.3  HGB 10.9* 10.9* 10.5*  HCT 34.3* 32.0* 32.3*  MCV 91.0  --  91.5  PLT 191  --  824   Basic Metabolic Panel: Recent Labs  Lab 08/25/18 1819 08/27/18 2251  NA 135 138  K 5.0 5.9*  CL 100 98  CO2  --  19*  GLUCOSE 87 154*  BUN 78* 90*  CREATININE 16.60* 18.35*  CALCIUM  --  9.4   GFR: CrCl cannot be calculated (Unknown ideal weight.). Liver Function Tests: No results for input(s): AST, ALT, ALKPHOS, BILITOT, PROT, ALBUMIN in the last 168 hours. No results for input(s): LIPASE, AMYLASE in the last 168 hours. No results for input(s): AMMONIA in the last 168 hours. Coagulation Profile: No results for input(s): INR, PROTIME in the last 168 hours. Cardiac Enzymes: No results for input(s): CKTOTAL, CKMB, CKMBINDEX, TROPONINI in the last 168 hours. BNP (last 3 results) No results for input(s): PROBNP in the last 8760 hours. HbA1C: No results for input(s): HGBA1C in the last 72 hours. CBG: Recent Labs  Lab 08/26/18 1107  GLUCAP 54*   Lipid Profile: No results for input(s): CHOL, HDL, LDLCALC, TRIG, CHOLHDL, LDLDIRECT in the last 72 hours. Thyroid Function Tests: No results for input(s): TSH, T4TOTAL, FREET4, T3FREE, THYROIDAB in the last 72 hours. Anemia Panel: No results for input(s): VITAMINB12, FOLATE, FERRITIN, TIBC, IRON, RETICCTPCT in the last 72 hours. Urine analysis:    Component Value Date/Time   COLORURINE YELLOW 12/15/2010 2309   APPEARANCEUR CLEAR 12/15/2010 2309   LABSPEC 1.021 12/15/2010 2309   PHURINE 6.5 12/15/2010 2309   GLUCOSEU 500 (A) 12/15/2010 2309   HGBUR LARGE (A) 12/15/2010 2309   BILIRUBINUR NEGATIVE 12/15/2010 2309   KETONESUR NEGATIVE 12/15/2010 2309     PROTEINUR >300 (A) 12/15/2010 2309   UROBILINOGEN 0.2 12/15/2010 2309   NITRITE NEGATIVE 12/15/2010 2309   LEUKOCYTESUR NEGATIVE 12/15/2010 2309   Sepsis Labs: @LABRCNTIP (procalcitonin:4,lacticidven:4) )No results found for this or any previous visit (from the past 240 hour(s)).   Radiological Exams on Admission: Dg Chest 2 View  Result Date: 08/27/2018 CLINICAL DATA:  Shortness of breath EXAM: CHEST - 2 VIEW COMPARISON:  07/29/2018 FINDINGS: Left-sided central venous catheter tips over the SVC. Low lung volumes with basilar atelectasis. Enlarged cardiomediastinal silhouette with mild central congestion. No pneumothorax. IMPRESSION: 1. Low lung volumes with basilar atelectasis 2. Cardiomegaly with vascular congestion Electronically Signed   By: Donavan Foil M.D.   On: 08/27/2018 23:30    EKG: Independently reviewed.  Normal sinus rhythm with inferolateral T wave changes.  Assessment/Plan Principal Problem:   Acute pulmonary edema (HCC) Active Problems:   Type 2 diabetes mellitus with renal manifestations (HCC)   ESRD (end stage renal disease) (HCC)   Hx of BKA, right ([place])   Hypertensive urgency   Hypothyroidism    1. Acute pulmonary edema likely from missing dialysis with known history of ESRD and also has hypertensive urgency -discussed with nephrologist about dialysis.  Patient also has issues with his catheter.  Will admit to stepdown. 2. Hypertensive urgency likely from missing dialysis.  Will continue patient on his home medications amlodipine metoprolol and hydralazine and keep patient on PRN IV hydralazine.  I think patient's blood pressure will improve with dialysis. 3. Diabetes mellitus type 2 -on sliding scale coverage. 4. Anemia likely from ESRD follow CBC. 5. Hypothyroidism on Synthroid. 6. Right BKA. 7. Morbid obesity.   DVT prophylaxis: Heparin. Code Status: Full code. Family Communication: Discussed with patient's wife. Disposition Plan: Home. Consults  called: Nephrology. Admission status: Inpatient.   Rise Patience MD [facility] Pager [phone].  If 7PM-7AM, please contact night-coverage [email or web address] Password [facility]  08/28/2018, 6:27 AM

## 2018-08-28 NOTE — ED Notes (Signed)
IV team in to declot perm-a-cath to left anterior chest wall

## 2018-08-28 NOTE — ED Notes (Signed)
Report given to 2 [place] RN - ready to accept pt

## 2018-08-28 NOTE — ED Notes (Signed)
Attempted to call report - secretary requests that I call back in 5 minutes to give report as floor RN has not been made aware that she was getting a pt

## 2018-08-28 NOTE — Progress Notes (Signed)
08/28/2018 Patient transfer from the emergency room to 2W11  At 1000. He is alert, oriented. Patient has BKA and use a prosthetic leg. Left lower leg and feet is swelling, skin is dry and flaky. Patient have a raise are on right upper shoulder. Patient right stump was assess and skin is intact. Patient receive CHG both and MRSA swab. Patient was place on telemetry. [facility] RN.

## 2018-08-28 NOTE — ED Provider Notes (Signed)
[facility] EMERGENCY DEPARTMENT Provider Note   CSN: [id number] Arrival date & time: 08/27/18  2223     History   Chief Complaint Chief Complaint  Patient presents with  . Shortness of Breath    HPI David Walsh is a 52 y.o. male.  Patient with past medical history remarkable for ESRD on HD (Tuesday, Thursday, Saturday), last dialyzed last Thursday, presents to the emergency department with a chief complaint of shortness of breath.  He reports that he has been unable to get dialysis due to his port being clogged.  He reports that the shortness of breath is worsening.  States that he cannot be very active, and cannot lie down.  He denies any fever, chills, nausea, or vomiting.  Denies any pain.  Denies any other associated symptoms.  The history is provided by the patient. No language interpreter was used.    Past Medical History:  Diagnosis Date  . Anxiety   . Arthritis   . Diabetes mellitus without complication ([place])   . Hx of BKA ([place])   . Neuropathy   . Renal disorder     Patient Active Problem List   Diagnosis Date Noted  . Acute pulmonary edema ([place]) 08/28/2018  . Bacteremia associated with intravascular line ([place])   . Abscess 07/15/2018  . DM (diabetes mellitus) ([place]) 07/15/2018  . HTN (hypertension) 07/15/2018  . ESRD (end stage renal disease) ([place]) 07/15/2018  . Abdominal pain 07/15/2018  . Hx of BKA, right ([place]) 07/15/2018    Past Surgical History:  Procedure Laterality Date  . BELOW KNEE LEG AMPUTATION  2014   Right.  Due to non-healing foot ulcer.  . IR FLUORO GUIDE CV LINE LEFT  07/19/2018  . IR REMOVAL TUN CV CATH W/O FL  07/18/2018  . IR US GUIDE VASC ACCESS LEFT  07/19/2018  . REPLACEMENT TOTAL KNEE Left         Home Medications    Prior to Admission medications   Medication Sig Start Date End Date Taking? Authorizing Provider  amLODipine (NORVASC) 10 MG tablet Take 10 mg by mouth at bedtime.    [provider]    atorvastatin (LIPITOR) 80 MG tablet Take 80 mg by mouth at bedtime.    [provider]  cinacalcet (SENSIPAR) 60 MG tablet Take 60 mg by mouth daily.    [provider]  gabapentin (NEURONTIN) 600 MG tablet Take 0.5 tablets (300 mg total) by mouth at bedtime. 07/23/18   Patrecia Pour, MD  hydrALAZINE (APRESOLINE) 50 MG tablet Take 50 mg by mouth every 8 (eight) hours.    [provider]  HYDROcodone-acetaminophen (NORCO/VICODIN) 5-325 MG tablet Take 1-2 tablets by mouth every 6 (six) hours as needed for moderate pain or severe pain. 07/23/18   Patrecia Pour, MD  insulin aspart (NOVOLOG) 100 UNIT/ML injection Inject 5 Units into the skin 3 (three) times daily before meals.    [provider]  levothyroxine (SYNTHROID, LEVOTHROID) 25 MCG tablet Take 25 mcg by mouth daily.    [provider]  lidocaine (LIDODERM) 5 % Place 2 patches onto the skin daily. Remove & Discard patch within 12 hours or as directed by MD 07/24/18   Patrecia Pour, MD  metoprolol tartrate (LOPRESSOR) 100 MG tablet Take 100 mg by mouth 2 (two) times daily.    [provider]  mupirocin ointment (BACTROBAN) 2 % Apply 1 application topically 2 (two) times daily. 08/02/18   Ok Edwards,  PA-C  Nutritional Supplements (ENSURE CLEAR) LIQD Take 1 Can by mouth as needed.     [provider]  Nutritional Supplements (NUTRITIONAL SUPPLEMENT PO) Take 1 Scoop by mouth 2 (two) times daily as needed. Propass Powder    [provider]  oxyCODONE (ROXICODONE) 5 MG immediate release tablet Take 1 tablet (5 mg total) by mouth every 4 (four) hours as needed for severe pain. 07/31/18   Fawze, Mina A, PA-C  pantoprazole (PROTONIX) 40 MG tablet Take 1 tablet (40 mg total) by mouth daily at 6 (six) AM. 07/24/18   Patrecia Pour, MD  polyethylene glycol (MIRALAX / GLYCOLAX) packet Take 17 g by mouth daily. 07/24/18   Patrecia Pour, MD  senna (SENOKOT) 8.6 MG TABS tablet Take 2 tablets (17.2 mg  total) by mouth at bedtime. 07/23/18   Patrecia Pour, MD  sevelamer carbonate (RENVELA) 800 MG tablet Take 1,600 mg by mouth 3 (three) times daily before meals.    [provider]  sildenafil (VIAGRA) 100 MG tablet Take 100 mg by mouth as needed for erectile dysfunction (take one hour prior to activity).    [provider]    Family History Family History  Problem Relation Age of Onset  . Arthritis Mother   . Heart attack Father   . Hypertension Sister     Social History Social History   Tobacco Use  . Smoking status: Current Some Day Smoker    Types: Cigars  . Smokeless tobacco: Never Used  Substance Use Topics  . Alcohol use: Not Currently  . Drug use: Not Currently     Allergies   Shellfish allergy and Penicillins   Review of Systems Review of Systems  All other systems reviewed and are negative.    Physical Exam Updated Vital Signs BP (!) 235/109 (BP Location: Left Arm)   Pulse (!) 109   Temp 98.6 F (37 C) (Oral)   Resp 20   SpO2 96%   Physical Exam  Constitutional: He is oriented to person, place, and time. He appears well-developed and well-nourished.  HENT:  Head: Normocephalic and atraumatic.  Eyes: Pupils are equal, round, and reactive to light. Conjunctivae and EOM are normal. Right eye exhibits no discharge. Left eye exhibits no discharge. No scleral icterus.  Neck: Normal range of motion. Neck supple. No JVD present.  Cardiovascular: Normal rate, regular rhythm and normal heart sounds. Exam reveals no gallop and no friction rub.  No murmur heard. Pulmonary/Chest: Effort normal and breath sounds normal. No respiratory distress. He has no wheezes. He has no rales. He exhibits no tenderness.  Abdominal: Soft. He exhibits no distension and no mass. There is no tenderness. There is no rebound and no guarding.  Musculoskeletal: Normal range of motion. He exhibits no edema or tenderness.  Right BKA  Neurological: He is alert and oriented  to person, place, and time.  Skin: Skin is warm and dry.  Psychiatric: He has a normal mood and affect. His behavior is normal. Judgment and thought content normal.  Nursing note and vitals reviewed.    ED Treatments / Results  Labs (all labs ordered are listed, but only abnormal results are displayed) Labs Reviewed  CBC - Abnormal; Notable for the following components:      Result Value   RBC 3.53 (*)    Hemoglobin 10.5 (*)    HCT 32.3 (*)    All other components within normal limits  BASIC METABOLIC PANEL - Abnormal; Notable for  the following components:   Potassium 5.9 (*)    CO2 19 (*)    Glucose, Bld 154 (*)    BUN 90 (*)    Creatinine, Ser 18.35 (*)    GFR calc non Af Amer 3 (*)    GFR calc Af Amer 3 (*)    Anion gap 21 (*)    All other components within normal limits    EKG None  Radiology Dg Chest 2 View  Result Date: 08/27/2018 CLINICAL DATA:  Shortness of breath EXAM: CHEST - 2 VIEW COMPARISON:  07/29/2018 FINDINGS: Left-sided central venous catheter tips over the SVC. Low lung volumes with basilar atelectasis. Enlarged cardiomediastinal silhouette with mild central congestion. No pneumothorax. IMPRESSION: 1. Low lung volumes with basilar atelectasis 2. Cardiomegaly with vascular congestion Electronically Signed   By: Donavan Foil M.D.   On: 08/27/2018 23:30    Procedures Procedures (including critical care time)  Medications Ordered in ED Medications  hydrALAZINE (APRESOLINE) 25 MG tablet (has no administration in time range)     Initial Impression / Assessment and Plan / ED Course  I have reviewed the triage vital signs and the nursing notes.  Pertinent labs & imaging results that were available during my care of the patient were reviewed by me and considered in my medical decision making (see chart for details).    Patient who is missed dialysis for 1 week.  Presents with shortness of breath x3 days.  K is 5.9.  Chest x-ray shows vascular congestion.   Patient is hypertensive to 235/109.  Will give hydralazine.  Will admit for dialysis.  Appreciate Dr. Hal Hope for admitting the patient.  Final Clinical Impressions(s) / ED Diagnoses   Final diagnoses:  Acute pulmonary edema [facility])    ED Discharge Orders    None       Montine Circle, PA-C 08/28/18 0923    Orpah Greek, MD 08/28/18 [phone]

## 2018-08-28 NOTE — Procedures (Signed)
I was present at this dialysis session, have reviewed the session itself and made  appropriate changes.  Despite tPA catheter still running BFR only 27mL/min and still with venous alarms.  Paged hospitalist to recommend [facility] exchange prior to discharge.   Jannifer Hick MD  [facility] pager [phone]   08/28/2018, 3:33 PM

## 2018-08-28 NOTE — Progress Notes (Signed)
Patient seen and examined, admitted by Dr. Hal Hope this morning  52 year old male with hypertension, type 2 diabetes, right BKA, ESRD TTS at [facility] presented with volume overload, shortness of breath, uncontrolled hypertension with BP in 200s.  Patient reports that he had missed his dialysis for a week due to problem with his dialysis catheter and was advised to go to the ER.  BP (!) 202/105 (BP Location: Left Arm)   Pulse (!) 111   Temp 98.8 F (37.1 C) (Oral)   Resp 17   SpO2 99%   A/p H&P reviewed 1.  ESRD on HD, TTS, missed for 1 week presenting with volume overload, hyperkalemia, hypertension -Nephrology consulted, plan for TPA and access for HD today  2.  Hypertension Plan for HD today, hopefully will improve his BP Placed on hydralazine IV as needed with parameters  3.  Chronic anemia due to ESRD H&H currently stable  4.  Elevated troponin likely due to #1, Currently no chest pain. Echo in 07/2018 showed EF of 60 to 65% with no regional wall motion abnormalities, grade 1 diastolic dysfunction    Onofrio Klemp M.D. [facility] 08/28/2018, 11:21 AM  Pager: [phone]

## 2018-08-28 NOTE — ED Notes (Signed)
Report taken from off going RN - care assumed at this time; sitting upright on stretcher on cell phone - aware of admission 0 currently awaiting bed assignment - pt aware as well;p states wife is bringing AM meds - requested that he not take his own meds without staff knowledge for safety reasons - additionally informed pt that pharmacy needed to verify meds if he chose to take his own - pt verbalized understanding

## 2018-08-28 NOTE — ED Notes (Signed)
Renal diet ordered - spoke with Hilda Blades

## 2018-08-28 NOTE — Consult Note (Addendum)
Reason for Consult: To manage dialysis and dialysis related needs Referring Physician:Dr. Rai   HPI:  David Walsh is an 52 y.o. male.  With history of hypertension, type 2 diabetes, right BKA, ESRD TTS at [place], presented with shortness of breath and problem with dialysis catheter.  Patient reported that the last dialysis was on Thursday (a week ago), on Saturday there is a problem with dialysis catheter therefore patient was advised to go to ER.  Patient apparently waited at home with gradual worsening shortness of breath. He was admitted last month for bacteremia when right IJ was removed and changed to left tunnel catheter.  He completed antibiotics.  Patient reported having intermittent problem with catheter recently. He has shortness of breath however no chest pain or hypoxia.  Denied nausea vomiting.  He did not have AV fistula because he is in the process of getting transplant per patient.  ESRD for 7 years due to diabetes and hypertension.  Patient with hypertensive urgency, potassium 5.9.  Dialysis Orders:TTS [facility] [phone] 4.5h 279lb/ 127.5kg, left IJ  Past Medical History:  Diagnosis Date  . Anxiety   . Arthritis   . Diabetes mellitus without complication ([place])   . Hx of BKA ([place])   . Neuropathy   . Renal disorder     Past Surgical History:  Procedure Laterality Date  . BELOW KNEE LEG AMPUTATION  2014   Right.  Due to non-healing foot ulcer.  . IR FLUORO GUIDE CV LINE LEFT  07/19/2018  . IR REMOVAL TUN CV CATH W/O FL  07/18/2018  . IR US GUIDE VASC ACCESS LEFT  07/19/2018  . REPLACEMENT TOTAL KNEE Left     Family History  Problem Relation Age of Onset  . Arthritis Mother   . Heart attack Father   . Hypertension Sister     Social History:  reports that he has been smoking cigars. He has never used smokeless tobacco. He reports that he drank alcohol. He reports that he has current or past drug history.  Allergies:  Allergies  Allergen Reactions  .  Shellfish Allergy Anaphylaxis  . Penicillins Itching and Rash    Has patient had a PCN reaction causing immediate rash, facial/tongue/throat swelling, SOB or lightheadedness with hypotension: Yes Has patient had a PCN reaction causing severe rash involving mucus membranes or skin necrosis: No Has patient had a PCN reaction that required hospitalization: No Has patient had a PCN reaction occurring within the last 10 years: No If all of the above answers are "NO", then may proceed with Cephalosporin use.     Medications: I have reviewed the patient's current medications.   Results for orders placed or performed during the hospital encounter of 08/27/18 (from the past 48 hour(s))  CBC     Status: Abnormal   Collection Time: 08/27/18 10:51 PM  Result Value Ref Range   WBC 7.3 4.0 - 10.5 K/uL   RBC 3.53 (L) 4.22 - 5.81 MIL/uL   Hemoglobin 10.5 (L) 13.0 - 17.0 g/dL   HCT 32.3 (L) 39.0 - 52.0 %   MCV 91.5 78.0 - 100.0 fL   MCH 29.7 26.0 - 34.0 pg   MCHC 32.5 30.0 - 36.0 g/dL   RDW 14.0 11.5 - 15.5 %   Platelets 199 150 - 400 K/uL    Comment: Performed at [facility], [place] [address] [place], [place] [address]  Basic metabolic panel     Status: Abnormal   Collection Time: 08/27/18 10:51 PM  Result Value Ref Range   Sodium 138 135 - 145 mmol/L   Potassium 5.9 (H) 3.5 - 5.1 mmol/L   Chloride 98 98 - 111 mmol/L   CO2 19 (L) 22 - 32 mmol/L   Glucose, Bld 154 (H) 70 - 99 mg/dL   BUN 90 (H) 6 - 20 mg/dL   Creatinine, Ser 18.35 (H) 0.61 - 1.24 mg/dL   Calcium 9.4 8.9 - 10.3 mg/dL   GFR calc non Af Amer 3 (L) >60 mL/min   GFR calc Af Amer 3 (L) >60 mL/min    Comment: (NOTE) The eGFR has been calculated using the CKD EPI equation. This calculation has not been validated in all clinical situations. eGFR's persistently <60 mL/min signify possible Chronic Kidney Disease.    Anion gap 21 (H) 5 - 15    Comment: Performed at [facility], [place] [address] [place], [place]  [address]  CBC WITH DIFFERENTIAL     Status: Abnormal   Collection Time: 08/28/18  6:25 AM  Result Value Ref Range   WBC 7.6 4.0 - 10.5 K/uL   RBC 3.55 (L) 4.22 - 5.81 MIL/uL   Hemoglobin 10.3 (L) 13.0 - 17.0 g/dL   HCT 32.7 (L) 39.0 - 52.0 %   MCV 92.1 78.0 - 100.0 fL   MCH 29.0 26.0 - 34.0 pg   MCHC 31.5 30.0 - 36.0 g/dL   RDW 14.0 11.5 - 15.5 %   Platelets 186 150 - 400 K/uL   Neutrophils Relative % 69 %   Neutro Abs 5.2 1.7 - 7.7 K/uL   Lymphocytes Relative 18 %   Lymphs Abs 1.4 0.7 - 4.0 K/uL   Monocytes Relative 8 %   Monocytes Absolute 0.6 0.1 - 1.0 K/uL   Eosinophils Relative 4 %   Eosinophils Absolute 0.3 0.0 - 0.7 K/uL   Basophils Relative 1 %   Basophils Absolute 0.0 0.0 - 0.1 K/uL   Immature Granulocytes 0 %   Abs Immature Granulocytes 0.0 0.0 - 0.1 K/uL    Comment: Performed at [facility] [address], [place], [place] [address]  CBG monitoring, ED     Status: Abnormal   Collection Time: 08/28/18  7:42 AM  Result Value Ref Range   Glucose-Capillary 212 (H) 70 - 99 mg/dL    Dg Chest 2 View  Result Date: 08/27/2018 CLINICAL DATA:  Shortness of breath EXAM: CHEST - 2 VIEW COMPARISON:  07/29/2018 FINDINGS: Left-sided central venous catheter tips over the SVC. Low lung volumes with basilar atelectasis. Enlarged cardiomediastinal silhouette with mild central congestion. No pneumothorax. IMPRESSION: 1. Low lung volumes with basilar atelectasis 2. Cardiomegaly with vascular congestion Electronically Signed   By: Donavan Foil M.D.   On: 08/27/2018 23:30    ROS: all system reviewed, as HPI other negative. Blood pressure (!) 176/104, pulse (!) 108, temperature 98.6 F (37 C), temperature source Oral, resp. rate (!) 27, SpO2 99 %.  Assessment/Plan:  # SOB/volume overload due to missing dialysis twice. Plan for HD today. Not on oxygen currently.   # vascular access: Reportedly problem with blood flow in left IJ at the [place].  Plan for TPA and then access the catheter  for dialysis today.  If there is poor blood flow from the catheter then he may need new access.  Patient agreed with the plan.  I discussed with the dialysis nurse as well.  # ESRD: TTS at the [place].  Dialysis today, 2K, 4 kg UF.  May need HD  Tomorrow, will assess in am.   # Hyperkalemia: order a dose of V[place]assa.   #  Hypertension: Elevated blood pressure because of volume.  Dialysis today.  Resume home medication.  # Anemia of ESRD: Hemoglobin 10.3.   Dron Tanna Furry 08/28/2018, 8:33 AM

## 2018-08-28 NOTE — Progress Notes (Signed)
Inpatient Diabetes Program Recommendations  AACE/ADA: New Consensus Statement on Inpatient Glycemic Control (2015)  Target Ranges:  Prepandial:   less than 140 mg/dL      Peak postprandial:   less than 180 mg/dL (1-2 hours)      Critically ill patients:  140 - 180 mg/dL   Lab Results  Component Value Date   GLUCAP 255 (H) 08/28/2018   HGBA1C (H) 12/16/2010    7.7 (NOTE)                                                                       According to the ADA Clinical Practice Recommendations for 2011, when HbA1c is used as a screening test:   >=6.5%   Diagnostic of Diabetes Mellitus           (if abnormal result  is confirmed)  5.7-6.4%   Increased risk of developing Diabetes Mellitus  References:Diagnosis and Classification of Diabetes Mellitus,Diabetes HIDU,3735,78(XBOER 1):S62-S69 and Standards of Medical Care in         Diabetes - 2011,Diabetes Care,2011,34  (Suppl 1):S11-S61.    Review of Glycemic Control  Diabetes history: DM2 Outpatient Diabetes medications: Novolog 5 units tidwc Current orders for Inpatient glycemic control: Novolog 0-9 units tidwc  FBS > 180 mg/dL. May benefit from addition of basal insulin.  Inpatient Diabetes Program Recommendations:     Add Lantus 8 units QHS. Add Novolog 2 units tidwc if pt eats > 50% meal.  Continue to follow.  Thank you. Lorenda Peck, RD, LDN, CDE Inpatient Diabetes Coordinator [phone]

## 2018-08-29 ENCOUNTER — Encounter (HOSPITAL_COMMUNITY): Payer: Self-pay | Admitting: *Deleted

## 2018-08-29 DIAGNOSIS — J810 Acute pulmonary edema: Secondary | ICD-10-CM

## 2018-08-29 LAB — GLUCOSE, CAPILLARY
Glucose-Capillary: 215 mg/dL — ABNORMAL HIGH (ref 70–99)
Glucose-Capillary: 241 mg/dL — ABNORMAL HIGH (ref 70–99)
Glucose-Capillary: 162 mg/dL — ABNORMAL HIGH (ref 70–99)

## 2018-08-29 MED ORDER — INSULIN ASPART 100 UNIT/ML ~~LOC~~ SOLN
0.0000 [IU] | Freq: Three times a day (TID) | SUBCUTANEOUS | Status: DC
  Administered 2018-08-29 – 2018-08-30 (×2): 3 [IU] via SUBCUTANEOUS

## 2018-08-29 MED ORDER — CHLORHEXIDINE GLUCONATE CLOTH 2 % EX PADS: 6.0000 | MEDICATED_PAD | Freq: Every day | CUTANEOUS | Status: DC

## 2018-08-29 MED ORDER — LOPERAMIDE HCL 2 MG PO CAPS
2.0000 mg | ORAL_CAPSULE | ORAL | Status: DC | PRN
  Administered 2018-08-29 (×2): 2 mg via ORAL
  Filled 2018-08-29 (×2): qty 1

## 2018-08-29 MED ORDER — FAMOTIDINE 20 MG PO TABS
20.0000 mg | ORAL_TABLET | Freq: Every day | ORAL | Status: DC
  Administered 2018-08-29: 20 mg via ORAL
  Filled 2018-08-29: qty 1

## 2018-08-29 NOTE — Progress Notes (Signed)
[facility] Progress Note  Patient: David Walsh KDT:[id number]   [facility] DOB: 10-06-1966   DOA: 08/27/2018   DOS: 08/29/2018   Date of Service: the patient was seen and examined on 08/29/2018  Subjective: Feeling better.  No nausea no vomiting.  Some medication.  No diarrhea.  Blood pressure better overnight.  Brief hospital course: Pt. with PMH of ESRD on HD, HTN, type II DM; admitted on 08/27/2018, presented with complaint of shortness of breath, was found to have malfunctioning HD access with volume overload and hypertensive urgency. Currently further plan is [facility] arrangement prior to discharge.  Assessment and Plan: 1.  Acute pulmonary edema. Possible acute on chronic diastolic dysfunction. Volume overload. Presented with shortness of breath with severe volume overload from missing dialysis for 1 week. Underwent HD emergently overnight. Blood pressure significantly better. Volume better as well as breathing better. Continue HD per nephrology as per TTS as scheduled.  2.  Hypertensive urgency. From missing dialysis. Blood pressure now better. Continue current home regimen.  3.  Malfunctioning HD access. Patient has a [facility] which is not flowing well. IR consulted for replacement of the [facility]. Needs to be done prior to discharge so the patient continue to have HD outpatient without any problem.  4.  Type II DM.  Uncontrolled with hyperglycemia On long-term insulin at home On sensitive sliding scale insulin.  5.  Anemia from chronic kidney disease. Monitor H&H.  6.  Hypothyroidism. Continue Synthroid.  Diet: Renal diet, carb modified DVT Prophylaxis: subcutaneous Heparin  Advance goals of care discussion: full code  Family Communication: no family was present at bedside, at the time of interview.   Disposition:  Discharge to home once [facility] is done, may need HD prior to discharge tomorrow.  Consultants: nephrology, IR Procedures:  HD  Antibiotics: Anti-infectives (From admission, onward)   None       Objective: Physical Exam: Vitals:   08/29/18 0355 08/29/18 0749 08/29/18 1221 08/29/18 1659  BP: (!) 144/76 130/73 125/68 132/65  Pulse: 81 81 78 81  Resp: (!) 23 19 18 17   Temp: 98.5 F (36.9 C) 98.4 F (36.9 C) 98.6 F (37 C) 98.5 F (36.9 C)  TempSrc: Oral Oral Oral Oral  SpO2: 94% 99% 98% 97%  Weight:      Height:        Intake/Output Summary (Last 24 hours) at 08/29/2018 1708 Last data filed at 08/28/2018 2129 Gross per 24 hour  Intake 237 ml  Output -  Net 237 ml   Filed Weights   08/28/18 1634  Weight: 124.8 kg   General: Alert, Awake and Oriented to Time, Place and Person. Appear in mild distress, affect appropriate Eyes: PERRL, Conjunctiva normal ENT: Oral Mucosa clear moist. Neck: no JVD, no Abnormal Mass Or lumps Cardiovascular: S1 and S2 Present, no Murmur, Peripheral Pulses Present Respiratory: normal respiratory effort, Bilateral Air entry equal and Decreased, no use of accessory muscle, Clear to Auscultation, no Crackles, no wheezes Abdomen: Bowel Sound present, Soft and no tenderness, no hernia Skin: no redness, no Rash, no induration Extremities: bilateral  Pedal edema, no calf tenderness Neurologic: Grossly no focal neuro deficit. Bilaterally Equal motor strength  Data Reviewed: CBC: Recent Labs  Lab 08/25/18 1750 08/25/18 1819 08/27/18 2251 08/28/18 0625 08/28/18 1654  WBC 7.4  --  7.3 7.6 5.8  NEUTROABS  --   --   --  5.2  --   HGB 10.9* 10.9* 10.5* 10.3* 11.4*  HCT 34.3* 32.0*  32.3* 32.7* 35.7*  MCV 91.0  --  91.5 92.1 91.8  PLT 191  --  199 186 829   Basic Metabolic Panel: Recent Labs  Lab 08/25/18 1819 08/27/18 2251 08/28/18 0625 08/28/18 1957  NA 135 138 137 137  K 5.0 5.9* 6.3* 4.9  CL 100 98 100 101  CO2  --  19* 16* 22  GLUCOSE 87 154* 212* 204*  BUN 78* 90* 92* 56*  CREATININE 16.60* 18.35* 19.14* 13.26*  CALCIUM  --  9.4 9.4 8.5*  PHOS  --    --   --  6.7*    Liver Function Tests: Recent Labs  Lab 08/28/18 0625 08/28/18 1957  AST 13*  --   ALT 10  --   ALKPHOS 98  --   BILITOT 1.0  --   PROT 7.5  --   ALBUMIN 3.5 3.6   No results for input(s): LIPASE, AMYLASE in the last 168 hours. No results for input(s): AMMONIA in the last 168 hours. Coagulation Profile: No results for input(s): INR, PROTIME in the last 168 hours. Cardiac Enzymes: Recent Labs  Lab 08/28/18 0625 08/28/18 1027 08/28/18 1654  TROPONINI 0.09* 0.08* 0.10*   BNP (last 3 results) No results for input(s): PROBNP in the last 8760 hours. CBG: Recent Labs  Lab 08/28/18 1730 08/28/18 2222 08/29/18 0734 08/29/18 1237 08/29/18 1628  GLUCAP 165* 221* 215* 241* 162*   Studies: No results found.  Scheduled Meds: . amLODipine  10 mg Oral QHS  . atorvastatin  80 mg Oral QHS  . Chlorhexidine Gluconate Cloth  6 each Topical Q0600  . cinacalcet  60 mg Oral Q breakfast  . famotidine  20 mg Oral Daily  . gabapentin  300 mg Oral QHS  . heparin  5,000 Units Subcutaneous Q8H  . hydrALAZINE  50 mg Oral Q8H  . insulin aspart  0-15 Units Subcutaneous TID WC  . levothyroxine  25 mcg Oral Q breakfast  . lidocaine  2 patch Transdermal Q24H  . metoprolol tartrate  100 mg Oral BID  . pantoprazole  40 mg Oral Q0600  . polyethylene glycol  17 g Oral Daily  . senna  2 tablet Oral QHS  . sevelamer carbonate  1,600 mg Oral TID AC   Continuous Infusions: . sodium chloride    . sodium chloride     PRN Meds: sodium chloride, sodium chloride, acetaminophen **OR** acetaminophen, alteplase, heparin, heparin, hydrALAZINE, lidocaine (PF), lidocaine-prilocaine, loperamide, ondansetron **OR** ondansetron (ZOFRAN) IV, pentafluoroprop-tetrafluoroeth  Time spent: 35 minutes  Author: Berle Mull, MD [facility] Pager: [phone] 08/29/2018 5:08 PM  If 7PM-7AM, please contact night-coverage at [email or web address], password [facility]

## 2018-08-29 NOTE — Progress Notes (Signed)
Inpatient Diabetes Program Recommendations  AACE/ADA: New Consensus Statement on Inpatient Glycemic Control (2015)  Target Ranges:  Prepandial:   less than 140 mg/dL      Peak postprandial:   less than 180 mg/dL (1-2 hours)      Critically ill patients:  140 - 180 mg/dL    Results for JAYEN, BROMWELL (MRN [id number]) as of 08/29/2018 12:57  Ref. Range 08/28/2018 11:43 08/28/2018 17:30 08/28/2018 22:22 08/29/2018 07:34 08/29/2018 12:37  Glucose-Capillary Latest Ref Range: 70 - 99 mg/dL 255 (H) 165 (H) 221 (H) 215 (H) 241 (H)    Diabetes history: DM2  Outpatient Diabetes medications: Novolog 5 units tidwc  Current orders for Inpatient glycemic control: Novolog 0-9 units tidwc  FBS > 180 mg/dL. May benefit from addition of basal insulin.  Inpatient Diabetes Program Recommendations:     Add Lantus 8 units QHS. Add Novolog 2 units tidwc if pt eats > 50% meal.  Recheck A1c (last one documented 2011)  -- Will follow during hospitalization.--  Jonna Clark RN, MSN Diabetes Coordinator Inpatient Glycemic Control Team Team Pager: [phone] (8am-5pm)   Continue to follow.

## 2018-08-29 NOTE — Progress Notes (Signed)
[facility] PROGRESS NOTE  Assessment/ Plan: Pt is a 52 y.o. yo male with hypertension, type 2 diabetes, right BKA, ESRD TTS at [place] presented with shortness of breath and missing dialysis for a week.  Apparently the catheter was not working at outpatient as the center therefore patient was advised to go to ER.  #Shortness of breath/volume overload due to missing dialysis: Status post dialysis yesterday with 4 L of.  Breathing is better today.  Plan for next dialysis tomorrow.  # ESRD TTS at [place]. Discussed with IR and primary team for new catheter placement. Problem with blood flow through the catheter yesterday.  Patient is stable to discharge home today after [facility] exchanged.  If he remains in the hospital then we will dialysis tomorrow.  # Anemia: Hemoglobin 11.4.  # Secondary hyperparathyroidism: Calcium 8.5, phosphorus 6.7.  Continue Renvela.  # HTN/volume status: BP acceptable.  UF with dialysis tomorrow.  Continue current antihypertensive medication.   Subjective: Seen and examined at bedside.  Denies headache, dizziness, chest pain.  Shortness of breath is better.  His wife at the bedside. Objective Vital signs in last 24 hours: Vitals:   08/28/18 2332 08/29/18 0306 08/29/18 0355 08/29/18 0749  BP: (!) 158/88 (!) 147/81 (!) 144/76 130/73  Pulse: 89 83 81 81  Resp: (!) 25 (!) 22 (!) 23 19  Temp: 98.8 F (37.1 C) 98.6 F (37 C) 98.5 F (36.9 C) 98.4 F (36.9 C)  TempSrc: Oral Oral Oral Oral  SpO2: 96% 95% 94% 99%  Weight:      Height:       Weight change:   Intake/Output Summary (Last 24 hours) at 08/29/2018 1254 Last data filed at 08/28/2018 2129 Gross per 24 hour  Intake 237 ml  Output 4000 ml  Net -3763 ml       Labs: Basic Metabolic Panel: Recent Labs  Lab 08/27/18 2251 08/28/18 0625 08/28/18 1957  NA 138 137 137  K 5.9* 6.3* 4.9  CL 98 100 101  CO2 19* 16* 22  GLUCOSE 154* 212* 204*  BUN 90* 92* 56*  CREATININE 18.35* 19.14*  13.26*  CALCIUM 9.4 9.4 8.5*  PHOS  --   --  6.7*   Liver Function Tests: Recent Labs  Lab 08/28/18 0625 08/28/18 1957  AST 13*  --   ALT 10  --   ALKPHOS 98  --   BILITOT 1.0  --   PROT 7.5  --   ALBUMIN 3.5 3.6   No results for input(s): LIPASE, AMYLASE in the last 168 hours. No results for input(s): AMMONIA in the last 168 hours. CBC: Recent Labs  Lab 08/25/18 1750  08/27/18 2251 08/28/18 0625 08/28/18 1654  WBC 7.4  --  7.3 7.6 5.8  NEUTROABS  --   --   --  5.2  --   HGB 10.9*   < > 10.5* 10.3* 11.4*  HCT 34.3*   < > 32.3* 32.7* 35.7*  MCV 91.0  --  91.5 92.1 91.8  PLT 191  --  199 186 183   < > = values in this interval not displayed.   Cardiac Enzymes: Recent Labs  Lab 08/28/18 0625 08/28/18 1027 08/28/18 1654  TROPONINI 0.09* 0.08* 0.10*   CBG: Recent Labs  Lab 08/28/18 1143 08/28/18 1730 08/28/18 2222 08/29/18 0734 08/29/18 1237  GLUCAP 255* 165* 221* 215* 241*    Iron Studies: No results for input(s): IRON, TIBC, TRANSFERRIN, FERRITIN in the last 72 hours. Studies/Results:  Dg Chest 2 View  Result Date: 08/27/2018 CLINICAL DATA:  Shortness of breath EXAM: CHEST - 2 VIEW COMPARISON:  07/29/2018 FINDINGS: Left-sided central venous catheter tips over the SVC. Low lung volumes with basilar atelectasis. Enlarged cardiomediastinal silhouette with mild central congestion. No pneumothorax. IMPRESSION: 1. Low lung volumes with basilar atelectasis 2. Cardiomegaly with vascular congestion Electronically Signed   By: Donavan Foil M.D.   On: 08/27/2018 23:30    Medications: Infusions: . sodium chloride    . sodium chloride      Scheduled Medications: . amLODipine  10 mg Oral QHS  . atorvastatin  80 mg Oral QHS  . Chlorhexidine Gluconate Cloth  6 each Topical Q0600  . cinacalcet  60 mg Oral Q breakfast  . gabapentin  300 mg Oral QHS  . heparin  5,000 Units Subcutaneous Q8H  . hydrALAZINE  50 mg Oral Q8H  . insulin aspart  0-9 Units Subcutaneous TID  WC  . levothyroxine  25 mcg Oral Q breakfast  . lidocaine  2 patch Transdermal Q24H  . metoprolol tartrate  100 mg Oral BID  . pantoprazole  40 mg Oral Q0600  . polyethylene glycol  17 g Oral Daily  . senna  2 tablet Oral QHS  . sevelamer carbonate  1,600 mg Oral TID AC    have reviewed scheduled and prn medications.  Physical Exam: General:NAD, comfortable Heart:RRR, s1s2 nl Lungs:clear b/l, no cracjle Abdomen:soft, Non-tender, non-distended Extremities:No edema Dialysis Access: [facility] catheter  Dron Prasad Bhandari 08/29/2018,12:54 PM  LOS: 1 day

## 2018-08-30 ENCOUNTER — Inpatient Hospital Stay (HOSPITAL_COMMUNITY): Payer: Medicare Other

## 2018-08-30 ENCOUNTER — Encounter (HOSPITAL_COMMUNITY): Payer: Self-pay | Admitting: Interventional Radiology

## 2018-08-30 HISTORY — PX: IR FLUORO GUIDE CV LINE LEFT: IMG2282

## 2018-08-30 LAB — COMPREHENSIVE METABOLIC PANEL
ALBUMIN: 3.4 g/dL — AB (ref 3.5–5.0)
ALT: 9 U/L (ref 0–44)
ANION GAP: 16 — AB (ref 5–15)
AST: 13 U/L — AB (ref 15–41)
Alkaline Phosphatase: 98 U/L (ref 38–126)
BUN: 76 mg/dL — ABNORMAL HIGH (ref 6–20)
CO2: 18 mmol/L — ABNORMAL LOW (ref 22–32)
Calcium: 7.8 mg/dL — ABNORMAL LOW (ref 8.9–10.3)
Chloride: 100 mmol/L (ref 98–111)
Creatinine, Ser: 16.25 mg/dL — ABNORMAL HIGH (ref 0.61–1.24)
GFR calc Af Amer: 3 mL/min — ABNORMAL LOW (ref >60)
GFR calc non Af Amer: 3 mL/min — ABNORMAL LOW (ref >60)
GLUCOSE: 264 mg/dL — AB (ref 70–99)
POTASSIUM: 6.3 mmol/L — AB (ref 3.5–5.1)
SODIUM: 134 mmol/L — AB (ref 135–145)
Total Bilirubin: 0.7 mg/dL (ref 0.3–1.2)
Total Protein: 7 g/dL (ref 6.5–8.1)

## 2018-08-30 LAB — CBC WITH DIFFERENTIAL/PLATELET
Abs Immature Granulocytes: 0 10*3/uL (ref 0.0–0.1)
BASOS PCT: 1 %
Basophils Absolute: 0.1 10*3/uL (ref 0.0–0.1)
Eosinophils Absolute: 0.4 10*3/uL (ref 0.0–0.7)
Eosinophils Relative: 4 %
HCT: 31.1 % — ABNORMAL LOW (ref 39.0–52.0)
HEMOGLOBIN: 9.7 g/dL — AB (ref 13.0–17.0)
IMMATURE GRANULOCYTES: 0 %
Lymphocytes Relative: 22 %
Lymphs Abs: 2 10*3/uL (ref 0.7–4.0)
MCH: 29.5 pg (ref 26.0–34.0)
MCHC: 31.2 g/dL (ref 30.0–36.0)
MCV: 94.5 fL (ref 78.0–100.0)
Monocytes Absolute: 0.9 10*3/uL (ref 0.1–1.0)
Monocytes Relative: 10 %
NEUTROS PCT: 63 %
Neutro Abs: 5.5 10*3/uL (ref 1.7–7.7)
Platelets: 198 10*3/uL (ref 150–400)
RBC: 3.29 MIL/uL — AB (ref 4.22–5.81)
RDW: 14.1 % (ref 11.5–15.5)
WBC: 8.9 10*3/uL (ref 4.0–10.5)

## 2018-08-30 LAB — GLUCOSE, CAPILLARY
Glucose-Capillary: 152 mg/dL — ABNORMAL HIGH (ref 70–99)
Glucose-Capillary: 175 mg/dL — ABNORMAL HIGH (ref 70–99)
Glucose-Capillary: 251 mg/dL — ABNORMAL HIGH (ref 70–99)

## 2018-08-30 MED ORDER — HEPARIN SODIUM (PORCINE) 1000 UNIT/ML IJ SOLN
INTRAMUSCULAR | Status: AC
  Filled 2018-08-30: qty 1

## 2018-08-30 MED ORDER — LIDOCAINE-EPINEPHRINE (PF) 1 %-1:200000 IJ SOLN
INTRAMUSCULAR | Status: AC
  Administered 2018-08-30: 10 mL
  Filled 2018-08-30: qty 30

## 2018-08-30 MED ORDER — SENNA 8.6 MG PO TABS: 2.0000 | ORAL_TABLET | Freq: Every day | ORAL | 0 refills | Status: AC | PRN

## 2018-08-30 MED ORDER — POLYETHYLENE GLYCOL 3350 17 G PO PACK: 17.0000 g | PACK | Freq: Every day | ORAL | 0 refills | Status: AC | PRN

## 2018-08-30 MED ORDER — CHLORHEXIDINE GLUCONATE 4 % EX LIQD
CUTANEOUS | Status: AC
  Filled 2018-08-30: qty 15

## 2018-08-30 MED ORDER — FAMOTIDINE 20 MG PO TABS: 20.0000 mg | ORAL_TABLET | Freq: Every day | ORAL | 0 refills | Status: AC

## 2018-08-30 NOTE — Procedures (Signed)
Patient was seen on dialysis and the procedure was supervised.   BFR 400  Via [facility] TDC BP is  Acceptable UF goal 3-4 L as tolerated, 2 K  Had new TDC this am by IR. Next HD at outpatient HD center on Tuesday. Ok to discharge per renal perspective. D/w primary team.   Patient appears to be tolerating treatment well  David Walsh 08/30/2018

## 2018-08-30 NOTE — Progress Notes (Signed)
CRITICAL VALUE ALERT  Critical Value:  Potassium 6.3  Date & Time Notied:  9/14 0514  Provider Notified: Kennon Holter   Orders Received/Actions taken: No orders received. Will continue to monitor pt.

## 2018-08-30 NOTE — Sedation Documentation (Signed)
Pt assisted to procedure table. Self transfer. T-shirt removed and given warm blankets.

## 2018-08-30 NOTE — Procedures (Signed)
Pre-procedure Diagnosis: ESRD Post-procedure Diagnosis: Same  Successful replacement of tunneled HD catheter with tips terminating within the superior aspect of the right atrium.    Complications: None Immediate  EBL: Minimal   The catheter is ready for immediate use.   Ronny Bacon, MD Pager #: [phone]

## 2018-09-01 LAB — GLUCOSE, CAPILLARY: GLUCOSE-CAPILLARY: 219 mg/dL — AB (ref 70–99)

## 2018-09-02 NOTE — Discharge Summary (Addendum)
[facility] Discharge Summary   Patient: David Walsh GEX:[id number]   [facility]: [facility] DOB: [birth year]   Date of admission: 08/27/2018   Date of discharge: 08/30/2018     Discharge Diagnoses:  Principal Problem:   Acute pulmonary edema ([employer]) Active Problems:   Type 2 diabetes mellitus with renal manifestations (HCC)   ESRD (end stage renal disease) (HCC)   Hx of BKA, right ([place])   Hypertensive urgency   Hypothyroidism   Admitted From: home Disposition:  Home  Recommendations for Outpatient Follow-up:  1. Please follow up with PCP in 1 week   [place]. Schedule an appointment as soon as possible for a visit in 1 week(s).   Specialty:  General Practice Contact information: [place] [phone]          Diet recommendation: home  Activity: The patient is advised to gradually reintroduce usual activities.  Discharge Condition: good  Code Status: full code  History of present illness: As per the H and P dictated on admission, "David Walsh is a 52 y.o. male with history of ESRD on hemodialysis on Tuesday Thursday Saturday who has missed his dialysis for last 3 sessions due to difficulty accessing is dialysis catheter and patient states it looks like it is prolonged presents to the ER because of shortness of breath.  Has been having progressive shortness of breath last 1 week denies any chest pain fever chills productive cough nausea vomiting abdominal pain or diarrhea.  Has been taking his medicines as advised.  Patient was admitted last month for bacteremia and upper back abscess and at that time patient's catheter was changed.  ED Course: In the ER patient blood pressure is more than 956 systolic chest x-ray shows congestion.  Patient finds it difficult to lie flat due to shortness of breath.  But is not hypoxic.  Patient admitted for acute pulmonary edema and hypertensive urgency."  Hospital  Course:  Summary of his active problems in the hospital is as following. 1.  Acute pulmonary edema. Possible acute on chronic diastolic CHF. Volume overload. Presented with shortness of breath with severe volume overload from missing dialysis for 1 week. Underwent HD emergently overnight. Blood pressure significantly better. Volume better as well as breathing better. Continue HD per nephrology as per TTS as scheduled.  2.  Hypertensive urgency. From missing dialysis. Blood pressure now better. Continue current home regimen.  3.  Malfunctioning HD access. Patient has a [facility] which is not flowing well. IR performed for replacement of the TDC.  4.  Type II DM.  Uncontrolled with hyperglycemia On long-term insulin at home  5.  Anemia from chronic kidney disease. stable  6.  Hypothyroidism. Continue Synthroid.   All other chronic medical condition were stable during the hospitalization.  Patient was ambulatory without any assistance.  On the day of the discharge the patient's vitals were stable , and no other acute medical condition were reported by patient. the patient was felt safe to be discharge at home with family.  Consultants: nephrology, IR Procedures: HD TDC placement   DISCHARGE MEDICATION: Allergies as of 08/30/2018      Reactions   Shellfish Allergy Anaphylaxis   Penicillins Itching, Rash   Has patient had a PCN reaction causing immediate rash, facial/tongue/throat swelling, SOB or lightheadedness with hypotension: Yes Has patient had a PCN reaction causing severe rash involving mucus membranes or skin necrosis: No Has patient had a PCN reaction that  required hospitalization: No Has patient had a PCN reaction occurring within the last 10 years: No If all of the above answers are "NO", then may proceed with Cephalosporin use.      Medication List    TAKE these medications   amLODipine 10 MG tablet Commonly known as:  NORVASC Take 10 mg by mouth at  bedtime.   atorvastatin 80 MG tablet Commonly known as:  LIPITOR Take 80 mg by mouth at bedtime.   cinacalcet 60 MG tablet Commonly known as:  SENSIPAR Take 60 mg by mouth daily.   famotidine 20 MG tablet Commonly known as:  PEPCID Take 1 tablet (20 mg total) by mouth daily for 14 days.   gabapentin 600 MG tablet Commonly known as:  NEURONTIN Take 0.5 tablets (300 mg total) by mouth at bedtime.   hydrALAZINE 50 MG tablet Commonly known as:  APRESOLINE Take 50 mg by mouth every 8 (eight) hours.   HYDROcodone-acetaminophen 5-325 MG tablet Commonly known as:  NORCO/VICODIN Take 1-2 tablets by mouth every 6 (six) hours as needed for moderate pain or severe pain.   insulin aspart 100 UNIT/ML injection Commonly known as:  novoLOG Inject 5 Units into the skin 3 (three) times daily before meals.   levothyroxine 25 MCG tablet Commonly known as:  SYNTHROID, LEVOTHROID Take 25 mcg by mouth daily.   lidocaine 5 % Commonly known as:  LIDODERM Place 2 patches onto the skin daily. Remove & Discard patch within 12 hours or as directed by MD   metoprolol tartrate 100 MG tablet Commonly known as:  LOPRESSOR Take 100 mg by mouth 2 (two) times daily.   mupirocin ointment 2 % Commonly known as:  BACTROBAN Apply 1 application topically 2 (two) times daily.   NUTRITIONAL SUPPLEMENT PO Take 1 Scoop by mouth 2 (two) times daily as needed. Propass Powder   oxyCODONE 5 MG immediate release tablet Commonly known as:  Oxy IR/ROXICODONE Take 1 tablet (5 mg total) by mouth every 4 (four) hours as needed for severe pain.   pantoprazole 40 MG tablet Commonly known as:  PROTONIX Take 1 tablet (40 mg total) by mouth daily at 6 (six) AM.   polyethylene glycol packet Commonly known as:  MIRALAX / GLYCOLAX Take 17 g by mouth daily as needed for mild constipation.   senna 8.6 MG Tabs tablet Commonly known as:  SENOKOT Take 2 tablets (17.2 mg total) by mouth daily as needed for mild  constipation. What changed:    when to take this  reasons to take this   sevelamer carbonate 800 MG tablet Commonly known as:  RENVELA Take 1,600 mg by mouth 3 (three) times daily before meals.   sildenafil 100 MG tablet Commonly known as:  VIAGRA Take 100 mg by mouth as needed for erectile dysfunction (take one hour prior to activity).      Allergies  Allergen Reactions  . Shellfish Allergy Anaphylaxis  . Penicillins Itching and Rash    Has patient had a PCN reaction causing immediate rash, facial/tongue/throat swelling, SOB or lightheadedness with hypotension: Yes Has patient had a PCN reaction causing severe rash involving mucus membranes or skin necrosis: No Has patient had a PCN reaction that required hospitalization: No Has patient had a PCN reaction occurring within the last 10 years: No If all of the above answers are "NO", then may proceed with Cephalosporin use.    Discharge Instructions    Diet renal 60/[id number]   Complete by:  As directed  Increase activity slowly   Complete by:  As directed      Discharge Exam: Filed Weights   08/28/18 1634 08/30/18 1140 08/30/18 1556  Weight: 124.8 kg 130.4 kg 126.6 kg   Vitals:   08/30/18 1556 08/30/18 1648  BP: (!) 98/58 119/69  Pulse: 76 75  Resp: 16 20  Temp: (!) 97.3 F (36.3 C)   SpO2: 98% 98%   General: Appear in no distress, no Rash; Oral Mucosa moist. Cardiovascular: S1 and S2 Present, no Murmur, no JVD Respiratory: Bilateral Air entry present and Clear to Auscultation, no Crackles, no wheezes Abdomen: Bowel Sound present, Soft and no tenderness Extremities: no Pedal edema, no calf tenderness Neurology: Grossly no focal neuro deficit.  The results of significant diagnostics from this hospitalization (including imaging, microbiology, ancillary and laboratory) are listed below for reference.    Significant Diagnostic Studies: Dg Chest 2 View  Result Date: 08/27/2018 CLINICAL DATA:  Shortness of  breath EXAM: CHEST - 2 VIEW COMPARISON:  07/29/2018 FINDINGS: Left-sided central venous catheter tips over the SVC. Low lung volumes with basilar atelectasis. Enlarged cardiomediastinal silhouette with mild central congestion. No pneumothorax. IMPRESSION: 1. Low lung volumes with basilar atelectasis 2. Cardiomegaly with vascular congestion Electronically Signed   By: Donavan Foil M.D.   On: 08/27/2018 23:30   Ir Fluoro Guide Cv Line Left  Result Date: 08/30/2018 INDICATION: End-stage renal disease, now with poorly functioning dialysis catheter Dialysis catheter was placed on 07/19/2018 by Dr. Laurence Ferrari. EXAM: TUNNELED CENTRAL VENOUS HEMODIALYSIS CATHETER REPLACEMENT WITH FLUOROSCOPIC GUIDANCE MEDICATIONS: None FLUOROSCOPY TIME:  18 seconds (4.2 mGy) COMPLICATIONS: None immediate. PROCEDURE: Informed written consent was obtained from the patient after a discussion of the risks, benefits, and alternatives to treatment. Questions regarding the procedure were encouraged and answered. The skin and external portion of the existing hemodialysis catheter was prepped with chlorhexidine in a sterile fashion, and a sterile drape was applied covering the operative field. Maximum barrier sterile technique with sterile gowns and gloves were used for the procedure. A timeout was performed prior to the initiation of the procedure. Both lumens of the hemodialysis catheter were cannulated with a stiff Glidewire and advanced to the level of the IVC. Under intermittent fluoroscopic guidance, the existing dialysis catheter was exchanged for a new , slightly larger now 28 cm tip to cuff cm tip to cuff palindrome dialysis catheter with tips ultimately terminating within the superior aspect of the right atrium. Final catheter positioning was confirmed and documented with a spot radiographic image. The catheter aspirates and flushes normally. The catheter was flushed with appropriate volume heparin dwells. The catheter exit site was  secured with a 0-Prolene retention sutures. Both lumens were heparinized. A dressing was placed. The patient tolerated the procedure well without immediate post procedural complication. IMPRESSION: Successful replacement of 28 cm tip to cuff tunneled hemodialysis catheter with tips terminating within the right atrium. The catheter is ready for immediate use. Electronically Signed   By: Sandi Mariscal M.D.   On: 08/30/2018 09:55    Microbiology: Recent Results (from the past 240 hour(s))  MRSA PCR Screening     Status: None   Collection Time: 08/28/18 11:01 AM  Result Value Ref Range Status   MRSA by PCR NEGATIVE NEGATIVE Final    Comment:        The GeneXpert MRSA Assay (FDA approved for NASAL specimens only), is one component of a comprehensive MRSA colonization surveillance program. It is not intended to diagnose MRSA infection nor to  guide or monitor treatment for MRSA infections. Performed at [facility], [place] [address] [place], [place] [address]      Labs: CBC: Recent Labs  Lab 08/27/18 2251 08/28/18 0625 08/28/18 1654 08/30/18 0339  WBC 7.3 7.6 5.8 8.9  NEUTROABS  --  5.2  --  5.5  HGB 10.5* 10.3* 11.4* 9.7*  HCT 32.3* 32.7* 35.7* 31.1*  MCV 91.5 92.1 91.8 94.5  PLT 199 186 183 276   Basic Metabolic Panel: Recent Labs  Lab 08/27/18 2251 08/28/18 0625 08/28/18 1957 08/30/18 0339  NA 138 137 137 134*  K 5.9* 6.3* 4.9 6.3*  CL 98 100 101 100  CO2 19* 16* 22 18*  GLUCOSE 154* 212* 204* 264*  BUN 90* 92* 56* 76*  CREATININE 18.35* 19.14* 13.26* 16.25*  CALCIUM 9.4 9.4 8.5* 7.8*  PHOS  --   --  6.7*  --    Liver Function Tests: Recent Labs  Lab 08/28/18 0625 08/28/18 1957 08/30/18 0339  AST 13*  --  13*  ALT 10  --  9  ALKPHOS 98  --  98  BILITOT 1.0  --  0.7  PROT 7.5  --  7.0  ALBUMIN 3.5 3.6 3.4*   No results for input(s): LIPASE, AMYLASE in the last 168 hours. No results for input(s): AMMONIA in the last 168 hours. Cardiac  Enzymes: Recent Labs  Lab 08/28/18 0625 08/28/18 1027 08/28/18 1654  TROPONINI 0.09* 0.08* 0.10*   BNP (last 3 results) No results for input(s): BNP in the last 8760 hours. CBG: Recent Labs  Lab 08/29/18 1628 08/29/18 2120 08/30/18 0907 08/30/18 1453 08/30/18 1643  GLUCAP 162* 152* 175* 219* 251*   Time spent: 35 minutes  Signed:  Berle Mull  [facility] 08/30/2018   , 11:02 PM

## 2018-09-04 ENCOUNTER — Encounter: Payer: Self-pay | Admitting: Gastroenterology

## 2018-09-04 ENCOUNTER — Telehealth: Payer: Self-pay

## 2018-09-04 ENCOUNTER — Ambulatory Visit (INDEPENDENT_AMBULATORY_CARE_PROVIDER_SITE_OTHER): Payer: Medicare Other | Admitting: Gastroenterology

## 2018-09-04 VITALS — BP 154/90 | HR 81 | Ht 78.0 in | Wt 279.0 lb

## 2018-09-04 DIAGNOSIS — E663 Overweight: Secondary | ICD-10-CM | POA: Diagnosis not present

## 2018-09-04 DIAGNOSIS — R1030 Lower abdominal pain, unspecified: Secondary | ICD-10-CM

## 2018-09-04 DIAGNOSIS — R937 Abnormal findings on diagnostic imaging of other parts of musculoskeletal system: Secondary | ICD-10-CM

## 2018-09-04 DIAGNOSIS — N186 End stage renal disease: Secondary | ICD-10-CM | POA: Diagnosis not present

## 2018-09-04 NOTE — Telephone Encounter (Signed)
-----   Message from Yetta Flock, MD sent at 09/04/2018  2:49 PM EDT ----- Regarding: MRI Hi Jan,  I spoke with radiology about this patient, they are recommending a NON contrast MRI of the lumbar spine to further evaluate his prior CT abnormality. Can you help coordinate non-contrast MRI spine? Thanks much, he is expecting to hear from [place] about this.

## 2018-09-04 NOTE — Telephone Encounter (Signed)
Called and spoke to pt. Let him know radiology is recommending MRI withOUT contrast. He confirmed that he needs some anti-anxiety medication for the procedure, does not have a pacemaker and that [facility] is Ok to schedule at. Order placed.  MRI scheduled for Thursday, September 26th at [facility] at 1:00pm, arrive at 12:30pm.  No prep.

## 2018-09-04 NOTE — Patient Instructions (Signed)
If you are age 53 or older, your body mass index should be between 23-30. Your Body mass index is 32.24 kg/m. If this is out of the aforementioned range listed, please consider follow up with your Primary Care Provider.  If you are age 28 or younger, your body mass index should be between 19-25. Your Body mass index is 32.24 kg/m. If this is out of the aformentioned range listed, please consider follow up with your Primary Care Provider.    The number for [facility] is [phone].  Please ask your Nephrologist is you are able to take Desipramine for pain management.  Thank you for entrusting me with your care and for choosing [facility], Dr. Meridian Cellar

## 2018-09-04 NOTE — Progress Notes (Signed)
HPI :  52 year old male here for follow-up visit. I met him initially in August 1 he was hospitalized with an abscess on his back, and had endorsed chronic and severe abdominal pain. We evaluated him for abdominal pain. Patient has had chronic lower abdominal pain since his peritoneal dialysis catheter was placed, at the site in the right mid lower abdomen. He also has pain in the LLQ. Pain present all the time, never goes away, tender to light touch, worse with movements / coughing. He has had multiple CT scans which have not showed a clear cause. He reports he has had 3 different opinions for his abdominal pain and has not had any improvement or clear diagnosis previously. His colonoscopy was done in 2018 which was normal. He did have a CT angio about a year ago at another hospital showing some stenosis of the SMA, but he has not had postprandial component from what he describes. Incidentally on imaging he had a L2 Vertebral body lesion which needed further workup.   I thought he more than likely had neuropathic pain based on my previous evaluation. We gave him a trial of gabapentin and start him on lidocaine patches. He states that lidocaine patches haven't helped much. He try to gabapentin but had an allergic reaction has not taken it since. He was given MiraLAX which is working okay for constipation.   He is been able to lose about 100 pounds with diet over the past few years or so. His body mass index is now 32. He is very frustrated by his ongoing abdominal pain. He is trying to get a kidney transplant in the next year or so, his wife is donated a kidney to him. He is trying to reduce his weight to improve his pain. He asks about surgical management for this issue. We discussed options as outlined below.  Colonoscopy 2018   Past Medical History:  Diagnosis Date  . Anxiety   . Arthritis   . Diabetes mellitus without complication ([place])   . Hx of BKA ([place])   . Neuropathy   . Renal disorder        Past Surgical History:  Procedure Laterality Date  . BELOW KNEE LEG AMPUTATION  2014   Right.  Due to non-healing foot ulcer.  . IR FLUORO GUIDE CV LINE LEFT  07/19/2018  . IR FLUORO GUIDE CV LINE LEFT  08/30/2018  . IR REMOVAL TUN CV CATH W/O FL  07/18/2018  . IR US GUIDE VASC ACCESS LEFT  07/19/2018  . REPLACEMENT TOTAL KNEE Left    Family History  Problem Relation Age of Onset  . Arthritis Mother   . Heart attack Father   . Hypertension Sister   . Colon cancer Neg Hx   . Stomach cancer Neg Hx   . Rectal cancer Neg Hx   . Pancreatic cancer Neg Hx    Social History   Tobacco Use  . Smoking status: Former Smoker    Types: Cigars  . Smokeless tobacco: Never Used  Substance Use Topics  . Alcohol use: Not Currently  . Drug use: Not Currently   Current Outpatient Medications  Medication Sig Dispense Refill  . amLODipine (NORVASC) 10 MG tablet Take 10 mg by mouth at bedtime.    Marland Kitchen atorvastatin (LIPITOR) 80 MG tablet Take 80 mg by mouth at bedtime.    . cinacalcet (SENSIPAR) 60 MG tablet Take 60 mg by mouth daily.    . famotidine (PEPCID) 20 MG tablet Take  1 tablet (20 mg total) by mouth daily for 14 days. 14 tablet 0  . gabapentin (NEURONTIN) 600 MG tablet Take 0.5 tablets (300 mg total) by mouth at bedtime. 15 tablet 0  . hydrALAZINE (APRESOLINE) 50 MG tablet Take 50 mg by mouth every 8 (eight) hours.    Marland Kitchen HYDROcodone-acetaminophen (NORCO/VICODIN) 5-325 MG tablet Take 1-2 tablets by mouth every 6 (six) hours as needed for moderate pain or severe pain. 15 tablet 0  . insulin aspart (NOVOLOG) 100 UNIT/ML injection Inject 5 Units into the skin 3 (three) times daily before meals.    Marland Kitchen levothyroxine (SYNTHROID, LEVOTHROID) 25 MCG tablet Take 25 mcg by mouth daily.    Marland Kitchen lidocaine (LIDODERM) 5 % Place 2 patches onto the skin daily. Remove & Discard patch within 12 hours or as directed by MD 30 patch 0  . metoprolol tartrate (LOPRESSOR) 100 MG tablet Take 100 mg by mouth 2 (two) times  daily.    . mupirocin ointment (BACTROBAN) 2 % Apply 1 application topically 2 (two) times daily. 22 g 0  . Nutritional Supplements (NUTRITIONAL SUPPLEMENT PO) Take 1 Scoop by mouth 2 (two) times daily as needed. Propass Powder    . oxyCODONE (ROXICODONE) 5 MG immediate release tablet Take 1 tablet (5 mg total) by mouth every 4 (four) hours as needed for severe pain. 6 tablet 0  . pantoprazole (PROTONIX) 40 MG tablet Take 1 tablet (40 mg total) by mouth daily at 6 (six) AM. 30 tablet 0  . polyethylene glycol (MIRALAX / GLYCOLAX) packet Take 17 g by mouth daily as needed for mild constipation. 14 packet 0  . senna (SENOKOT) 8.6 MG TABS tablet Take 2 tablets (17.2 mg total) by mouth daily as needed for mild constipation. 60 each 0  . sevelamer carbonate (RENVELA) 800 MG tablet Take 1,600 mg by mouth 3 (three) times daily before meals.    . sildenafil (VIAGRA) 100 MG tablet Take 100 mg by mouth as needed for erectile dysfunction (take one hour prior to activity).     No current facility-administered medications for this visit.    Allergies  Allergen Reactions  . Shellfish Allergy Anaphylaxis  . Penicillins Itching and Rash    Has patient had a PCN reaction causing immediate rash, facial/tongue/throat swelling, SOB or lightheadedness with hypotension: Yes Has patient had a PCN reaction causing severe rash involving mucus membranes or skin necrosis: No Has patient had a PCN reaction that required hospitalization: No Has patient had a PCN reaction occurring within the last 10 years: No If all of the above answers are "NO", then may proceed with Cephalosporin use.      Review of Systems: All systems reviewed and negative except where noted in HPI.   Lab Results  Component Value Date   WBC 8.9 08/30/2018   HGB 9.7 (L) 08/30/2018   HCT 31.1 (L) 08/30/2018   MCV 94.5 08/30/2018   PLT 198 08/30/2018    Lab Results  Component Value Date   CREATININE 16.25 (H) 08/30/2018   BUN 76 (H)  08/30/2018   NA 134 (L) 08/30/2018   K 6.3 (HH) 08/30/2018   CL 100 08/30/2018   CO2 18 (L) 08/30/2018    Lab Results  Component Value Date   ALT 9 08/30/2018   AST 13 (L) 08/30/2018   ALKPHOS 98 08/30/2018   BILITOT 0.7 08/30/2018     Physical Exam: BP (!) 154/90   Pulse 81   Ht 6' 6"  (1.981 m)  Wt 279 lb (126.6 kg)   BMI 32.24 kg/m  Constitutional: Pleasant,well-developed, male in no acute distress. HEENT: Normocephalic and atraumatic. Conjunctivae are normal. No scleral icterus. Neck supple.  Cardiovascular: Normal rate, regular rhythm.  Pulmonary/chest: Effort normal and breath sounds normal. No wheezing, rales or rhonchi. Abdominal: Soft, protuberant, diffuse tenderness in lower abdomen.  There are no masses palpable. No hepatomegaly. Extremities: , R lower leg amputee with prosthesis Lymphadenopathy: No cervical adenopathy noted. Neurological: Alert and oriented to person place and time. Skin: Skin is warm and dry. No rashes noted. Psychiatric: Normal mood and affect. Behavior is normal.   ASSESSMENT AND PLAN: 52 year old male here for reassessment of following issues:  Chronic abdominal pain / overweight / end-stage renal disease / abnormal spine imaging - as above, I suspect his chronic abdominal pain is neuropathic or musculoskeletal given description of it and lack of other findings given his extensive workup to date. He does have a lesion noted in the L spine on imaging, unclear if this is related to his back pain, but follow up imaging is recommended. I spoke with radiology today, they are recommending non contrast MRI of the L spine to further evaluate, and will coordinate this for him. Otherwise, we have discussed neuropathic pain / abdominal wall pain at length and the hospital and again today. Unfortunately had a reaction to gabapentin and didn't tolerate it, while lidocaine patches have not helped. I ultimately think he would benefit from a pain management  consultation to discuss other medical therapy versus trial of trigger point injection. We discussed this option and he feels strongly against seeing a pain management physician. Unfortunately he is not a candidate for Cymbalta due to his end-stage renal disease, we discussed considering a TCA, would need approval from his nephrologist before starting that. He will ask his nephrologist about desipramine. Moving forward again I do feel that pain management might be his best bet for obtain relief from this and ask that he reconsider it. We also discussed that weight loss may help reduce his pain is well. He has questions about surgical therapy for weight loss. His BMI is 32 given his height, and in light of his comorbidities and his ability to lose weight on his own (100 lbs over 2 years), I don't think he is a good candidate for bariatric surgery. Despite my advice about this, he is requesting to see a surgeon about bariatric surgery. He can call surgical office to discuss attending one of their free classes to learn more about this. 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   For now, will await his MRI spine, see if he can start a TCA, and continue to work on weight loss. If he changes his mind and wants to see pain management, he can contact me for a referral.  Total time spent > 45 minutes  [place] Cellar, MD [facility]

## 2018-09-04 NOTE — Telephone Encounter (Signed)
Called patient. He has dialysis that day. Called and rescheduled for Friday, 09-12-18 at 1:00pm, to arrive at 12:30pm.  No prep needed. Patient to go to [address].  He reports that he DOES have metal in his hands, arms and 'coiling' in his knee. He will need to call radiology to furthur describe all metal in his body.  Dr. Havery Moros - Ok to prescribe 1 (one) Ativan 1mg  to take 30 minutes prior? Or something else you want to use?

## 2018-09-05 MED ORDER — DIAZEPAM 5 MG PO TABS: 5.0000 mg | ORAL_TABLET | Freq: Once | ORAL | 0 refills | Status: AC

## 2018-09-05 NOTE — Telephone Encounter (Signed)
I would use one dose of valium 5mg  if he feels he needs something for anxiety. Thanks

## 2018-09-05 NOTE — Addendum Note (Signed)
Addended by: Roetta Sessions on: 09/05/2018 08:39 AM   Modules accepted: Orders

## 2018-09-05 NOTE — Telephone Encounter (Signed)
Called and LM for pt. Since he lives in [place] I am mailing him the script for 1(one) Valium 5 mg to be taken 30 minutes prior to MRI. Also, Radiology has more questions about the metal in his body and asked him to call [phone].

## 2018-09-11 ENCOUNTER — Ambulatory Visit (HOSPITAL_COMMUNITY): Payer: Medicare Other

## 2018-09-12 ENCOUNTER — Ambulatory Visit (HOSPITAL_COMMUNITY): Payer: Medicare Other

## 2018-09-12 ENCOUNTER — Ambulatory Visit (HOSPITAL_COMMUNITY)
Admission: RE | Admit: 2018-09-12 | Discharge: 2018-09-12 | Disposition: A | Discharge status: Home / Self Care | Payer: Medicare Other | Source: Ambulatory Visit | Attending: Gastroenterology | Admitting: Gastroenterology

## 2018-09-12 DIAGNOSIS — R937 Abnormal findings on diagnostic imaging of other parts of musculoskeletal system: Secondary | ICD-10-CM

## 2018-09-12 DIAGNOSIS — R1030 Lower abdominal pain, unspecified: Secondary | ICD-10-CM

## 2018-09-12 NOTE — Progress Notes (Signed)
Pt arrived on time but when we went to get him for his MRI he stated he broke his wrist and the pain meds were not helping so he needed to go to the ER and reschedule this appointment. Pt was given scheduling number so he can schedule himself.

## 2018-09-18 ENCOUNTER — Telehealth: Payer: Self-pay

## 2018-09-18 DIAGNOSIS — R1030 Lower abdominal pain, unspecified: Secondary | ICD-10-CM

## 2018-09-18 NOTE — Telephone Encounter (Signed)
Called and spoke to pt.  He is still interested in having MRI of spine done but he fell and broke his wrist and some teeth.  He asked that I reschedule the exam for 2 weeks from now since he's having surgery on his wrist tomorrow.  Perhaps MRI week of October 21st - not on Tues or Thurs b/c of dialysis. Pt is 279 lbs so need to schedule at [facility] since they have a larger MRI machine. Called them to schedule [phone]). They have to call the pt to schedule since he has metal in his arms and knees they have some screening questions to ask him before they will put him on the schedule.

## 2018-09-18 NOTE — Telephone Encounter (Signed)
-----   Message from Yetta Flock, MD sent at 09/17/2018  7:21 AM EDT ----- David Walsh looks like this patient did not follow through with scheduling his MRI of his spine. Can you call him to see if he still wishes to do this? Thanks  ----- Message ----- From: SYSTEM Sent: 09/17/2018  12:08 AM EDT To: Yetta Flock, MD

## 2018-09-18 NOTE — Telephone Encounter (Signed)
Thanks Jan 

## 2018-10-06 NOTE — Telephone Encounter (Signed)
Called [facility] and spoke to [place]. They called the pt to schedule him on 09-18-18 but he has not called back.  She will call him again today and see if she can get him scheduled.

## 2018-10-09 NOTE — Telephone Encounter (Signed)
Letter sent to pt asking him to contact [facility] at [phone] if he is still interested in reschedule MRI of Lumbar spine. They have called him and left messages but he has not returned their call.  They have questions about the metal in his arms and legs that must be answered before they can schedule him.

## 2018-11-06 DIAGNOSIS — Z79899 Other long term (current) drug therapy: Secondary | ICD-10-CM | POA: Diagnosis not present

## 2018-11-06 DIAGNOSIS — Z794 Long term (current) use of insulin: Secondary | ICD-10-CM | POA: Diagnosis not present

## 2018-11-06 DIAGNOSIS — Z992 Dependence on renal dialysis: Secondary | ICD-10-CM | POA: Diagnosis not present

## 2018-11-06 DIAGNOSIS — N2581 Secondary hyperparathyroidism of renal origin: Secondary | ICD-10-CM | POA: Diagnosis not present

## 2018-11-06 DIAGNOSIS — I120 Hypertensive chronic kidney disease with stage 5 chronic kidney disease or end stage renal disease: Secondary | ICD-10-CM | POA: Diagnosis not present

## 2018-11-06 DIAGNOSIS — D631 Anemia in chronic kidney disease: Secondary | ICD-10-CM | POA: Diagnosis not present

## 2018-11-06 DIAGNOSIS — N186 End stage renal disease: Secondary | ICD-10-CM | POA: Diagnosis not present

## 2018-11-06 DIAGNOSIS — Z7682 Awaiting organ transplant status: Secondary | ICD-10-CM | POA: Diagnosis not present

## 2018-11-06 DIAGNOSIS — E1122 Type 2 diabetes mellitus with diabetic chronic kidney disease: Secondary | ICD-10-CM | POA: Diagnosis not present

## 2020-01-13 DIAGNOSIS — S52512A Displaced fracture of left radial styloid process, initial encounter for closed fracture: Secondary | ICD-10-CM | POA: Diagnosis not present

## 2020-01-13 DIAGNOSIS — W19XXXA Unspecified fall, initial encounter: Secondary | ICD-10-CM | POA: Diagnosis not present

## 2020-01-13 DIAGNOSIS — M25532 Pain in left wrist: Secondary | ICD-10-CM | POA: Diagnosis not present

## 2020-01-13 DIAGNOSIS — R202 Paresthesia of skin: Secondary | ICD-10-CM | POA: Diagnosis not present

## 2020-01-13 DIAGNOSIS — W231XXA Caught, crushed, jammed, or pinched between stationary objects, initial encounter: Secondary | ICD-10-CM | POA: Diagnosis not present

## 2020-01-13 DIAGNOSIS — N184 Chronic kidney disease, stage 4 (severe): Secondary | ICD-10-CM | POA: Diagnosis not present

## 2020-01-13 DIAGNOSIS — S62655D Nondisplaced fracture of medial phalanx of left ring finger, subsequent encounter for fracture with routine healing: Secondary | ICD-10-CM | POA: Diagnosis not present

## 2020-01-13 DIAGNOSIS — Y999 Unspecified external cause status: Secondary | ICD-10-CM | POA: Diagnosis not present

## 2020-01-13 DIAGNOSIS — Z794 Long term (current) use of insulin: Secondary | ICD-10-CM | POA: Diagnosis not present

## 2020-01-13 DIAGNOSIS — S63601A Unspecified sprain of right thumb, initial encounter: Secondary | ICD-10-CM | POA: Diagnosis not present

## 2020-01-13 DIAGNOSIS — M19132 Post-traumatic osteoarthritis, left wrist: Secondary | ICD-10-CM | POA: Diagnosis not present

## 2020-01-13 DIAGNOSIS — E1122 Type 2 diabetes mellitus with diabetic chronic kidney disease: Secondary | ICD-10-CM | POA: Diagnosis not present

## 2020-01-13 DIAGNOSIS — S62665A Nondisplaced fracture of distal phalanx of left ring finger, initial encounter for closed fracture: Secondary | ICD-10-CM | POA: Diagnosis not present

## 2020-02-04 DIAGNOSIS — M79645 Pain in left finger(s): Secondary | ICD-10-CM | POA: Diagnosis not present

## 2020-02-04 DIAGNOSIS — S62655D Nondisplaced fracture of medial phalanx of left ring finger, subsequent encounter for fracture with routine healing: Secondary | ICD-10-CM | POA: Diagnosis not present

## 2020-02-04 DIAGNOSIS — Z8781 Personal history of (healed) traumatic fracture: Secondary | ICD-10-CM | POA: Diagnosis not present

## 2020-02-04 DIAGNOSIS — M19132 Post-traumatic osteoarthritis, left wrist: Secondary | ICD-10-CM | POA: Diagnosis not present

## 2020-02-05 DIAGNOSIS — S52512D Displaced fracture of left radial styloid process, subsequent encounter for closed fracture with routine healing: Secondary | ICD-10-CM | POA: Diagnosis not present

## 2020-02-10 DIAGNOSIS — M19032 Primary osteoarthritis, left wrist: Secondary | ICD-10-CM | POA: Diagnosis not present

## 2020-02-10 DIAGNOSIS — S62605D Fracture of unspecified phalanx of left ring finger, subsequent encounter for fracture with routine healing: Secondary | ICD-10-CM | POA: Diagnosis not present

## 2020-02-10 DIAGNOSIS — S52512A Displaced fracture of left radial styloid process, initial encounter for closed fracture: Secondary | ICD-10-CM | POA: Diagnosis not present

## 2020-02-10 DIAGNOSIS — M1812 Unilateral primary osteoarthritis of first carpometacarpal joint, left hand: Secondary | ICD-10-CM | POA: Diagnosis not present

## 2020-02-10 DIAGNOSIS — M85832 Other specified disorders of bone density and structure, left forearm: Secondary | ICD-10-CM | POA: Diagnosis not present

## 2020-02-10 DIAGNOSIS — S52512D Displaced fracture of left radial styloid process, subsequent encounter for closed fracture with routine healing: Secondary | ICD-10-CM | POA: Diagnosis not present

## 2020-02-10 DIAGNOSIS — S25512A Laceration of intercostal blood vessels, left side, initial encounter: Secondary | ICD-10-CM | POA: Diagnosis not present

## 2020-02-23 DIAGNOSIS — I959 Hypotension, unspecified: Secondary | ICD-10-CM | POA: Diagnosis not present

## 2020-02-23 DIAGNOSIS — R52 Pain, unspecified: Secondary | ICD-10-CM | POA: Diagnosis not present

## 2020-02-23 DIAGNOSIS — R0902 Hypoxemia: Secondary | ICD-10-CM | POA: Diagnosis not present

## 2020-02-23 DIAGNOSIS — R252 Cramp and spasm: Secondary | ICD-10-CM | POA: Diagnosis not present

## 2020-02-23 DIAGNOSIS — R4020 Unspecified coma: Secondary | ICD-10-CM | POA: Diagnosis not present

## 2020-03-16 DIAGNOSIS — N186 End stage renal disease: Secondary | ICD-10-CM | POA: Diagnosis not present

## 2020-03-16 DIAGNOSIS — Z992 Dependence on renal dialysis: Secondary | ICD-10-CM | POA: Diagnosis not present

## 2020-03-16 DIAGNOSIS — I779 Disorder of arteries and arterioles, unspecified: Secondary | ICD-10-CM | POA: Diagnosis not present

## 2020-03-16 DIAGNOSIS — Z95820 Peripheral vascular angioplasty status with implants and grafts: Secondary | ICD-10-CM | POA: Diagnosis not present

## 2020-03-24 DIAGNOSIS — Z6834 Body mass index (BMI) 34.0-34.9, adult: Secondary | ICD-10-CM | POA: Diagnosis not present

## 2020-03-24 DIAGNOSIS — Z794 Long term (current) use of insulin: Secondary | ICD-10-CM | POA: Diagnosis not present

## 2020-03-24 DIAGNOSIS — E669 Obesity, unspecified: Secondary | ICD-10-CM | POA: Diagnosis not present

## 2020-03-24 DIAGNOSIS — E1165 Type 2 diabetes mellitus with hyperglycemia: Secondary | ICD-10-CM | POA: Diagnosis not present

## 2020-03-31 DIAGNOSIS — Z01812 Encounter for preprocedural laboratory examination: Secondary | ICD-10-CM | POA: Diagnosis not present

## 2020-03-31 DIAGNOSIS — I779 Disorder of arteries and arterioles, unspecified: Secondary | ICD-10-CM | POA: Diagnosis not present

## 2020-03-31 DIAGNOSIS — Z20822 Contact with and (suspected) exposure to covid-19: Secondary | ICD-10-CM | POA: Diagnosis not present

## 2020-04-08 DIAGNOSIS — I447 Left bundle-branch block, unspecified: Secondary | ICD-10-CM | POA: Diagnosis not present

## 2020-04-08 DIAGNOSIS — I272 Pulmonary hypertension, unspecified: Secondary | ICD-10-CM | POA: Diagnosis not present

## 2020-04-08 DIAGNOSIS — I779 Disorder of arteries and arterioles, unspecified: Secondary | ICD-10-CM | POA: Diagnosis not present

## 2020-04-08 DIAGNOSIS — E162 Hypoglycemia, unspecified: Secondary | ICD-10-CM | POA: Diagnosis not present

## 2020-04-08 DIAGNOSIS — G4733 Obstructive sleep apnea (adult) (pediatric): Secondary | ICD-10-CM | POA: Diagnosis not present

## 2020-04-08 DIAGNOSIS — I129 Hypertensive chronic kidney disease with stage 1 through stage 4 chronic kidney disease, or unspecified chronic kidney disease: Secondary | ICD-10-CM | POA: Diagnosis not present

## 2020-04-08 DIAGNOSIS — L7634 Postprocedural seroma of skin and subcutaneous tissue following other procedure: Secondary | ICD-10-CM | POA: Diagnosis not present

## 2020-04-08 DIAGNOSIS — R52 Pain, unspecified: Secondary | ICD-10-CM | POA: Diagnosis not present

## 2020-04-08 DIAGNOSIS — E1122 Type 2 diabetes mellitus with diabetic chronic kidney disease: Secondary | ICD-10-CM | POA: Diagnosis not present

## 2020-04-08 DIAGNOSIS — E1151 Type 2 diabetes mellitus with diabetic peripheral angiopathy without gangrene: Secondary | ICD-10-CM | POA: Diagnosis not present

## 2020-04-08 DIAGNOSIS — I440 Atrioventricular block, first degree: Secondary | ICD-10-CM | POA: Diagnosis not present

## 2020-04-08 DIAGNOSIS — L97525 Non-pressure chronic ulcer of other part of left foot with muscle involvement without evidence of necrosis: Secondary | ICD-10-CM | POA: Diagnosis not present

## 2020-04-08 DIAGNOSIS — J45909 Unspecified asthma, uncomplicated: Secondary | ICD-10-CM | POA: Diagnosis not present

## 2020-04-08 DIAGNOSIS — R55 Syncope and collapse: Secondary | ICD-10-CM | POA: Diagnosis not present

## 2020-04-08 DIAGNOSIS — N186 End stage renal disease: Secondary | ICD-10-CM | POA: Diagnosis not present

## 2020-04-08 DIAGNOSIS — Z87891 Personal history of nicotine dependence: Secondary | ICD-10-CM | POA: Diagnosis not present

## 2020-04-08 DIAGNOSIS — R4020 Unspecified coma: Secondary | ICD-10-CM | POA: Diagnosis not present

## 2020-04-08 DIAGNOSIS — Z794 Long term (current) use of insulin: Secondary | ICD-10-CM | POA: Diagnosis not present

## 2020-04-08 DIAGNOSIS — E161 Other hypoglycemia: Secondary | ICD-10-CM | POA: Diagnosis not present

## 2020-04-08 DIAGNOSIS — R404 Transient alteration of awareness: Secondary | ICD-10-CM | POA: Diagnosis not present

## 2020-04-08 DIAGNOSIS — R918 Other nonspecific abnormal finding of lung field: Secondary | ICD-10-CM | POA: Diagnosis not present

## 2020-04-08 DIAGNOSIS — E669 Obesity, unspecified: Secondary | ICD-10-CM | POA: Diagnosis not present

## 2020-04-08 DIAGNOSIS — I120 Hypertensive chronic kidney disease with stage 5 chronic kidney disease or end stage renal disease: Secondary | ICD-10-CM | POA: Diagnosis not present

## 2020-04-08 DIAGNOSIS — R9431 Abnormal electrocardiogram [ECG] [EKG]: Secondary | ICD-10-CM | POA: Diagnosis not present

## 2020-04-08 DIAGNOSIS — Z7982 Long term (current) use of aspirin: Secondary | ICD-10-CM | POA: Diagnosis not present

## 2020-04-08 DIAGNOSIS — Z79899 Other long term (current) drug therapy: Secondary | ICD-10-CM | POA: Diagnosis not present

## 2020-04-08 DIAGNOSIS — Z992 Dependence on renal dialysis: Secondary | ICD-10-CM | POA: Diagnosis not present

## 2020-04-08 DIAGNOSIS — Z95828 Presence of other vascular implants and grafts: Secondary | ICD-10-CM | POA: Diagnosis not present

## 2020-04-08 DIAGNOSIS — E11621 Type 2 diabetes mellitus with foot ulcer: Secondary | ICD-10-CM | POA: Diagnosis not present

## 2020-04-08 DIAGNOSIS — E785 Hyperlipidemia, unspecified: Secondary | ICD-10-CM | POA: Diagnosis not present

## 2020-04-08 DIAGNOSIS — Z7901 Long term (current) use of anticoagulants: Secondary | ICD-10-CM | POA: Diagnosis not present

## 2020-04-08 DIAGNOSIS — I509 Heart failure, unspecified: Secondary | ICD-10-CM | POA: Diagnosis not present

## 2020-04-11 DIAGNOSIS — S52512D Displaced fracture of left radial styloid process, subsequent encounter for closed fracture with routine healing: Secondary | ICD-10-CM | POA: Diagnosis not present

## 2020-04-15 DIAGNOSIS — N186 End stage renal disease: Secondary | ICD-10-CM | POA: Diagnosis not present

## 2020-04-15 DIAGNOSIS — Z7982 Long term (current) use of aspirin: Secondary | ICD-10-CM | POA: Diagnosis not present

## 2020-04-15 DIAGNOSIS — N2581 Secondary hyperparathyroidism of renal origin: Secondary | ICD-10-CM | POA: Diagnosis not present

## 2020-04-15 DIAGNOSIS — E872 Acidosis: Secondary | ICD-10-CM | POA: Diagnosis not present

## 2020-04-15 DIAGNOSIS — E11628 Type 2 diabetes mellitus with other skin complications: Secondary | ICD-10-CM | POA: Diagnosis not present

## 2020-04-15 DIAGNOSIS — S8990XA Unspecified injury of unspecified lower leg, initial encounter: Secondary | ICD-10-CM | POA: Diagnosis not present

## 2020-04-15 DIAGNOSIS — I120 Hypertensive chronic kidney disease with stage 5 chronic kidney disease or end stage renal disease: Secondary | ICD-10-CM | POA: Diagnosis not present

## 2020-04-15 DIAGNOSIS — Z992 Dependence on renal dialysis: Secondary | ICD-10-CM | POA: Diagnosis not present

## 2020-04-15 DIAGNOSIS — L03116 Cellulitis of left lower limb: Secondary | ICD-10-CM | POA: Diagnosis not present

## 2020-04-15 DIAGNOSIS — I440 Atrioventricular block, first degree: Secondary | ICD-10-CM | POA: Diagnosis not present

## 2020-04-15 DIAGNOSIS — D631 Anemia in chronic kidney disease: Secondary | ICD-10-CM | POA: Diagnosis not present

## 2020-04-15 DIAGNOSIS — E1122 Type 2 diabetes mellitus with diabetic chronic kidney disease: Secondary | ICD-10-CM | POA: Diagnosis not present

## 2020-04-15 DIAGNOSIS — S81802A Unspecified open wound, left lower leg, initial encounter: Secondary | ICD-10-CM | POA: Diagnosis not present

## 2020-04-15 DIAGNOSIS — Z794 Long term (current) use of insulin: Secondary | ICD-10-CM | POA: Diagnosis not present

## 2020-04-15 DIAGNOSIS — Z7901 Long term (current) use of anticoagulants: Secondary | ICD-10-CM | POA: Diagnosis not present

## 2020-04-15 DIAGNOSIS — Z79899 Other long term (current) drug therapy: Secondary | ICD-10-CM | POA: Diagnosis not present

## 2020-04-16 DIAGNOSIS — Z79899 Other long term (current) drug therapy: Secondary | ICD-10-CM | POA: Diagnosis not present

## 2020-04-16 DIAGNOSIS — N2581 Secondary hyperparathyroidism of renal origin: Secondary | ICD-10-CM | POA: Diagnosis not present

## 2020-04-16 DIAGNOSIS — D631 Anemia in chronic kidney disease: Secondary | ICD-10-CM | POA: Diagnosis not present

## 2020-04-16 DIAGNOSIS — Z992 Dependence on renal dialysis: Secondary | ICD-10-CM | POA: Diagnosis not present

## 2020-04-16 DIAGNOSIS — Z794 Long term (current) use of insulin: Secondary | ICD-10-CM | POA: Diagnosis not present

## 2020-04-16 DIAGNOSIS — E1122 Type 2 diabetes mellitus with diabetic chronic kidney disease: Secondary | ICD-10-CM | POA: Diagnosis not present

## 2020-04-16 DIAGNOSIS — N186 End stage renal disease: Secondary | ICD-10-CM | POA: Diagnosis not present

## 2020-04-16 DIAGNOSIS — I120 Hypertensive chronic kidney disease with stage 5 chronic kidney disease or end stage renal disease: Secondary | ICD-10-CM | POA: Diagnosis not present

## 2020-04-16 DIAGNOSIS — E872 Acidosis: Secondary | ICD-10-CM | POA: Diagnosis not present

## 2020-05-16 DIAGNOSIS — Z992 Dependence on renal dialysis: Secondary | ICD-10-CM | POA: Diagnosis not present

## 2020-05-16 DIAGNOSIS — N186 End stage renal disease: Secondary | ICD-10-CM | POA: Diagnosis not present

## 2020-06-15 DIAGNOSIS — N186 End stage renal disease: Secondary | ICD-10-CM | POA: Diagnosis not present

## 2020-06-15 DIAGNOSIS — Z992 Dependence on renal dialysis: Secondary | ICD-10-CM | POA: Diagnosis not present

## 2020-06-30 ENCOUNTER — Ambulatory Visit: Payer: Medicare Other | Admitting: Cardiovascular Disease

## 2020-07-02 DIAGNOSIS — R0689 Other abnormalities of breathing: Secondary | ICD-10-CM | POA: Diagnosis not present

## 2020-07-02 DIAGNOSIS — M868X6 Other osteomyelitis, lower leg: Secondary | ICD-10-CM | POA: Diagnosis present

## 2020-07-02 DIAGNOSIS — Z89512 Acquired absence of left leg below knee: Secondary | ICD-10-CM | POA: Diagnosis not present

## 2020-07-02 DIAGNOSIS — Z794 Long term (current) use of insulin: Secondary | ICD-10-CM | POA: Diagnosis not present

## 2020-07-02 DIAGNOSIS — I10 Essential (primary) hypertension: Secondary | ICD-10-CM | POA: Diagnosis not present

## 2020-07-02 DIAGNOSIS — I70362 Atherosclerosis of unspecified type of bypass graft(s) of the extremities with gangrene, left leg: Secondary | ICD-10-CM | POA: Diagnosis present

## 2020-07-02 DIAGNOSIS — D631 Anemia in chronic kidney disease: Secondary | ICD-10-CM | POA: Diagnosis not present

## 2020-07-02 DIAGNOSIS — I358 Other nonrheumatic aortic valve disorders: Secondary | ICD-10-CM | POA: Diagnosis not present

## 2020-07-02 DIAGNOSIS — I96 Gangrene, not elsewhere classified: Secondary | ICD-10-CM | POA: Diagnosis not present

## 2020-07-02 DIAGNOSIS — G40409 Other generalized epilepsy and epileptic syndromes, not intractable, without status epilepticus: Secondary | ICD-10-CM | POA: Diagnosis not present

## 2020-07-02 DIAGNOSIS — L97529 Non-pressure chronic ulcer of other part of left foot with unspecified severity: Secondary | ICD-10-CM | POA: Diagnosis not present

## 2020-07-02 DIAGNOSIS — I2510 Atherosclerotic heart disease of native coronary artery without angina pectoris: Secondary | ICD-10-CM | POA: Diagnosis not present

## 2020-07-02 DIAGNOSIS — E083513 Diabetes mellitus due to underlying condition with proliferative diabetic retinopathy with macular edema, bilateral: Secondary | ICD-10-CM | POA: Diagnosis not present

## 2020-07-02 DIAGNOSIS — Z481 Encounter for planned postprocedural wound closure: Secondary | ICD-10-CM | POA: Diagnosis not present

## 2020-07-02 DIAGNOSIS — I771 Stricture of artery: Secondary | ICD-10-CM | POA: Diagnosis not present

## 2020-07-02 DIAGNOSIS — E1101 Type 2 diabetes mellitus with hyperosmolarity with coma: Secondary | ICD-10-CM | POA: Diagnosis not present

## 2020-07-02 DIAGNOSIS — A419 Sepsis, unspecified organism: Secondary | ICD-10-CM | POA: Diagnosis not present

## 2020-07-02 DIAGNOSIS — J9811 Atelectasis: Secondary | ICD-10-CM | POA: Diagnosis not present

## 2020-07-02 DIAGNOSIS — L97429 Non-pressure chronic ulcer of left heel and midfoot with unspecified severity: Secondary | ICD-10-CM | POA: Diagnosis present

## 2020-07-02 DIAGNOSIS — D62 Acute posthemorrhagic anemia: Secondary | ICD-10-CM | POA: Diagnosis not present

## 2020-07-02 DIAGNOSIS — I959 Hypotension, unspecified: Secondary | ICD-10-CM | POA: Diagnosis not present

## 2020-07-02 DIAGNOSIS — I5022 Chronic systolic (congestive) heart failure: Secondary | ICD-10-CM | POA: Diagnosis not present

## 2020-07-02 DIAGNOSIS — G92 Toxic encephalopathy: Secondary | ICD-10-CM | POA: Diagnosis not present

## 2020-07-02 DIAGNOSIS — E1169 Type 2 diabetes mellitus with other specified complication: Secondary | ICD-10-CM | POA: Diagnosis not present

## 2020-07-02 DIAGNOSIS — I70262 Atherosclerosis of native arteries of extremities with gangrene, left leg: Secondary | ICD-10-CM | POA: Diagnosis not present

## 2020-07-02 DIAGNOSIS — I509 Heart failure, unspecified: Secondary | ICD-10-CM | POA: Diagnosis not present

## 2020-07-02 DIAGNOSIS — E0801 Diabetes mellitus due to underlying condition with hyperosmolarity with coma: Secondary | ICD-10-CM | POA: Diagnosis not present

## 2020-07-02 DIAGNOSIS — Z7409 Other reduced mobility: Secondary | ICD-10-CM | POA: Diagnosis not present

## 2020-07-02 DIAGNOSIS — I739 Peripheral vascular disease, unspecified: Secondary | ICD-10-CM | POA: Diagnosis not present

## 2020-07-02 DIAGNOSIS — I70203 Unspecified atherosclerosis of native arteries of extremities, bilateral legs: Secondary | ICD-10-CM | POA: Diagnosis not present

## 2020-07-02 DIAGNOSIS — R531 Weakness: Secondary | ICD-10-CM | POA: Diagnosis not present

## 2020-07-02 DIAGNOSIS — R0603 Acute respiratory distress: Secondary | ICD-10-CM | POA: Diagnosis not present

## 2020-07-02 DIAGNOSIS — L03116 Cellulitis of left lower limb: Secondary | ICD-10-CM | POA: Diagnosis present

## 2020-07-02 DIAGNOSIS — R001 Bradycardia, unspecified: Secondary | ICD-10-CM | POA: Diagnosis not present

## 2020-07-02 DIAGNOSIS — T8119XA Other postprocedural shock, initial encounter: Secondary | ICD-10-CM | POA: Diagnosis not present

## 2020-07-02 DIAGNOSIS — Z9911 Dependence on respirator [ventilator] status: Secondary | ICD-10-CM | POA: Diagnosis not present

## 2020-07-02 DIAGNOSIS — I70245 Atherosclerosis of native arteries of left leg with ulceration of other part of foot: Secondary | ICD-10-CM | POA: Diagnosis not present

## 2020-07-02 DIAGNOSIS — E1165 Type 2 diabetes mellitus with hyperglycemia: Secondary | ICD-10-CM | POA: Diagnosis not present

## 2020-07-02 DIAGNOSIS — G40919 Epilepsy, unspecified, intractable, without status epilepticus: Secondary | ICD-10-CM | POA: Diagnosis not present

## 2020-07-02 DIAGNOSIS — I152 Hypertension secondary to endocrine disorders: Secondary | ICD-10-CM | POA: Diagnosis not present

## 2020-07-02 DIAGNOSIS — E11649 Type 2 diabetes mellitus with hypoglycemia without coma: Secondary | ICD-10-CM | POA: Diagnosis not present

## 2020-07-02 DIAGNOSIS — E1152 Type 2 diabetes mellitus with diabetic peripheral angiopathy with gangrene: Secondary | ICD-10-CM | POA: Diagnosis present

## 2020-07-02 DIAGNOSIS — T380X5A Adverse effect of glucocorticoids and synthetic analogues, initial encounter: Secondary | ICD-10-CM | POA: Diagnosis not present

## 2020-07-02 DIAGNOSIS — K529 Noninfective gastroenteritis and colitis, unspecified: Secondary | ICD-10-CM | POA: Diagnosis not present

## 2020-07-02 DIAGNOSIS — R918 Other nonspecific abnormal finding of lung field: Secondary | ICD-10-CM | POA: Diagnosis not present

## 2020-07-02 DIAGNOSIS — G8918 Other acute postprocedural pain: Secondary | ICD-10-CM | POA: Diagnosis not present

## 2020-07-02 DIAGNOSIS — N2581 Secondary hyperparathyroidism of renal origin: Secondary | ICD-10-CM | POA: Diagnosis present

## 2020-07-02 DIAGNOSIS — T8744 Infection of amputation stump, left lower extremity: Secondary | ICD-10-CM | POA: Diagnosis not present

## 2020-07-02 DIAGNOSIS — I5032 Chronic diastolic (congestive) heart failure: Secondary | ICD-10-CM | POA: Diagnosis not present

## 2020-07-02 DIAGNOSIS — R569 Unspecified convulsions: Secondary | ICD-10-CM | POA: Diagnosis not present

## 2020-07-02 DIAGNOSIS — N186 End stage renal disease: Secondary | ICD-10-CM | POA: Diagnosis not present

## 2020-07-02 DIAGNOSIS — I9589 Other hypotension: Secondary | ICD-10-CM | POA: Diagnosis not present

## 2020-07-02 DIAGNOSIS — Z89519 Acquired absence of unspecified leg below knee: Secondary | ICD-10-CM | POA: Diagnosis not present

## 2020-07-02 DIAGNOSIS — L03119 Cellulitis of unspecified part of limb: Secondary | ICD-10-CM | POA: Diagnosis not present

## 2020-07-02 DIAGNOSIS — I5020 Unspecified systolic (congestive) heart failure: Secondary | ICD-10-CM | POA: Diagnosis present

## 2020-07-02 DIAGNOSIS — E11628 Type 2 diabetes mellitus with other skin complications: Secondary | ICD-10-CM | POA: Diagnosis not present

## 2020-07-02 DIAGNOSIS — E43 Unspecified severe protein-calorie malnutrition: Secondary | ICD-10-CM | POA: Diagnosis not present

## 2020-07-02 DIAGNOSIS — N185 Chronic kidney disease, stage 5: Secondary | ICD-10-CM | POA: Diagnosis not present

## 2020-07-02 DIAGNOSIS — E872 Acidosis: Secondary | ICD-10-CM | POA: Diagnosis not present

## 2020-07-02 DIAGNOSIS — L97829 Non-pressure chronic ulcer of other part of left lower leg with unspecified severity: Secondary | ICD-10-CM | POA: Diagnosis not present

## 2020-07-02 DIAGNOSIS — Z89511 Acquired absence of right leg below knee: Secondary | ICD-10-CM | POA: Diagnosis not present

## 2020-07-02 DIAGNOSIS — Z789 Other specified health status: Secondary | ICD-10-CM | POA: Diagnosis not present

## 2020-07-02 DIAGNOSIS — R571 Hypovolemic shock: Secondary | ICD-10-CM | POA: Diagnosis not present

## 2020-07-02 DIAGNOSIS — T361X5A Adverse effect of cephalosporins and other beta-lactam antibiotics, initial encounter: Secondary | ICD-10-CM | POA: Diagnosis not present

## 2020-07-02 DIAGNOSIS — I132 Hypertensive heart and chronic kidney disease with heart failure and with stage 5 chronic kidney disease, or end stage renal disease: Secondary | ICD-10-CM | POA: Diagnosis not present

## 2020-07-02 DIAGNOSIS — E211 Secondary hyperparathyroidism, not elsewhere classified: Secondary | ICD-10-CM | POA: Diagnosis not present

## 2020-07-02 DIAGNOSIS — Z20822 Contact with and (suspected) exposure to covid-19: Secondary | ICD-10-CM | POA: Diagnosis present

## 2020-07-02 DIAGNOSIS — E118 Type 2 diabetes mellitus with unspecified complications: Secondary | ICD-10-CM | POA: Diagnosis not present

## 2020-07-02 DIAGNOSIS — R739 Hyperglycemia, unspecified: Secondary | ICD-10-CM | POA: Diagnosis not present

## 2020-07-02 DIAGNOSIS — J9601 Acute respiratory failure with hypoxia: Secondary | ICD-10-CM | POA: Diagnosis not present

## 2020-07-02 DIAGNOSIS — E119 Type 2 diabetes mellitus without complications: Secondary | ICD-10-CM | POA: Diagnosis not present

## 2020-07-02 DIAGNOSIS — J90 Pleural effusion, not elsewhere classified: Secondary | ICD-10-CM | POA: Diagnosis not present

## 2020-07-02 DIAGNOSIS — E1122 Type 2 diabetes mellitus with diabetic chronic kidney disease: Secondary | ICD-10-CM | POA: Diagnosis not present

## 2020-07-02 DIAGNOSIS — E1159 Type 2 diabetes mellitus with other circulatory complications: Secondary | ICD-10-CM | POA: Diagnosis not present

## 2020-07-02 DIAGNOSIS — Z992 Dependence on renal dialysis: Secondary | ICD-10-CM | POA: Diagnosis not present

## 2020-07-02 DIAGNOSIS — R1312 Dysphagia, oropharyngeal phase: Secondary | ICD-10-CM | POA: Diagnosis not present

## 2020-07-12 ENCOUNTER — Ambulatory Visit: Payer: Medicare Other | Admitting: Cardiovascular Disease

## 2020-07-17 IMAGING — DX DG CHEST 2V
2 series · 2 of 2 positions shown · non-contrast
Comparison: 12/16/2010

CLINICAL DATA: Sepsis.  Right lower quadrant pain.

EXAM:
CHEST - 2 VIEW

[chest ap]
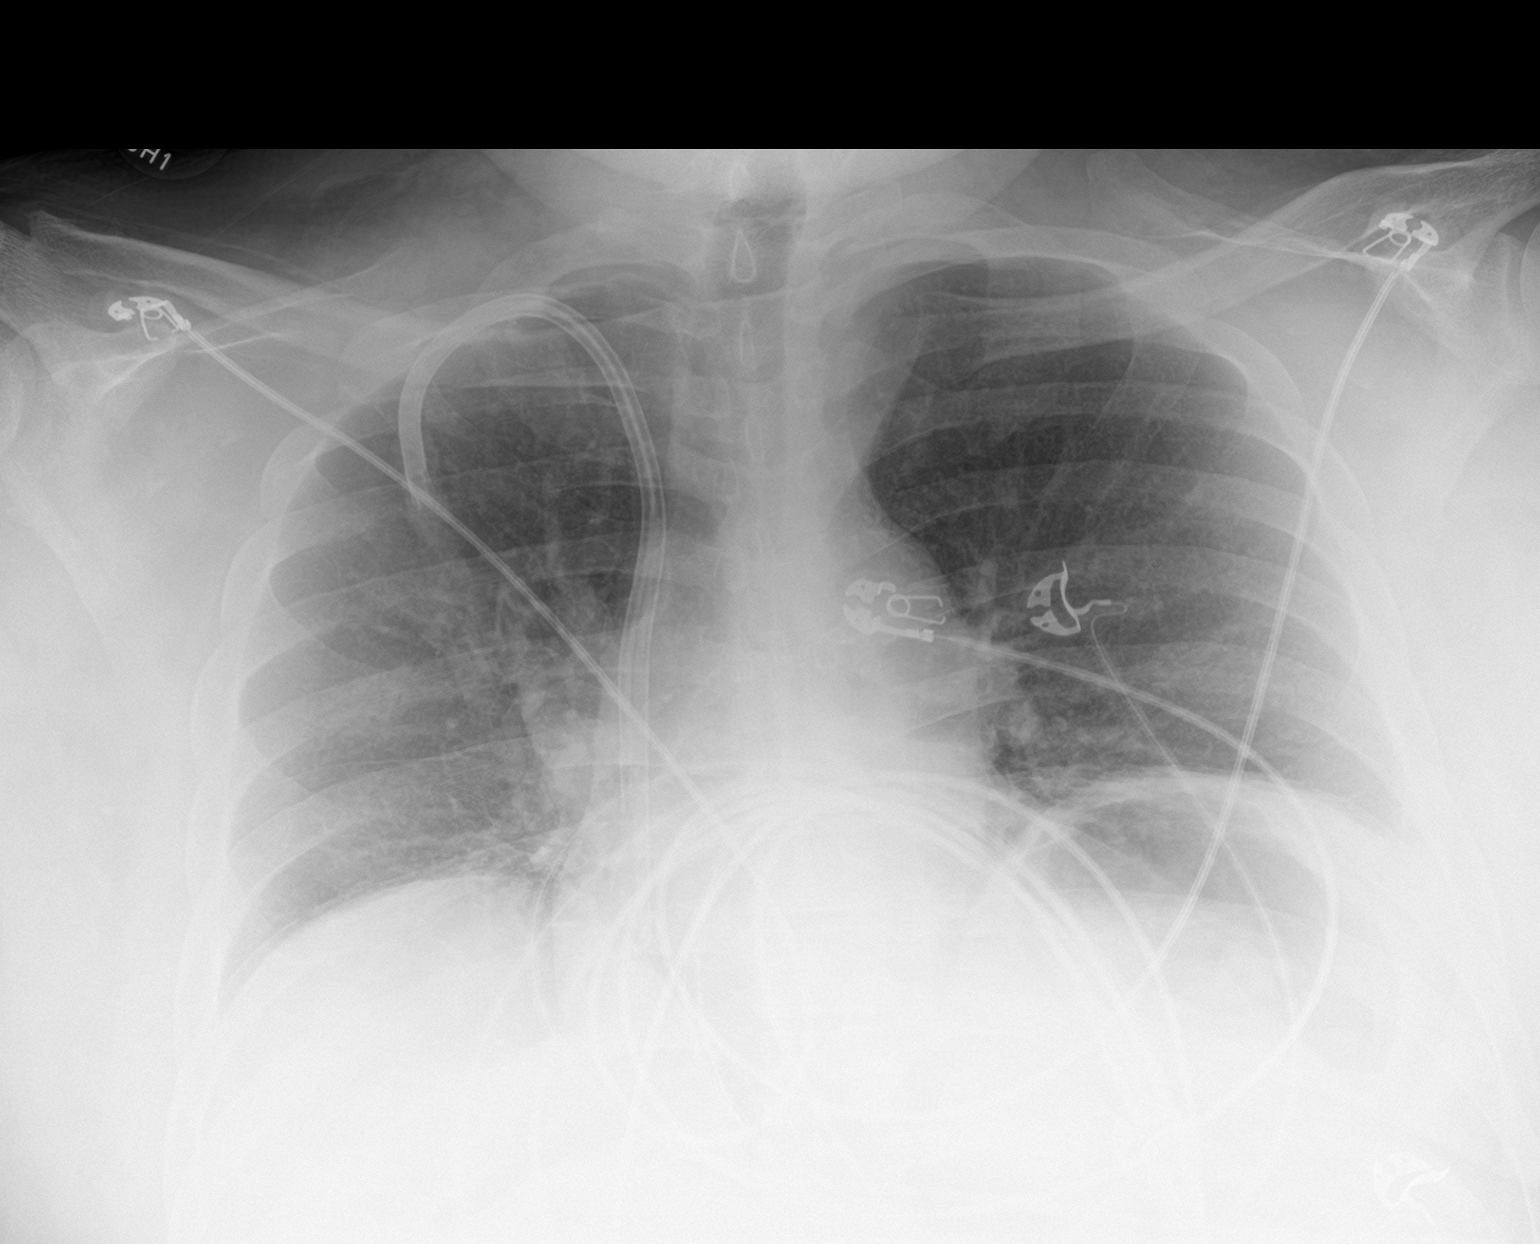

[chest lat]
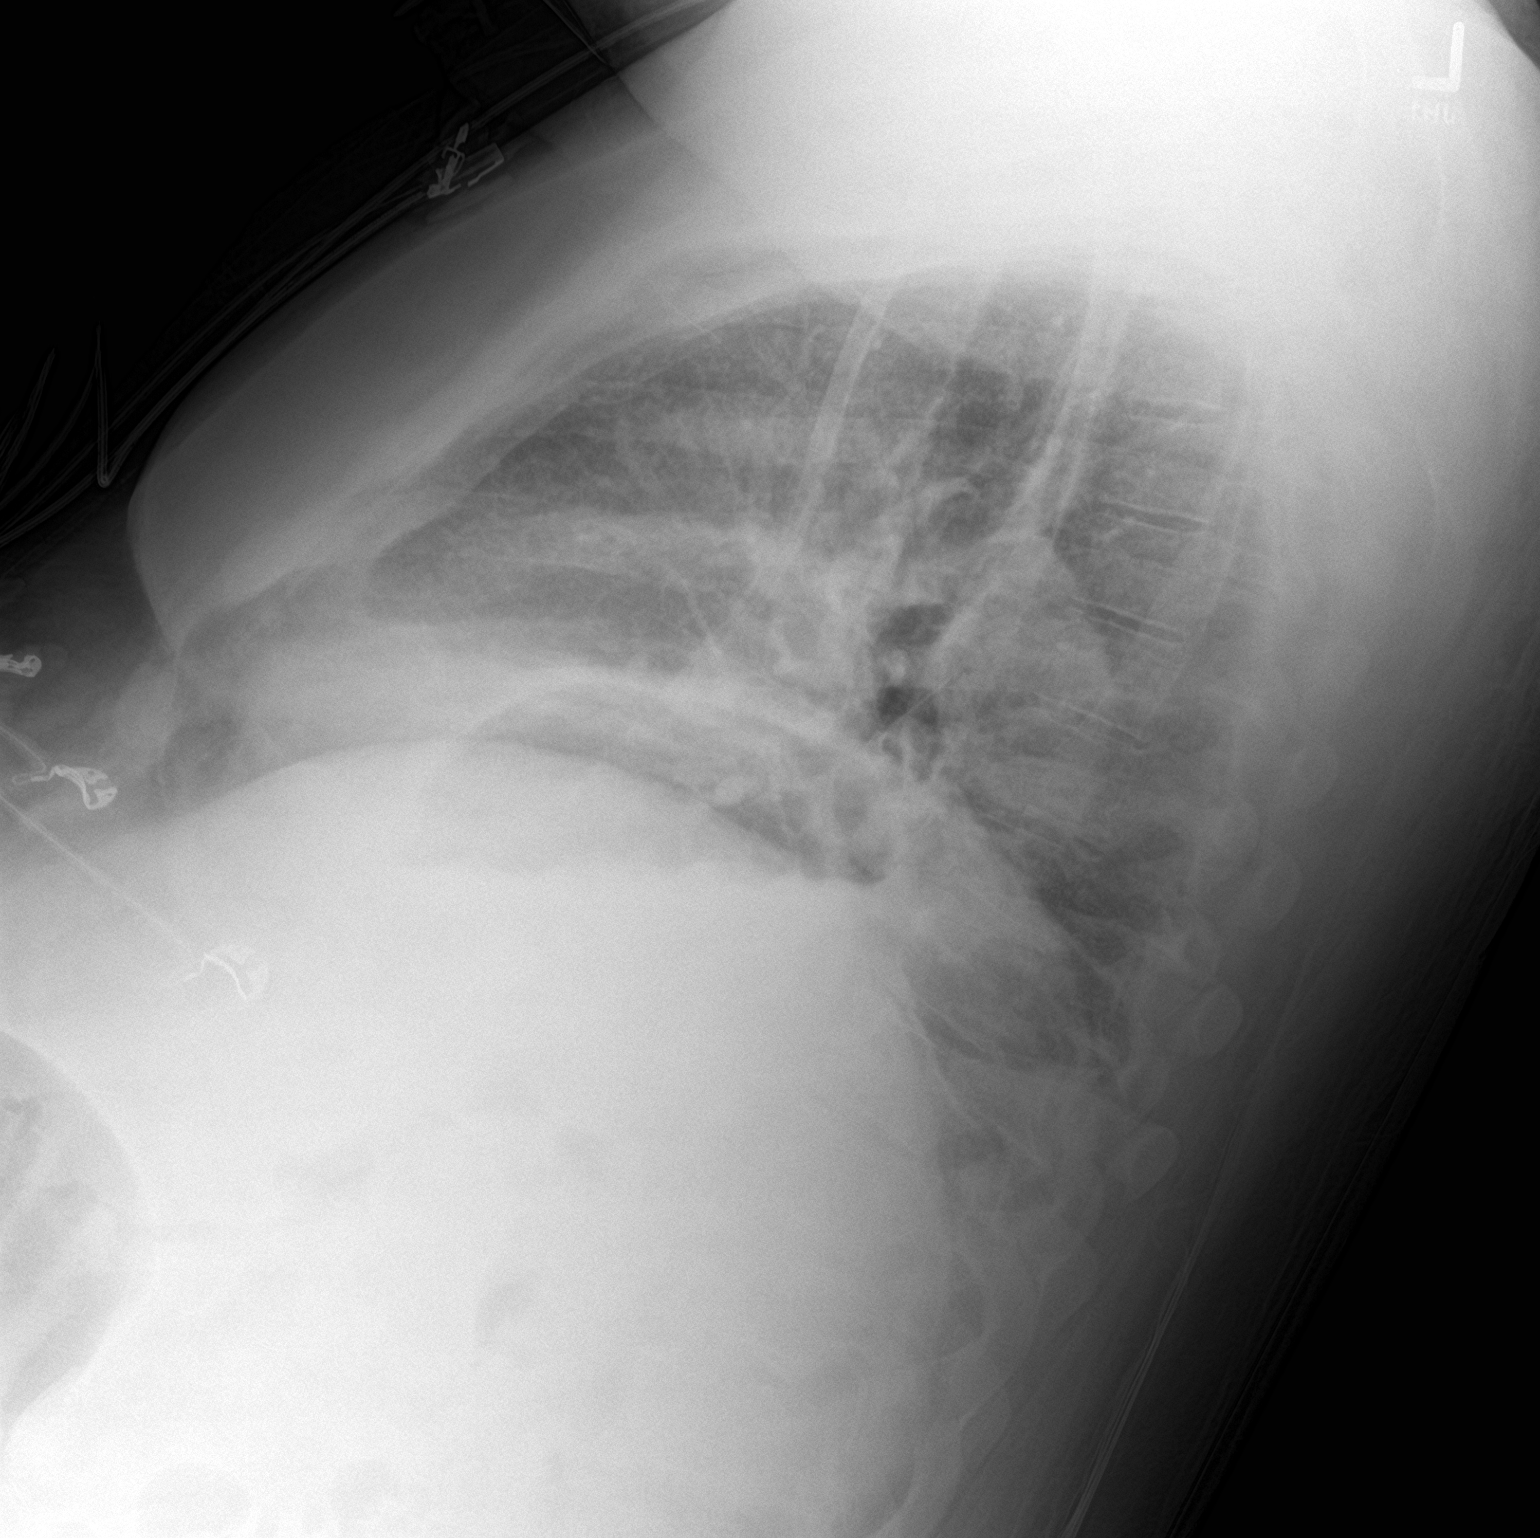

[2 of 2 positions shown; findings below may reference images not displayed]

FINDINGS: There is a right jugular dialysis catheter with the tip extending
into the right atrium. Low lung volumes. Left basilar densities are
most compatible with atelectasis/scar and may be chronic. Cardiac
silhouette appears to be within normal limits and stable but poorly
characterized on this low lung volume examination. Negative for
pneumothorax. No significant airspace disease or pulmonary edema. No
large pleural effusions.
IMPRESSION: Low lung volumes without acute findings.

Presence of a dialysis catheter.

## 2020-07-19 IMAGING — DX DG ABDOMEN 2V
3 series · 3 of 3 positions shown · non-contrast
Comparison: None.

CLINICAL DATA: Abdominal pain.  LEFT lower quadrant pain.

EXAM:
ABDOMEN - 2 VIEW

[abdomen erect]
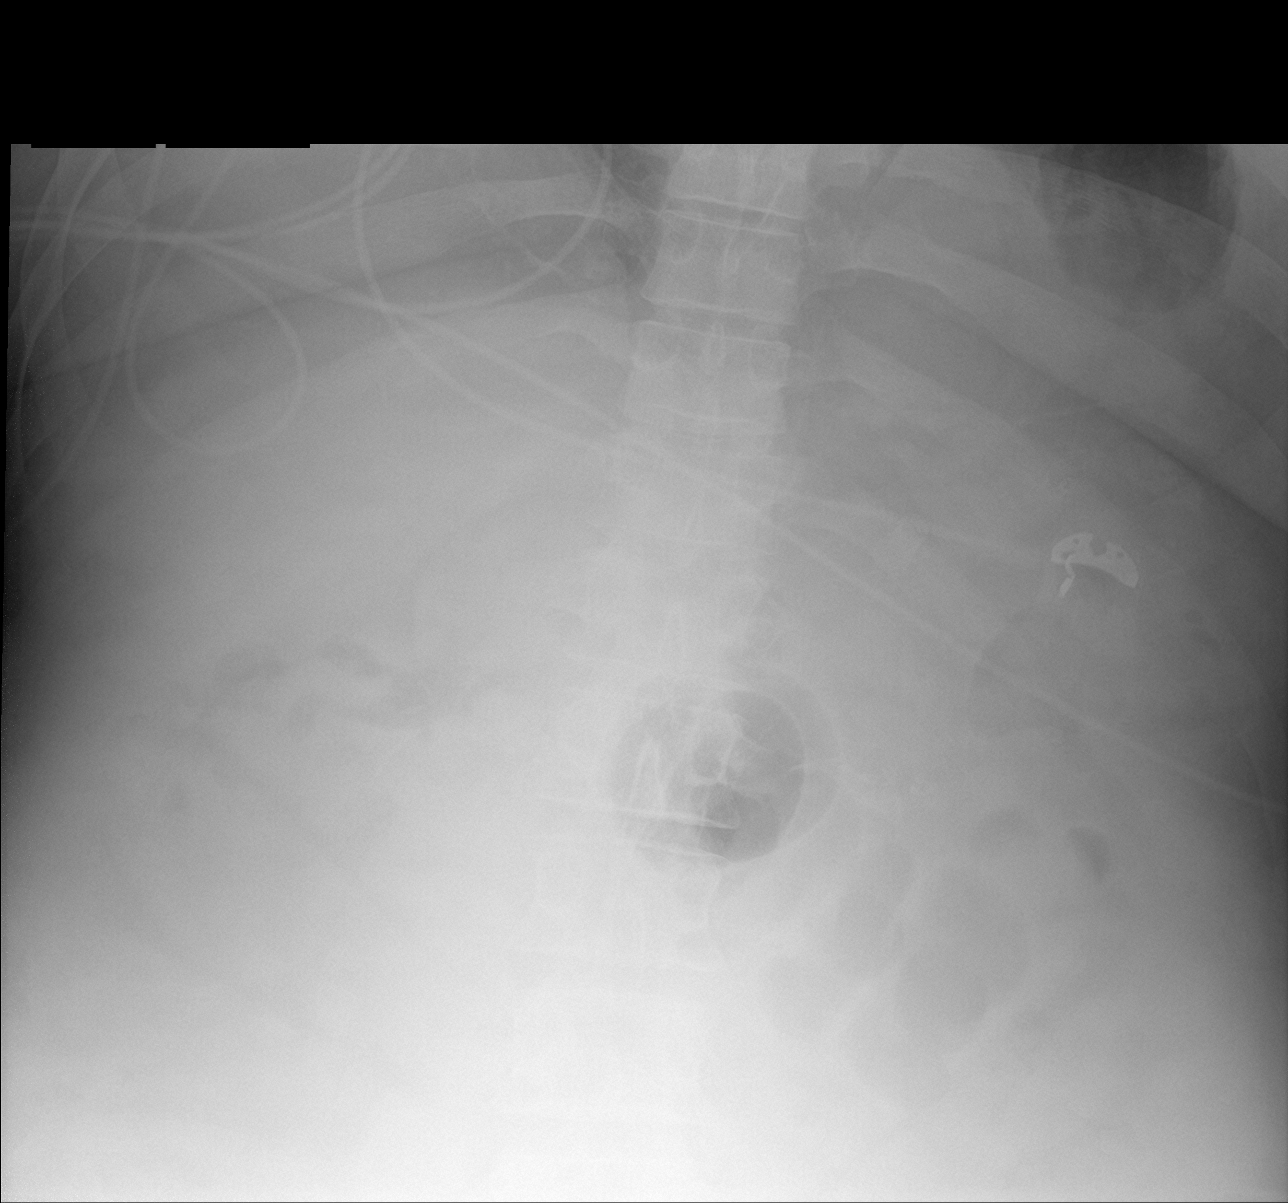

[abdomen supine (1 of 2)]
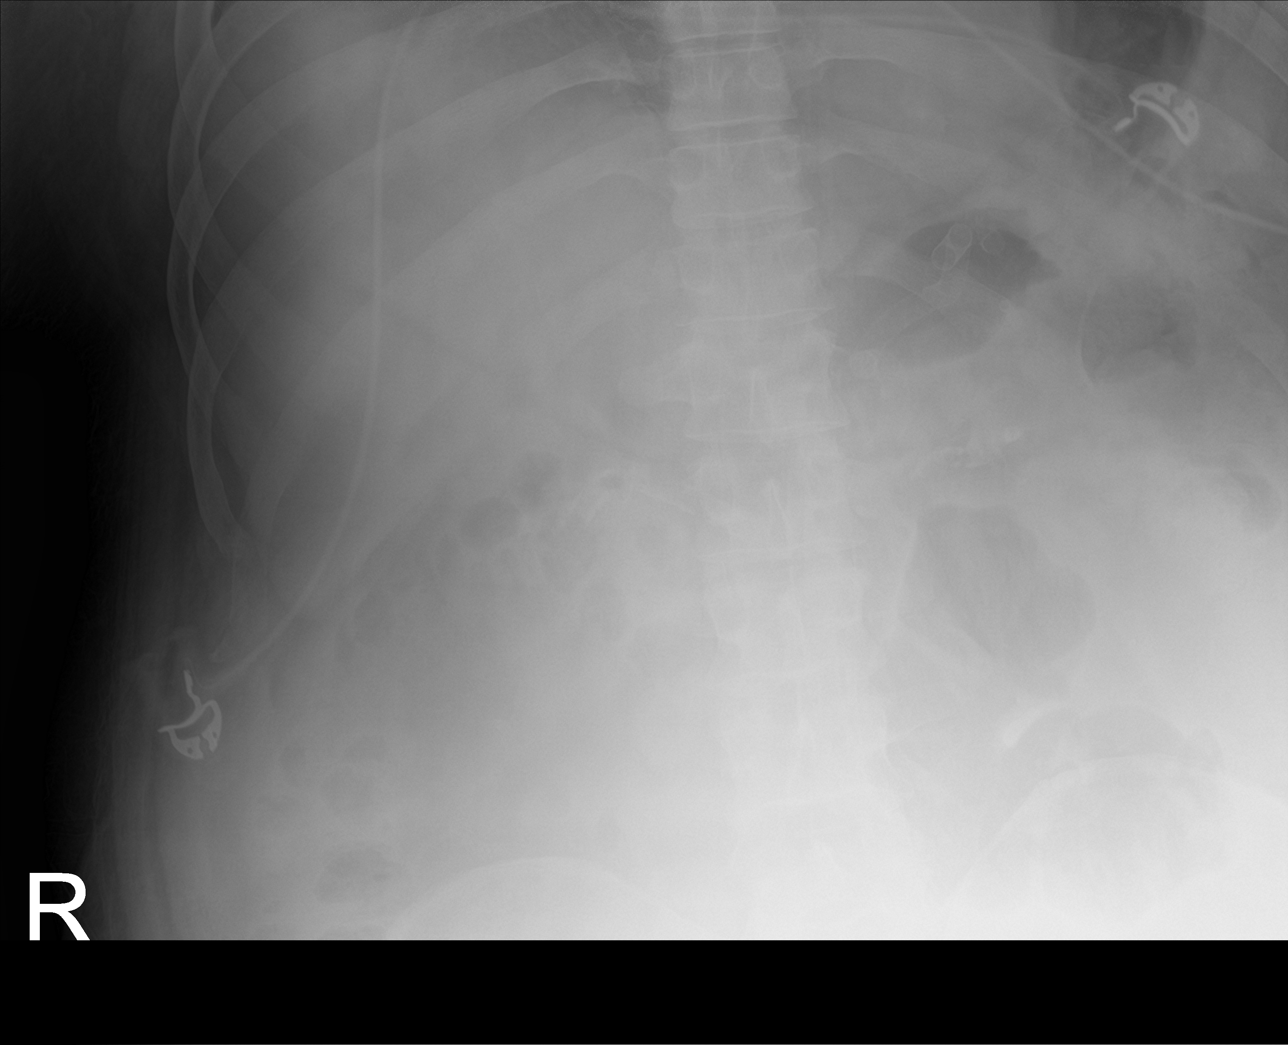

[abdomen supine (2 of 2)]
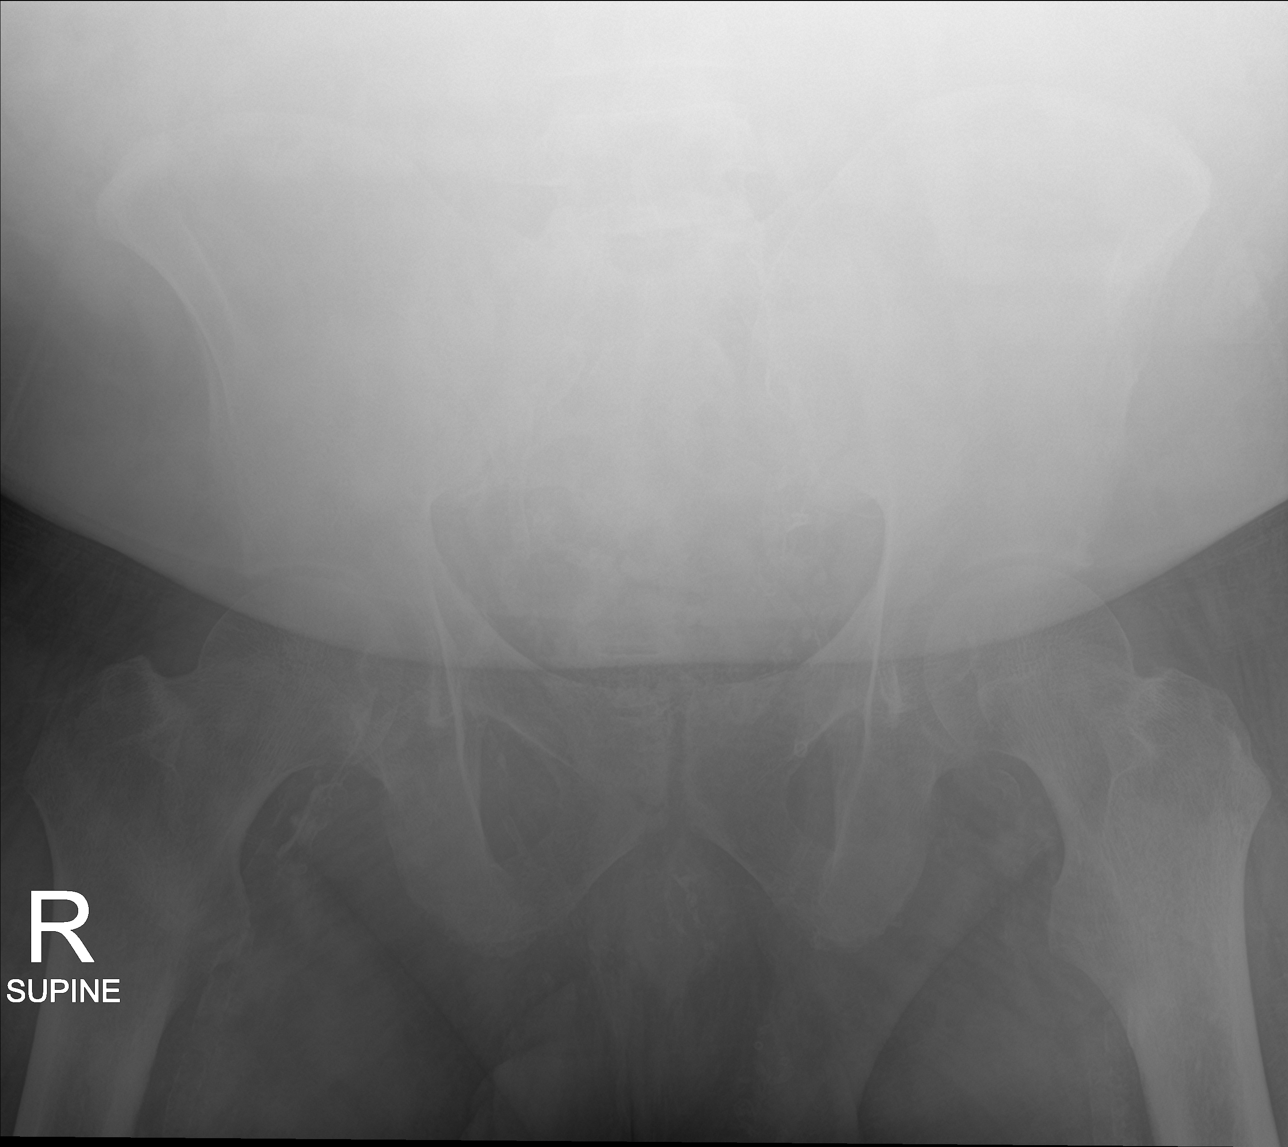

[3 of 3 positions shown; findings below may reference images not displayed]

FINDINGS: Suboptimal images due to body habitus. The bowel gas pattern is
normal. There is no evidence of free air. No radio-opaque calculi or
other significant radiographic abnormality is seen.
IMPRESSION: Grossly negative flat and upright abdominal radiographs.

## 2020-08-05 DIAGNOSIS — I959 Hypotension, unspecified: Secondary | ICD-10-CM | POA: Diagnosis not present

## 2020-08-05 DIAGNOSIS — N186 End stage renal disease: Secondary | ICD-10-CM | POA: Diagnosis not present

## 2020-08-05 DIAGNOSIS — G8929 Other chronic pain: Secondary | ICD-10-CM | POA: Diagnosis not present

## 2020-08-05 DIAGNOSIS — J9811 Atelectasis: Secondary | ICD-10-CM | POA: Diagnosis not present

## 2020-08-05 DIAGNOSIS — S88112A Complete traumatic amputation at level between knee and ankle, left lower leg, initial encounter: Secondary | ICD-10-CM | POA: Diagnosis not present

## 2020-08-05 DIAGNOSIS — I96 Gangrene, not elsewhere classified: Secondary | ICD-10-CM | POA: Diagnosis not present

## 2020-08-05 DIAGNOSIS — S88112D Complete traumatic amputation at level between knee and ankle, left lower leg, subsequent encounter: Secondary | ICD-10-CM | POA: Diagnosis not present

## 2020-08-05 DIAGNOSIS — Z4781 Encounter for orthopedic aftercare following surgical amputation: Secondary | ICD-10-CM | POA: Diagnosis not present

## 2020-08-05 DIAGNOSIS — Z89512 Acquired absence of left leg below knee: Secondary | ICD-10-CM | POA: Diagnosis not present

## 2020-08-05 DIAGNOSIS — I4891 Unspecified atrial fibrillation: Secondary | ICD-10-CM | POA: Diagnosis not present

## 2020-08-05 DIAGNOSIS — L309 Dermatitis, unspecified: Secondary | ICD-10-CM | POA: Diagnosis not present

## 2020-08-05 DIAGNOSIS — R1310 Dysphagia, unspecified: Secondary | ICD-10-CM | POA: Diagnosis not present

## 2020-08-05 DIAGNOSIS — E1165 Type 2 diabetes mellitus with hyperglycemia: Secondary | ICD-10-CM | POA: Diagnosis not present

## 2020-08-05 DIAGNOSIS — E11628 Type 2 diabetes mellitus with other skin complications: Secondary | ICD-10-CM | POA: Diagnosis not present

## 2020-08-05 DIAGNOSIS — J9600 Acute respiratory failure, unspecified whether with hypoxia or hypercapnia: Secondary | ICD-10-CM | POA: Diagnosis not present

## 2020-08-05 DIAGNOSIS — I120 Hypertensive chronic kidney disease with stage 5 chronic kidney disease or end stage renal disease: Secondary | ICD-10-CM | POA: Diagnosis not present

## 2020-08-05 DIAGNOSIS — Z89511 Acquired absence of right leg below knee: Secondary | ICD-10-CM | POA: Diagnosis not present

## 2020-08-05 DIAGNOSIS — E7800 Pure hypercholesterolemia, unspecified: Secondary | ICD-10-CM | POA: Diagnosis not present

## 2020-08-05 DIAGNOSIS — G629 Polyneuropathy, unspecified: Secondary | ICD-10-CM | POA: Diagnosis not present

## 2020-08-05 DIAGNOSIS — L03119 Cellulitis of unspecified part of limb: Secondary | ICD-10-CM | POA: Diagnosis not present

## 2020-08-05 DIAGNOSIS — K5900 Constipation, unspecified: Secondary | ICD-10-CM | POA: Diagnosis not present

## 2020-08-05 DIAGNOSIS — I739 Peripheral vascular disease, unspecified: Secondary | ICD-10-CM | POA: Diagnosis not present

## 2020-08-05 DIAGNOSIS — G40909 Epilepsy, unspecified, not intractable, without status epilepticus: Secondary | ICD-10-CM | POA: Diagnosis not present

## 2020-08-05 DIAGNOSIS — R110 Nausea: Secondary | ICD-10-CM | POA: Diagnosis not present

## 2020-08-05 DIAGNOSIS — Z992 Dependence on renal dialysis: Secondary | ICD-10-CM | POA: Diagnosis not present

## 2020-08-05 DIAGNOSIS — Z7401 Bed confinement status: Secondary | ICD-10-CM | POA: Diagnosis not present

## 2020-08-16 DIAGNOSIS — Z992 Dependence on renal dialysis: Secondary | ICD-10-CM | POA: Diagnosis not present

## 2020-08-16 DIAGNOSIS — N186 End stage renal disease: Secondary | ICD-10-CM | POA: Diagnosis not present

## 2020-08-24 DIAGNOSIS — I120 Hypertensive chronic kidney disease with stage 5 chronic kidney disease or end stage renal disease: Secondary | ICD-10-CM | POA: Diagnosis not present

## 2020-08-24 DIAGNOSIS — E1151 Type 2 diabetes mellitus with diabetic peripheral angiopathy without gangrene: Secondary | ICD-10-CM | POA: Diagnosis not present

## 2020-08-24 DIAGNOSIS — D62 Acute posthemorrhagic anemia: Secondary | ICD-10-CM | POA: Diagnosis not present

## 2020-08-24 DIAGNOSIS — Z89511 Acquired absence of right leg below knee: Secondary | ICD-10-CM | POA: Diagnosis not present

## 2020-08-24 DIAGNOSIS — Z7901 Long term (current) use of anticoagulants: Secondary | ICD-10-CM | POA: Diagnosis not present

## 2020-08-24 DIAGNOSIS — Z992 Dependence on renal dialysis: Secondary | ICD-10-CM | POA: Diagnosis not present

## 2020-08-24 DIAGNOSIS — Z4781 Encounter for orthopedic aftercare following surgical amputation: Secondary | ICD-10-CM | POA: Diagnosis not present

## 2020-08-24 DIAGNOSIS — N186 End stage renal disease: Secondary | ICD-10-CM | POA: Diagnosis not present

## 2020-08-24 DIAGNOSIS — E1122 Type 2 diabetes mellitus with diabetic chronic kidney disease: Secondary | ICD-10-CM | POA: Diagnosis not present

## 2020-08-24 DIAGNOSIS — I4891 Unspecified atrial fibrillation: Secondary | ICD-10-CM | POA: Diagnosis not present

## 2020-08-24 DIAGNOSIS — E785 Hyperlipidemia, unspecified: Secondary | ICD-10-CM | POA: Diagnosis not present

## 2020-08-24 DIAGNOSIS — R569 Unspecified convulsions: Secondary | ICD-10-CM | POA: Diagnosis not present

## 2020-08-24 DIAGNOSIS — K5900 Constipation, unspecified: Secondary | ICD-10-CM | POA: Diagnosis not present

## 2020-08-24 DIAGNOSIS — Z89512 Acquired absence of left leg below knee: Secondary | ICD-10-CM | POA: Diagnosis not present

## 2020-08-25 DIAGNOSIS — N186 End stage renal disease: Secondary | ICD-10-CM | POA: Diagnosis not present

## 2020-08-25 DIAGNOSIS — Z89512 Acquired absence of left leg below knee: Secondary | ICD-10-CM | POA: Diagnosis not present

## 2020-08-25 DIAGNOSIS — F17290 Nicotine dependence, other tobacco product, uncomplicated: Secondary | ICD-10-CM | POA: Diagnosis not present

## 2020-08-25 DIAGNOSIS — D62 Acute posthemorrhagic anemia: Secondary | ICD-10-CM | POA: Diagnosis not present

## 2020-08-25 DIAGNOSIS — I120 Hypertensive chronic kidney disease with stage 5 chronic kidney disease or end stage renal disease: Secondary | ICD-10-CM | POA: Diagnosis not present

## 2020-08-25 DIAGNOSIS — Z86711 Personal history of pulmonary embolism: Secondary | ICD-10-CM | POA: Diagnosis not present

## 2020-08-25 DIAGNOSIS — R569 Unspecified convulsions: Secondary | ICD-10-CM | POA: Diagnosis not present

## 2020-08-25 DIAGNOSIS — Z4781 Encounter for orthopedic aftercare following surgical amputation: Secondary | ICD-10-CM | POA: Diagnosis not present

## 2020-08-25 DIAGNOSIS — E1122 Type 2 diabetes mellitus with diabetic chronic kidney disease: Secondary | ICD-10-CM | POA: Diagnosis not present

## 2020-08-25 DIAGNOSIS — Z79899 Other long term (current) drug therapy: Secondary | ICD-10-CM | POA: Diagnosis not present

## 2020-08-25 DIAGNOSIS — Z992 Dependence on renal dialysis: Secondary | ICD-10-CM | POA: Diagnosis not present

## 2020-08-30 DIAGNOSIS — I120 Hypertensive chronic kidney disease with stage 5 chronic kidney disease or end stage renal disease: Secondary | ICD-10-CM | POA: Diagnosis not present

## 2020-08-30 DIAGNOSIS — Z4781 Encounter for orthopedic aftercare following surgical amputation: Secondary | ICD-10-CM | POA: Diagnosis not present

## 2020-08-30 DIAGNOSIS — D62 Acute posthemorrhagic anemia: Secondary | ICD-10-CM | POA: Diagnosis not present

## 2020-08-30 DIAGNOSIS — N186 End stage renal disease: Secondary | ICD-10-CM | POA: Diagnosis not present

## 2020-08-30 DIAGNOSIS — E1122 Type 2 diabetes mellitus with diabetic chronic kidney disease: Secondary | ICD-10-CM | POA: Diagnosis not present

## 2020-08-30 DIAGNOSIS — Z89512 Acquired absence of left leg below knee: Secondary | ICD-10-CM | POA: Diagnosis not present

## 2020-09-01 DIAGNOSIS — N186 End stage renal disease: Secondary | ICD-10-CM | POA: Diagnosis not present

## 2020-09-01 DIAGNOSIS — Z4781 Encounter for orthopedic aftercare following surgical amputation: Secondary | ICD-10-CM | POA: Diagnosis not present

## 2020-09-01 DIAGNOSIS — Z89512 Acquired absence of left leg below knee: Secondary | ICD-10-CM | POA: Diagnosis not present

## 2020-09-01 DIAGNOSIS — E1122 Type 2 diabetes mellitus with diabetic chronic kidney disease: Secondary | ICD-10-CM | POA: Diagnosis not present

## 2020-09-01 DIAGNOSIS — I120 Hypertensive chronic kidney disease with stage 5 chronic kidney disease or end stage renal disease: Secondary | ICD-10-CM | POA: Diagnosis not present

## 2020-09-01 DIAGNOSIS — D62 Acute posthemorrhagic anemia: Secondary | ICD-10-CM | POA: Diagnosis not present

## 2020-09-06 DIAGNOSIS — Z89512 Acquired absence of left leg below knee: Secondary | ICD-10-CM | POA: Diagnosis not present

## 2020-09-06 DIAGNOSIS — I120 Hypertensive chronic kidney disease with stage 5 chronic kidney disease or end stage renal disease: Secondary | ICD-10-CM | POA: Diagnosis not present

## 2020-09-06 DIAGNOSIS — Z4781 Encounter for orthopedic aftercare following surgical amputation: Secondary | ICD-10-CM | POA: Diagnosis not present

## 2020-09-06 DIAGNOSIS — E1122 Type 2 diabetes mellitus with diabetic chronic kidney disease: Secondary | ICD-10-CM | POA: Diagnosis not present

## 2020-09-06 DIAGNOSIS — N186 End stage renal disease: Secondary | ICD-10-CM | POA: Diagnosis not present

## 2020-09-06 DIAGNOSIS — D62 Acute posthemorrhagic anemia: Secondary | ICD-10-CM | POA: Diagnosis not present

## 2020-09-07 DIAGNOSIS — Z89511 Acquired absence of right leg below knee: Secondary | ICD-10-CM | POA: Diagnosis not present

## 2020-09-07 DIAGNOSIS — Z89512 Acquired absence of left leg below knee: Secondary | ICD-10-CM | POA: Diagnosis not present

## 2020-09-07 DIAGNOSIS — Z4781 Encounter for orthopedic aftercare following surgical amputation: Secondary | ICD-10-CM | POA: Diagnosis not present

## 2020-09-08 DIAGNOSIS — L97929 Non-pressure chronic ulcer of unspecified part of left lower leg with unspecified severity: Secondary | ICD-10-CM | POA: Diagnosis not present

## 2020-09-08 DIAGNOSIS — T8189XA Other complications of procedures, not elsewhere classified, initial encounter: Secondary | ICD-10-CM | POA: Diagnosis not present

## 2020-09-08 DIAGNOSIS — I739 Peripheral vascular disease, unspecified: Secondary | ICD-10-CM | POA: Diagnosis not present

## 2020-09-08 DIAGNOSIS — Z89511 Acquired absence of right leg below knee: Secondary | ICD-10-CM | POA: Diagnosis not present

## 2020-09-08 DIAGNOSIS — I890 Lymphedema, not elsewhere classified: Secondary | ICD-10-CM | POA: Diagnosis not present

## 2020-09-08 DIAGNOSIS — Z79891 Long term (current) use of opiate analgesic: Secondary | ICD-10-CM | POA: Diagnosis not present

## 2020-09-08 DIAGNOSIS — Z89512 Acquired absence of left leg below knee: Secondary | ICD-10-CM | POA: Diagnosis not present

## 2020-09-13 DIAGNOSIS — Z89512 Acquired absence of left leg below knee: Secondary | ICD-10-CM | POA: Diagnosis not present

## 2020-09-13 DIAGNOSIS — I120 Hypertensive chronic kidney disease with stage 5 chronic kidney disease or end stage renal disease: Secondary | ICD-10-CM | POA: Diagnosis not present

## 2020-09-13 DIAGNOSIS — E1122 Type 2 diabetes mellitus with diabetic chronic kidney disease: Secondary | ICD-10-CM | POA: Diagnosis not present

## 2020-09-13 DIAGNOSIS — D62 Acute posthemorrhagic anemia: Secondary | ICD-10-CM | POA: Diagnosis not present

## 2020-09-13 DIAGNOSIS — N186 End stage renal disease: Secondary | ICD-10-CM | POA: Diagnosis not present

## 2020-09-13 DIAGNOSIS — Z4781 Encounter for orthopedic aftercare following surgical amputation: Secondary | ICD-10-CM | POA: Diagnosis not present

## 2020-09-15 DIAGNOSIS — Z992 Dependence on renal dialysis: Secondary | ICD-10-CM | POA: Diagnosis not present

## 2020-09-15 DIAGNOSIS — D62 Acute posthemorrhagic anemia: Secondary | ICD-10-CM | POA: Diagnosis not present

## 2020-09-15 DIAGNOSIS — N186 End stage renal disease: Secondary | ICD-10-CM | POA: Diagnosis not present

## 2020-09-15 DIAGNOSIS — E1122 Type 2 diabetes mellitus with diabetic chronic kidney disease: Secondary | ICD-10-CM | POA: Diagnosis not present

## 2020-09-15 DIAGNOSIS — Z4781 Encounter for orthopedic aftercare following surgical amputation: Secondary | ICD-10-CM | POA: Diagnosis not present

## 2020-09-15 DIAGNOSIS — Z89512 Acquired absence of left leg below knee: Secondary | ICD-10-CM | POA: Diagnosis not present

## 2020-09-15 DIAGNOSIS — I120 Hypertensive chronic kidney disease with stage 5 chronic kidney disease or end stage renal disease: Secondary | ICD-10-CM | POA: Diagnosis not present

## 2020-09-20 DIAGNOSIS — E1122 Type 2 diabetes mellitus with diabetic chronic kidney disease: Secondary | ICD-10-CM | POA: Diagnosis not present

## 2020-09-20 DIAGNOSIS — D62 Acute posthemorrhagic anemia: Secondary | ICD-10-CM | POA: Diagnosis not present

## 2020-09-20 DIAGNOSIS — Z4781 Encounter for orthopedic aftercare following surgical amputation: Secondary | ICD-10-CM | POA: Diagnosis not present

## 2020-09-20 DIAGNOSIS — N186 End stage renal disease: Secondary | ICD-10-CM | POA: Diagnosis not present

## 2020-09-20 DIAGNOSIS — Z89512 Acquired absence of left leg below knee: Secondary | ICD-10-CM | POA: Diagnosis not present

## 2020-09-20 DIAGNOSIS — I120 Hypertensive chronic kidney disease with stage 5 chronic kidney disease or end stage renal disease: Secondary | ICD-10-CM | POA: Diagnosis not present

## 2020-09-22 DIAGNOSIS — I120 Hypertensive chronic kidney disease with stage 5 chronic kidney disease or end stage renal disease: Secondary | ICD-10-CM | POA: Diagnosis not present

## 2020-09-22 DIAGNOSIS — Z4781 Encounter for orthopedic aftercare following surgical amputation: Secondary | ICD-10-CM | POA: Diagnosis not present

## 2020-09-22 DIAGNOSIS — E1122 Type 2 diabetes mellitus with diabetic chronic kidney disease: Secondary | ICD-10-CM | POA: Diagnosis not present

## 2020-09-22 DIAGNOSIS — D62 Acute posthemorrhagic anemia: Secondary | ICD-10-CM | POA: Diagnosis not present

## 2020-09-22 DIAGNOSIS — Z89512 Acquired absence of left leg below knee: Secondary | ICD-10-CM | POA: Diagnosis not present

## 2020-09-22 DIAGNOSIS — N186 End stage renal disease: Secondary | ICD-10-CM | POA: Diagnosis not present

## 2020-09-23 DIAGNOSIS — Z992 Dependence on renal dialysis: Secondary | ICD-10-CM | POA: Diagnosis not present

## 2020-09-23 DIAGNOSIS — N186 End stage renal disease: Secondary | ICD-10-CM | POA: Diagnosis not present

## 2020-09-23 DIAGNOSIS — I4891 Unspecified atrial fibrillation: Secondary | ICD-10-CM | POA: Diagnosis not present

## 2020-09-23 DIAGNOSIS — E1122 Type 2 diabetes mellitus with diabetic chronic kidney disease: Secondary | ICD-10-CM | POA: Diagnosis not present

## 2020-09-23 DIAGNOSIS — Z89512 Acquired absence of left leg below knee: Secondary | ICD-10-CM | POA: Diagnosis not present

## 2020-09-23 DIAGNOSIS — K5900 Constipation, unspecified: Secondary | ICD-10-CM | POA: Diagnosis not present

## 2020-09-23 DIAGNOSIS — I120 Hypertensive chronic kidney disease with stage 5 chronic kidney disease or end stage renal disease: Secondary | ICD-10-CM | POA: Diagnosis not present

## 2020-09-23 DIAGNOSIS — D62 Acute posthemorrhagic anemia: Secondary | ICD-10-CM | POA: Diagnosis not present

## 2020-09-23 DIAGNOSIS — Z7901 Long term (current) use of anticoagulants: Secondary | ICD-10-CM | POA: Diagnosis not present

## 2020-09-23 DIAGNOSIS — R569 Unspecified convulsions: Secondary | ICD-10-CM | POA: Diagnosis not present

## 2020-09-23 DIAGNOSIS — E1151 Type 2 diabetes mellitus with diabetic peripheral angiopathy without gangrene: Secondary | ICD-10-CM | POA: Diagnosis not present

## 2020-09-23 DIAGNOSIS — Z4781 Encounter for orthopedic aftercare following surgical amputation: Secondary | ICD-10-CM | POA: Diagnosis not present

## 2020-09-23 DIAGNOSIS — E785 Hyperlipidemia, unspecified: Secondary | ICD-10-CM | POA: Diagnosis not present

## 2020-09-23 DIAGNOSIS — Z89511 Acquired absence of right leg below knee: Secondary | ICD-10-CM | POA: Diagnosis not present

## 2020-09-24 DIAGNOSIS — N186 End stage renal disease: Secondary | ICD-10-CM | POA: Diagnosis not present

## 2020-09-24 DIAGNOSIS — Z89512 Acquired absence of left leg below knee: Secondary | ICD-10-CM | POA: Diagnosis not present

## 2020-09-24 DIAGNOSIS — E1122 Type 2 diabetes mellitus with diabetic chronic kidney disease: Secondary | ICD-10-CM | POA: Diagnosis not present

## 2020-09-24 DIAGNOSIS — Z4781 Encounter for orthopedic aftercare following surgical amputation: Secondary | ICD-10-CM | POA: Diagnosis not present

## 2020-09-24 DIAGNOSIS — I120 Hypertensive chronic kidney disease with stage 5 chronic kidney disease or end stage renal disease: Secondary | ICD-10-CM | POA: Diagnosis not present

## 2020-09-24 DIAGNOSIS — D62 Acute posthemorrhagic anemia: Secondary | ICD-10-CM | POA: Diagnosis not present

## 2020-09-27 DIAGNOSIS — Z4781 Encounter for orthopedic aftercare following surgical amputation: Secondary | ICD-10-CM | POA: Diagnosis not present

## 2020-09-27 DIAGNOSIS — E1122 Type 2 diabetes mellitus with diabetic chronic kidney disease: Secondary | ICD-10-CM | POA: Diagnosis not present

## 2020-09-27 DIAGNOSIS — I120 Hypertensive chronic kidney disease with stage 5 chronic kidney disease or end stage renal disease: Secondary | ICD-10-CM | POA: Diagnosis not present

## 2020-09-27 DIAGNOSIS — N186 End stage renal disease: Secondary | ICD-10-CM | POA: Diagnosis not present

## 2020-09-27 DIAGNOSIS — Z89512 Acquired absence of left leg below knee: Secondary | ICD-10-CM | POA: Diagnosis not present

## 2020-09-27 DIAGNOSIS — D62 Acute posthemorrhagic anemia: Secondary | ICD-10-CM | POA: Diagnosis not present

## 2020-09-29 DIAGNOSIS — N186 End stage renal disease: Secondary | ICD-10-CM | POA: Diagnosis not present

## 2020-09-29 DIAGNOSIS — E1122 Type 2 diabetes mellitus with diabetic chronic kidney disease: Secondary | ICD-10-CM | POA: Diagnosis not present

## 2020-09-29 DIAGNOSIS — D62 Acute posthemorrhagic anemia: Secondary | ICD-10-CM | POA: Diagnosis not present

## 2020-09-29 DIAGNOSIS — I120 Hypertensive chronic kidney disease with stage 5 chronic kidney disease or end stage renal disease: Secondary | ICD-10-CM | POA: Diagnosis not present

## 2020-09-29 DIAGNOSIS — Z4781 Encounter for orthopedic aftercare following surgical amputation: Secondary | ICD-10-CM | POA: Diagnosis not present

## 2020-09-29 DIAGNOSIS — Z89512 Acquired absence of left leg below knee: Secondary | ICD-10-CM | POA: Diagnosis not present

## 2020-10-03 DIAGNOSIS — Z4781 Encounter for orthopedic aftercare following surgical amputation: Secondary | ICD-10-CM | POA: Diagnosis not present

## 2020-10-03 DIAGNOSIS — Z89512 Acquired absence of left leg below knee: Secondary | ICD-10-CM | POA: Diagnosis not present

## 2020-10-03 DIAGNOSIS — I120 Hypertensive chronic kidney disease with stage 5 chronic kidney disease or end stage renal disease: Secondary | ICD-10-CM | POA: Diagnosis not present

## 2020-10-03 DIAGNOSIS — N186 End stage renal disease: Secondary | ICD-10-CM | POA: Diagnosis not present

## 2020-10-03 DIAGNOSIS — E1122 Type 2 diabetes mellitus with diabetic chronic kidney disease: Secondary | ICD-10-CM | POA: Diagnosis not present

## 2020-10-03 DIAGNOSIS — D62 Acute posthemorrhagic anemia: Secondary | ICD-10-CM | POA: Diagnosis not present

## 2020-10-06 DIAGNOSIS — N186 End stage renal disease: Secondary | ICD-10-CM | POA: Diagnosis not present

## 2020-10-06 DIAGNOSIS — Z79891 Long term (current) use of opiate analgesic: Secondary | ICD-10-CM | POA: Diagnosis not present

## 2020-10-06 DIAGNOSIS — L97929 Non-pressure chronic ulcer of unspecified part of left lower leg with unspecified severity: Secondary | ICD-10-CM | POA: Diagnosis not present

## 2020-10-06 DIAGNOSIS — T8189XD Other complications of procedures, not elsewhere classified, subsequent encounter: Secondary | ICD-10-CM | POA: Diagnosis not present

## 2020-10-06 DIAGNOSIS — E1151 Type 2 diabetes mellitus with diabetic peripheral angiopathy without gangrene: Secondary | ICD-10-CM | POA: Diagnosis not present

## 2020-10-06 DIAGNOSIS — I132 Hypertensive heart and chronic kidney disease with heart failure and with stage 5 chronic kidney disease, or end stage renal disease: Secondary | ICD-10-CM | POA: Diagnosis not present

## 2020-10-06 DIAGNOSIS — Z992 Dependence on renal dialysis: Secondary | ICD-10-CM | POA: Diagnosis not present

## 2020-10-06 DIAGNOSIS — I509 Heart failure, unspecified: Secondary | ICD-10-CM | POA: Diagnosis not present

## 2020-10-06 DIAGNOSIS — E1122 Type 2 diabetes mellitus with diabetic chronic kidney disease: Secondary | ICD-10-CM | POA: Diagnosis not present

## 2020-10-16 DIAGNOSIS — N186 End stage renal disease: Secondary | ICD-10-CM | POA: Diagnosis not present

## 2020-10-16 DIAGNOSIS — Z992 Dependence on renal dialysis: Secondary | ICD-10-CM | POA: Diagnosis not present

## 2020-11-15 DIAGNOSIS — M19032 Primary osteoarthritis, left wrist: Secondary | ICD-10-CM | POA: Diagnosis not present

## 2020-11-15 DIAGNOSIS — T8189XD Other complications of procedures, not elsewhere classified, subsequent encounter: Secondary | ICD-10-CM | POA: Diagnosis not present

## 2020-11-15 DIAGNOSIS — I890 Lymphedema, not elsewhere classified: Secondary | ICD-10-CM | POA: Diagnosis not present

## 2020-11-15 DIAGNOSIS — L97229 Non-pressure chronic ulcer of left calf with unspecified severity: Secondary | ICD-10-CM | POA: Diagnosis not present

## 2021-04-16 DEATH — deceased
